# Patient Record
Sex: Male | Born: 1940 | Race: White | Hispanic: No | Marital: Married | Smoking: Former smoker
Health system: Southern US, Community
[De-identification: ages and names within clinical notes are randomized; demographics above are authoritative.]

## PROBLEM LIST (undated history)

## (undated) DIAGNOSIS — R0600 Dyspnea, unspecified: Secondary | ICD-10-CM

## (undated) DIAGNOSIS — M1990 Unspecified osteoarthritis, unspecified site: Secondary | ICD-10-CM

## (undated) DIAGNOSIS — R238 Other skin changes: Secondary | ICD-10-CM

## (undated) DIAGNOSIS — C801 Malignant (primary) neoplasm, unspecified: Secondary | ICD-10-CM

## (undated) DIAGNOSIS — E785 Hyperlipidemia, unspecified: Secondary | ICD-10-CM

## (undated) DIAGNOSIS — Z8619 Personal history of other infectious and parasitic diseases: Secondary | ICD-10-CM

## (undated) DIAGNOSIS — J449 Chronic obstructive pulmonary disease, unspecified: Secondary | ICD-10-CM

## (undated) DIAGNOSIS — K5792 Diverticulitis of intestine, part unspecified, without perforation or abscess without bleeding: Secondary | ICD-10-CM

## (undated) DIAGNOSIS — R233 Spontaneous ecchymoses: Secondary | ICD-10-CM

## (undated) HISTORY — PX: TONSILLECTOMY: SUR1361

## (undated) HISTORY — DX: Diverticulitis of intestine, part unspecified, without perforation or abscess without bleeding: K57.92

## (undated) HISTORY — DX: Hyperlipidemia, unspecified: E78.5

## (undated) HISTORY — DX: Chronic obstructive pulmonary disease, unspecified: J44.9

## (undated) HISTORY — DX: Personal history of other infectious and parasitic diseases: Z86.19

## (undated) HISTORY — PX: WISDOM TOOTH EXTRACTION: SHX21

---

## 2005-04-06 ENCOUNTER — Ambulatory Visit: Payer: Self-pay

## 2009-12-04 ENCOUNTER — Inpatient Hospital Stay (HOSPITAL_COMMUNITY): Admission: EM | Admit: 2009-12-04 | Discharge: 2009-12-06 | Payer: Self-pay | Admitting: Emergency Medicine

## 2009-12-06 ENCOUNTER — Encounter (INDEPENDENT_AMBULATORY_CARE_PROVIDER_SITE_OTHER): Payer: Self-pay | Admitting: Internal Medicine

## 2009-12-06 ENCOUNTER — Ambulatory Visit: Payer: Self-pay | Admitting: Vascular Surgery

## 2010-11-23 LAB — BLOOD GAS, ARTERIAL
Acid-Base Excess: 2.9 mmol/L — ABNORMAL HIGH (ref 0.0–2.0)
O2 Saturation: 93.6 %
Patient temperature: 98.6
TCO2: 28.4 mmol/L (ref 0–100)
pH, Arterial: 7.419 (ref 7.350–7.450)
pO2, Arterial: 61.4 mmHg — ABNORMAL LOW (ref 80.0–100.0)

## 2010-11-23 LAB — DIFFERENTIAL
Basophils Absolute: 0 10*3/uL (ref 0.0–0.1)
Basophils Relative: 1 % (ref 0–1)
Eosinophils Absolute: 0.2 10*3/uL (ref 0.0–0.7)
Eosinophils Relative: 4 % (ref 0–5)
Lymphocytes Relative: 28 % (ref 12–46)
Lymphocytes Relative: 31 % (ref 12–46)
Lymphs Abs: 1.7 10*3/uL (ref 0.7–4.0)
Lymphs Abs: 1.8 10*3/uL (ref 0.7–4.0)
Monocytes Absolute: 0.7 10*3/uL (ref 0.1–1.0)
Monocytes Absolute: 0.8 10*3/uL (ref 0.1–1.0)
Monocytes Relative: 14 % — ABNORMAL HIGH (ref 3–12)
Neutro Abs: 3.6 10*3/uL (ref 1.7–7.7)
Neutrophils Relative %: 51 % (ref 43–77)
Neutrophils Relative %: 58 % (ref 43–77)

## 2010-11-23 LAB — COMPREHENSIVE METABOLIC PANEL
Alkaline Phosphatase: 97 U/L (ref 39–117)
CO2: 26 mEq/L (ref 19–32)
Total Bilirubin: 1 mg/dL (ref 0.3–1.2)
Total Protein: 7.9 g/dL (ref 6.0–8.3)

## 2010-11-23 LAB — CBC
HCT: 54 % — ABNORMAL HIGH (ref 39.0–52.0)
Hemoglobin: 16.5 g/dL (ref 13.0–17.0)
MCHC: 34.3 g/dL (ref 30.0–36.0)
MCV: 91.4 fL (ref 78.0–100.0)
Platelets: 249 10*3/uL (ref 150–400)
RBC: 5.93 MIL/uL — ABNORMAL HIGH (ref 4.22–5.81)
RDW: 13.6 % (ref 11.5–15.5)

## 2010-11-23 LAB — CK TOTAL AND CKMB (NOT AT ARMC): CK, MB: 2.7 ng/mL (ref 0.3–4.0)

## 2010-11-23 LAB — LEUKOCYTE ALKALINE PHOSPHATASE: Leukocyte Alkaline  Phos Stain: 128 — ABNORMAL HIGH (ref 22–124)

## 2010-11-23 LAB — APTT: aPTT: 34 seconds (ref 24–37)

## 2010-11-23 LAB — RAPID URINE DRUG SCREEN, HOSP PERFORMED
Opiates: NOT DETECTED
Tetrahydrocannabinol: NOT DETECTED

## 2011-06-28 LAB — PULMONARY FUNCTION TEST

## 2011-12-07 ENCOUNTER — Encounter: Payer: Self-pay | Admitting: Internal Medicine

## 2011-12-08 ENCOUNTER — Encounter: Payer: Self-pay | Admitting: Internal Medicine

## 2011-12-08 ENCOUNTER — Ambulatory Visit (INDEPENDENT_AMBULATORY_CARE_PROVIDER_SITE_OTHER)
Admission: RE | Admit: 2011-12-08 | Discharge: 2011-12-08 | Disposition: A | Discharge status: Home / Self Care | Payer: Medicare Other | Source: Ambulatory Visit | Attending: Internal Medicine | Admitting: Internal Medicine

## 2011-12-08 ENCOUNTER — Ambulatory Visit (INDEPENDENT_AMBULATORY_CARE_PROVIDER_SITE_OTHER): Payer: Medicare Other | Admitting: Internal Medicine

## 2011-12-08 VITALS — BP 142/70 | HR 62 | Temp 97.7°F | Ht 66.0 in | Wt 179.0 lb

## 2011-12-08 DIAGNOSIS — J449 Chronic obstructive pulmonary disease, unspecified: Secondary | ICD-10-CM | POA: Insufficient documentation

## 2011-12-08 DIAGNOSIS — R0609 Other forms of dyspnea: Secondary | ICD-10-CM

## 2011-12-08 DIAGNOSIS — R0989 Other specified symptoms and signs involving the circulatory and respiratory systems: Secondary | ICD-10-CM

## 2011-12-08 DIAGNOSIS — R0600 Dyspnea, unspecified: Secondary | ICD-10-CM

## 2011-12-08 NOTE — Assessment & Plan Note (Addendum)
-   Spirometry 06/18/11 FEV1 1.27 (52%) ratio 40   So he is a very symptomatic GOLD II who has already failed all the usual meds for copd  DDX of  difficult airways managment all start with A and  include Adherence, Ace Inhibitors, Acid Reflux, Active Sinus Disease, Alpha 1 Antitripsin deficiency, Anxiety masquerading as Airways dz,  ABPA,  allergy(esp in young), Aspiration (esp in elderly), Adverse effects of DPI,  Active smokers, plus two Bs  = Bronchiectasis and Beta blocker use..and one C= CHF  Adherence is always the initial "prime suspect" and is a multilayered concern that requires a "trust but verify" approach in every patient - starting with knowing how to use medications, especially inhalers, correctly, keeping up with refills and understanding the fundamental difference between maintenance and prns vs those medications only taken for a very short course and then stopped and not refilled.   ? Acid reflux > rx empirically for now then regroup and consider rehab vs cpst first

## 2011-12-08 NOTE — Progress Notes (Signed)
  Subjective:    Patient ID: Marc Flores, male    DOB: 26-Mar-1941  MRN: [id number]  HPI  69 yowm quit smoking 1992 on arrival to [place] at wt 150-160  from [place] with no trouble at all with respiratory problems and new onset sob 2006 referred by Dr Cyndia Bent for eval of sob 12/08/2011 to pulmonary clinic.   12/08/2011 1st pulmonary ov gradual worsening doe indolent onset progressively worsex 6 years to point where may be a couple hundred feet or up the driveway to the house and has to stop at top to rest,  and no better with dulera and spiriva or proare but assoc with audible wheezing, no overt hb.  Sleeping ok without nocturnal  or early am exacerbation  of respiratory  c/o's or need for noct saba. Also denies any obvious fluctuation of symptoms with weather or environmental changes or other aggravating or alleviating factors except as outlined above   Previous w/u by Wardell Heath for cough, denied sob per notes, with FEV1 1.27 on 06/28/11 with ratio 40  Review of Systems  Constitutional: Negative for fever and unexpected weight change.  HENT: Positive for sore throat and rhinorrhea. Negative for ear pain, nosebleeds, congestion, sneezing, trouble swallowing, dental problem, postnasal drip and sinus pressure.   Eyes: Negative for redness and itching.  Respiratory: Positive for shortness of breath and wheezing. Negative for cough and chest tightness.   Cardiovascular: Negative for palpitations and leg swelling.  Gastrointestinal: Negative for nausea and vomiting.  Genitourinary: Negative for dysuria.  Musculoskeletal: Negative for joint swelling.  Skin: Negative for rash.  Neurological: Negative for headaches.  Hematological: Does not bruise/bleed easily.  Psychiatric/Behavioral: Negative for dysphoric mood. The patient is not nervous/anxious.        Objective:   Physical Exam  Obese pleasant amb wm nad Wt 179 12/08/2011 HEENT mild turbinate edema.  Oropharynx no thrush or excess pnd or  cobblestoning.  No JVD or cervical adenopathy. Mild accessory muscle hypertrophy. Trachea midline, nl thryroid. Chest was hyperinflated by percussion with diminished breath sounds and mild increased exp time without wheeze. Hoover sign positive at end inspiration. Regular rate and rhythm without murmur gallop or rub or increase P2 or edema.  Abd: mod obeseno hsm, nl excursion. Ext warm without cyanosis or clubbing.    CXR  12/08/2011 :      Assessment & Plan:

## 2011-12-08 NOTE — Patient Instructions (Addendum)
Please remember to go to the lab and x-ray department downstairs for your tests - we will call you with the results when they are available.    We will request lung function tests from Dr Geoffry Paradise office  If not revealing will consider going ahead with a cpst - we will call to schedule  Try prilosec 20mg   Take 30-60 min before first meal of the day and Pepcid 20 mg one bedtime until  We complete your work up  GERD (REFLUX)  is an extremely common cause of respiratory symptoms, many times with no significant heartburn at all.    It can be treated with medication, but also with lifestyle changes including avoidance of late meals, excessive alcohol, smoking cessation, and avoid fatty foods, chocolate, peppermint, colas, red wine, and acidic juices such as orange juice.  NO MINT OR MENTHOL PRODUCTS SO NO COUGH DROPS  USE SUGARLESS CANDY INSTEAD (jolley ranchers or Stover's)  NO OIL BASED VITAMINS - use powdered substitutes.

## 2011-12-08 NOTE — Assessment & Plan Note (Addendum)
Followed in Pulmonary clinic/ [facility]/ Annel Zunker     - 12/08/2011  Walked RA x 3 laps @ 185 ft each stopped due to  End of study, though sats dropped to 89%

## 2011-12-12 ENCOUNTER — Telehealth: Payer: Self-pay | Admitting: Internal Medicine

## 2011-12-12 NOTE — Telephone Encounter (Signed)
Notes Recorded by Nyoka Cowden, MD on 12/08/2011 at 4:39 PM Call pt: Reviewed cxr and no acute change so no change in recommendations made at ov   Pt aware of results and no questions or concerns at this time.

## 2011-12-12 NOTE — Progress Notes (Signed)
Quick Note:  Pt aware of results and no questions or concerns at this time. ______ 

## 2015-12-07 DIAGNOSIS — Z131 Encounter for screening for diabetes mellitus: Secondary | ICD-10-CM | POA: Diagnosis not present

## 2015-12-07 DIAGNOSIS — Z125 Encounter for screening for malignant neoplasm of prostate: Secondary | ICD-10-CM | POA: Diagnosis not present

## 2015-12-07 DIAGNOSIS — Z0000 Encounter for general adult medical examination without abnormal findings: Secondary | ICD-10-CM | POA: Diagnosis not present

## 2015-12-07 DIAGNOSIS — R300 Dysuria: Secondary | ICD-10-CM | POA: Diagnosis not present

## 2015-12-07 DIAGNOSIS — Z23 Encounter for immunization: Secondary | ICD-10-CM | POA: Diagnosis not present

## 2015-12-07 DIAGNOSIS — Z136 Encounter for screening for cardiovascular disorders: Secondary | ICD-10-CM | POA: Diagnosis not present

## 2015-12-07 DIAGNOSIS — M109 Gout, unspecified: Secondary | ICD-10-CM | POA: Diagnosis not present

## 2016-02-11 DIAGNOSIS — E7800 Pure hypercholesterolemia, unspecified: Secondary | ICD-10-CM | POA: Diagnosis not present

## 2016-02-17 DIAGNOSIS — R309 Painful micturition, unspecified: Secondary | ICD-10-CM | POA: Diagnosis not present

## 2016-02-17 DIAGNOSIS — J069 Acute upper respiratory infection, unspecified: Secondary | ICD-10-CM | POA: Diagnosis not present

## 2016-02-17 DIAGNOSIS — R3912 Poor urinary stream: Secondary | ICD-10-CM | POA: Diagnosis not present

## 2016-02-23 DIAGNOSIS — N410 Acute prostatitis: Secondary | ICD-10-CM | POA: Diagnosis not present

## 2016-03-10 DIAGNOSIS — E871 Hypo-osmolality and hyponatremia: Secondary | ICD-10-CM | POA: Diagnosis not present

## 2016-11-16 DIAGNOSIS — H2513 Age-related nuclear cataract, bilateral: Secondary | ICD-10-CM | POA: Diagnosis not present

## 2016-11-16 DIAGNOSIS — H5203 Hypermetropia, bilateral: Secondary | ICD-10-CM | POA: Diagnosis not present

## 2016-12-15 DIAGNOSIS — R300 Dysuria: Secondary | ICD-10-CM | POA: Diagnosis not present

## 2016-12-15 DIAGNOSIS — R0602 Shortness of breath: Secondary | ICD-10-CM | POA: Diagnosis not present

## 2016-12-15 DIAGNOSIS — Z0000 Encounter for general adult medical examination without abnormal findings: Secondary | ICD-10-CM | POA: Diagnosis not present

## 2016-12-15 DIAGNOSIS — R3912 Poor urinary stream: Secondary | ICD-10-CM | POA: Diagnosis not present

## 2016-12-15 DIAGNOSIS — E785 Hyperlipidemia, unspecified: Secondary | ICD-10-CM | POA: Diagnosis not present

## 2016-12-15 DIAGNOSIS — R062 Wheezing: Secondary | ICD-10-CM | POA: Diagnosis not present

## 2016-12-18 ENCOUNTER — Other Ambulatory Visit: Payer: Self-pay | Admitting: Family Medicine

## 2016-12-18 ENCOUNTER — Telehealth: Payer: Self-pay

## 2016-12-18 ENCOUNTER — Ambulatory Visit
Admission: RE | Admit: 2016-12-18 | Discharge: 2016-12-18 | Disposition: A | Discharge status: Home / Self Care | Payer: PPO | Source: Ambulatory Visit | Attending: Family Medicine | Admitting: Family Medicine

## 2016-12-18 DIAGNOSIS — R0602 Shortness of breath: Secondary | ICD-10-CM | POA: Diagnosis not present

## 2016-12-18 DIAGNOSIS — R918 Other nonspecific abnormal finding of lung field: Secondary | ICD-10-CM | POA: Diagnosis not present

## 2016-12-18 DIAGNOSIS — K625 Hemorrhage of anus and rectum: Secondary | ICD-10-CM | POA: Diagnosis not present

## 2016-12-18 DIAGNOSIS — Z86010 Personal history of colonic polyps: Secondary | ICD-10-CM | POA: Diagnosis not present

## 2016-12-18 NOTE — Telephone Encounter (Signed)
SENT NOTES TO SCHEDULING 

## 2016-12-19 ENCOUNTER — Encounter: Payer: Self-pay | Admitting: Cardiovascular Disease

## 2016-12-19 NOTE — Progress Notes (Signed)
Cardiology Office Note   Date:  12/20/2016   ID:  Marc Flores, DOB Nov 06, 1940, MRN [id number]  PCP:  Chesley Noon, MD  Cardiologist:   Jenkins Rouge, MD   Chief Complaint  Patient presents with  . Shortness of Breath  . Establish Care      History of Present Illness: Marc Flores is a 76 y.o. male who presents for evaluation/consultation of exertional dyspnea.  Referred by Dr London Pepper. Reviewed office note from 12/15/16  Notes "heavy breathing" Previous smoker. Has occasional wheezing and congestion Can't work more than 2 minutes without getting winded. No chest pain. No edema Smoked a ppd from age 64 to Erma checked and albuterol given  NAD on CXR personally reviewed images   Labs reviewed LDL 101 CR 1.34  Hct 50.9   He has had marked change in his ambient dyspnea last 6 months. Cough and congestion. Walked in office and sats stayed above 90%.  No chest pain No palpitations although resting HR is tachycardic. No LE edema Clotting issues or history of PE   Past Medical History:  Diagnosis Date  . COPD (chronic obstructive pulmonary disease) ([place])     Past Surgical History:  Procedure Laterality Date  . TONSILLECTOMY    . WISDOM TOOTH EXTRACTION       Current Outpatient Prescriptions  Medication Sig Dispense Refill  . atorvastatin (LIPITOR) 10 MG tablet Take 10 mg by mouth daily.  6  . Mometasone Furo-Formoterol Fum (DULERA) 200-5 MCG/ACT AERO Inhale 2 puffs into the lungs 2 (two) times daily.    Marland Kitchen PROAIR HFA 108 (90 Base) MCG/ACT inhaler Inhale 2 puffs into the lungs as directed.  1  . tiotropium (SPIRIVA) 18 MCG inhalation capsule Place 18 mcg into inhaler and inhale daily.     No current facility-administered medications for this visit.     Allergies:   Patient has no known allergies.    Social History:  The patient  reports that he quit smoking about 25 years ago. His smoking use included Cigarettes. He has a 30.00 pack-year smoking  history. He has never used smokeless tobacco. He reports that he drinks alcohol. He reports that he does not use drugs.   Family History:  The patient's family history includes Atrial fibrillation in his father; Diabetes in his mother; Glaucoma in his mother; Heart disease in his father.    ROS:  Please see the history of present illness.   Otherwise, review of systems are positive for none.   All other systems are reviewed and negative.    PHYSICAL EXAM: VS:  BP (!) 150/64   Pulse (!) 107   Ht 5\' 7"  (1.702 m)   Wt 176 lb 6.4 oz (80 kg)   BMI 27.63 kg/m  , BMI Body mass index is 27.63 kg/m. Affect appropriate Overweight tachypnic male  HEENT: normal Neck supple with no adenopathy JVP normal no bruits no thyromegaly Lungs basilar velcro like crackles  no wheezing and good diaphragmatic motion Heart:  S1/S2 no murmur, no rub, gallop or click PMI normal Abdomen: benighn, BS positve, no tenderness, no AAA no bruit.  No HSM or HJR Distal pulses intact with no bruits No edema Neuro non-focal Skin warm and dry No muscular weakness    EKG:  ST rate 104 LAD ? Old IPMI    Recent Labs: No results found for requested labs within last 8760 hours.    Lipid Panel No results found for:  CHOL, TRIG, HDL, CHOLHDL, VLDL, LDLCALC, LDLDIRECT    Wt Readings from Last 3 Encounters:  12/20/16 176 lb 6.4 oz (80 kg)  12/08/11 179 lb (81.2 kg)      Other studies Reviewed: Additional studies/ records that were reviewed today include: Notes Dr Melvyn Novas PFTls 2015  Office notes ECG primary .    ASSESSMENT AND PLAN:  1. Dyspnea: Cardiac exam is normal. ECG abnormal with resting tachycardia. Will order echo to assess RV/LV function and assess PA pressures r/o pulmonary hypertension. He has known COPD. Concern on exam is for ILD or UIP. Velcro like crackles at base. Will start with hi resolution CT scan, refer back to Dr Melvyn Novas pulmonary and echo. If he has normal systolic function will need  PFTls, ABG and CTA or V/Q to r/o PE.  Relatively high Hct also speaks toward acute on chronic lung disease and not cardiac etiology  2. Elevated cholesterol  On statin labs with primary    Current medicines are reviewed at length with the patient today.  The patient does not have concerns regarding medicines.  The following changes have been made:  no change  Labs/ tests ordered today include: Echo  Hi Resolution CT Scan refer to pulmonary   Orders Placed This Encounter  Procedures  . CT CHEST HIGH RESOLUTION  . Ambulatory referral to Pulmonology  . EKG 12-Lead  . ECHOCARDIOGRAM COMPLETE     Disposition:   FU with me next available      Signed, Jenkins Rouge, MD  12/20/2016 4:33 PM    [facility] [address], [place], [place]  [address] Phone: [phone]; Fax: [phone]

## 2016-12-20 ENCOUNTER — Encounter: Payer: Self-pay | Admitting: Cardiovascular Disease

## 2016-12-20 ENCOUNTER — Ambulatory Visit (INDEPENDENT_AMBULATORY_CARE_PROVIDER_SITE_OTHER): Payer: PPO | Admitting: Cardiovascular Disease

## 2016-12-20 VITALS — BP 150/64 | HR 107 | Ht 67.0 in | Wt 176.4 lb

## 2016-12-20 DIAGNOSIS — R0602 Shortness of breath: Secondary | ICD-10-CM | POA: Diagnosis not present

## 2016-12-20 DIAGNOSIS — J849 Interstitial pulmonary disease, unspecified: Secondary | ICD-10-CM | POA: Diagnosis not present

## 2016-12-20 DIAGNOSIS — Z7689 Persons encountering health services in other specified circumstances: Secondary | ICD-10-CM | POA: Diagnosis not present

## 2016-12-20 NOTE — Patient Instructions (Addendum)
Medication Instructions:  Your physician recommends that you continue on your current medications as directed. Please refer to the Current Medication list given to you today.  Labwork: NONE  Testing/Procedures: Your physician has requested that you have an echocardiogram as soon as possible with in the next week. Echocardiography is a painless test that uses sound waves to create images of your heart. It provides your doctor with information about the size and shape of your heart and how well your heart's chambers and valves are working. This procedure takes approximately one hour. There are no restrictions for this procedure.  Chest CT scanning as soon as possible with in the next week, (CAT scanning), is a noninvasive, special x-ray that produces cross-sectional images of the body using x-rays and a computer. CT scans help physicians diagnose and treat medical conditions. For some CT exams, a contrast material is used to enhance visibility in the area of the body being studied. CT scans provide greater clarity and reveal more details than regular x-ray exams.  Follow-Up: You have been referred to Pulmonology with Dr. Melvyn Novas in the next two weeks.   Your physician wants you to follow-up in: 6 months with Dr. Johnsie Cancel. You will receive a reminder letter in the mail two months in advance. If you don't receive a letter, please call our office to schedule the follow-up appointment.   If you need a refill on your cardiac medications before your next appointment, please call your pharmacy.

## 2016-12-21 ENCOUNTER — Ambulatory Visit (HOSPITAL_COMMUNITY): Payer: PPO | Attending: Cardiology

## 2016-12-21 ENCOUNTER — Other Ambulatory Visit: Payer: Self-pay

## 2016-12-21 DIAGNOSIS — J449 Chronic obstructive pulmonary disease, unspecified: Secondary | ICD-10-CM | POA: Diagnosis not present

## 2016-12-21 DIAGNOSIS — Z87891 Personal history of nicotine dependence: Secondary | ICD-10-CM | POA: Diagnosis not present

## 2016-12-21 DIAGNOSIS — R0602 Shortness of breath: Secondary | ICD-10-CM | POA: Diagnosis not present

## 2016-12-21 LAB — ECHOCARDIOGRAM COMPLETE
AOASC: 31 cm
CHL CUP DOP CALC LVOT VTI: 16.4 cm
CHL CUP MV DEC (S): 349
EERAT: 4.89
EWDT: 349 ms
FS: 35 % (ref 28–44)
IV/PV OW: 1.11
LA diam end sys: 32 mm
LA vol: 36.7 mL
LADIAMINDEX: 1.67 cm/m2
LASIZE: 32 mm
LAVOLA4C: 31.6 mL
LAVOLIN: 19.1 mL/m2
LV E/e' medial: 4.89
LV E/e'average: 4.89
LV PW d: 9 mm — AB (ref 0.6–1.1)
LVELAT: 10.3 cm/s
LVOT SV: 51 mL
LVOT area: 3.14 cm2
LVOT diameter: 20 mm
LVOT peak vel: 85 cm/s
MV pk E vel: 50.4 m/s
MVAP: 2.16 cm2
MVPKAVEL: 76.2 m/s
MVSPHT: 102 ms
RV LATERAL S' VELOCITY: 8.6 cm/s
TAPSE: 19.5 mm
TDI e' lateral: 10.3
TDI e' medial: 8.19

## 2016-12-28 ENCOUNTER — Ambulatory Visit (INDEPENDENT_AMBULATORY_CARE_PROVIDER_SITE_OTHER)
Admission: RE | Admit: 2016-12-28 | Discharge: 2016-12-28 | Disposition: A | Discharge status: Home / Self Care | Payer: PPO | Source: Ambulatory Visit | Attending: Cardiovascular Disease | Admitting: Cardiovascular Disease

## 2016-12-28 ENCOUNTER — Telehealth: Payer: Self-pay

## 2016-12-28 DIAGNOSIS — J849 Interstitial pulmonary disease, unspecified: Secondary | ICD-10-CM | POA: Diagnosis not present

## 2016-12-28 DIAGNOSIS — R0602 Shortness of breath: Secondary | ICD-10-CM | POA: Diagnosis not present

## 2016-12-28 DIAGNOSIS — R9389 Abnormal findings on diagnostic imaging of other specified body structures: Secondary | ICD-10-CM

## 2016-12-28 NOTE — Telephone Encounter (Signed)
-----   Message from Josue Hector, MD sent at 12/28/2016 11:38 AM EDT ----- No ILD but exam abnormal and significant COPD on CT should have f/u with pulmonary has seen Wert before had him get lexiscan myovue and d dimer as his acute symptoms still not explained

## 2016-12-28 NOTE — Telephone Encounter (Signed)
Patient aware of results. Dr. Johnsie Cancel spoke with patient on the phone. Will order test. Patient will come in tomorrow for lab work. [facility] will call patient to schedule Myoview.

## 2016-12-29 ENCOUNTER — Encounter: Payer: Self-pay | Admitting: Internal Medicine

## 2016-12-29 ENCOUNTER — Ambulatory Visit (INDEPENDENT_AMBULATORY_CARE_PROVIDER_SITE_OTHER): Payer: PPO | Admitting: Internal Medicine

## 2016-12-29 ENCOUNTER — Other Ambulatory Visit: Payer: PPO

## 2016-12-29 ENCOUNTER — Other Ambulatory Visit (INDEPENDENT_AMBULATORY_CARE_PROVIDER_SITE_OTHER): Payer: PPO

## 2016-12-29 VITALS — BP 138/82 | HR 93 | Ht 63.5 in | Wt 177.0 lb

## 2016-12-29 DIAGNOSIS — R0609 Other forms of dyspnea: Secondary | ICD-10-CM

## 2016-12-29 DIAGNOSIS — J449 Chronic obstructive pulmonary disease, unspecified: Secondary | ICD-10-CM

## 2016-12-29 DIAGNOSIS — R0600 Dyspnea, unspecified: Secondary | ICD-10-CM

## 2016-12-29 DIAGNOSIS — J479 Bronchiectasis, uncomplicated: Secondary | ICD-10-CM | POA: Insufficient documentation

## 2016-12-29 LAB — BRAIN NATRIURETIC PEPTIDE: Pro B Natriuretic peptide (BNP): 20 pg/mL (ref 0.0–100.0)

## 2016-12-29 MED ORDER — GLYCOPYRROLATE-FORMOTEROL 9-4.8 MCG/ACT IN AERO: 2.0000 | INHALATION_SPRAY | Freq: Two times a day (BID) | RESPIRATORY_TRACT | 0 refills | Status: DC

## 2016-12-29 MED ORDER — AMOXICILLIN-POT CLAVULANATE 875-125 MG PO TABS: 1.0000 | ORAL_TABLET | Freq: Two times a day (BID) | ORAL | 0 refills | Status: AC

## 2016-12-29 NOTE — Assessment & Plan Note (Addendum)
-   Spirometry 06/18/11 FEV1 1.27 (52%) ratio 40  - Spirometry 12/29/2016  FEV1 0.78 (34%)  Ratio 37   - 12/29/2016  After extensive coaching HFA effectiveness =    50% from baseline of 25% rec trial of bevesopi 2bid   Despite smoking cessation and absence of wt gain he has had significant decline with symptoms now difficult to control.  DDX of  difficult airways management almost all start with A and  include Adherence, Ace Inhibitors, Acid Reflux, Active Sinus Disease, Alpha 1 Antitripsin deficiency, Anxiety masquerading as Airways dz,  ABPA,  Allergy(esp in young), Aspiration (esp in elderly), Adverse effects of meds,  Active smokers, A bunch of PE's (a small clot burden can't cause this syndrome unless there is already severe underlying pulm or vascular dz with poor reserve) plus two Bs  = Bronchiectasis and Beta blocker use..and one C= CHF    Adherence is always the initial "prime suspect" and is a multilayered concern that requires a "trust but verify" approach in every patient - starting with knowing how to use medications, especially inhalers, correctly, keeping up with refills and understanding the fundamental difference between maintenance and prns vs those medications only taken for a very short course and then stopped and not refilled.  - see hfa teaching - return with all meds in hand using a trust but verify approach to confirm accurate Medication  Reconciliation The principal here is that until we are certain that the  patients are doing what we've asked, it makes no sense to ask them to do more.    ? Acid (or non-acid) GERD > always difficult to exclude as up to 75% of pts in some series report no assoc GI/ Heartburn symptoms> rec max (24h)  acid suppression and diet restrictions/ reviewed and instructions given in writing.  ? Active sinus dz > augmentin then sinus ct to follow if mucus still green    ? Allergy/ asthma > doubt so ok to rx with laba/lama trial for now and if flares on  not improving change to lama/laba/ics next ov and in meantime send profile    ? Alpha one AT def > send phenotype/ levels  ? A bunch of PE's > D dimer nl - while  A nl valute  may miss small peripheral pe, the clot burden with sob is moderately high and the d dimer has a very high neg pred value in this setting    ? Bronchiectasis confirmed on CT > rx augmentin/ check alpha one status ? Add flutter next     ? chf > excluded with bnp so low   Total time devoted to counseling  > 50 % of re-establish 60 min office visit:  review case with pt/ discussion of options/alternatives/ personally creating written customized instructions  in presence of pt  then going over those specific  Instructions directly with the pt including how to use all of the meds but in particular covering each new medication in detail and the difference between the maintenance= "automatic" meds and the prns using an action plan format for the latter (If this problem/symptom => do that organization reading Left to right).  Please see AVS from this visit for a full list of these instructions which I personally wrote for this pt and  are unique to this visit.

## 2016-12-29 NOTE — Patient Instructions (Addendum)
Plan A = Automatic = Bevespi Take 2 puffs first thing in am and then another 2 puffs about 12 hours later.  Also start taking  prilosec otc 20mg   Take 30-60 min before first meal of the day and Pepcid ac (famotidine) 20 mg one @  bedtime until  Return        Work on inhaler technique:  relax and gently blow all the way out then take a nice smooth deep breath back in, triggering the inhaler at same time you start breathing in.  Hold for up to 5 seconds if you can. Blow out thru nose. Rinse and gargle with water when done      Plan B = Backup Only use your albuterol as a rescue medication to be used if you can't catch your breath by resting or doing a relaxed purse lip breathing pattern.  - The less you use it, the better it will work when you need it. - Ok to use the inhaler up to 2 puffs  every 4 hours if you must but call for appointment if use goes up over your usual need - Don't leave home without it !!  (think of it like the spare tire for your car)    Augmentin 875 mg take one pill twice daily  X 10 days - take at breakfast and supper with large glass of water.  It would help reduce the usual side effects (diarrhea and yeast infections) if you ate cultured yogurt at lunch.   Please remember to go to the lab  department downstairs in the basement  for your tests - we will call you with the results when they are available.    Please schedule a follow up office visit in 4 weeks, sooner if needed  with all medications /inhalers/ solutions in hand so we can verify exactly what you are taking. This includes all medications from all doctors and over the counters

## 2016-12-29 NOTE — Progress Notes (Signed)
Subjective:    Patient ID: Marc Flores, male    DOB: [birth year]  MRN: [id number]    Brief patient profile:  76yowm quit smoking 1992 on arrival to [place] at wt 150-160  from [place] with no trouble at all with respiratory problems and new onset sob 2006 referred by Dr Melford Aase for eval of sob 12/08/2011 to pulmonary clinic with confirmed GOLD II copd by spirometry on 06/2011 pre rx by  [facility] chest     History of Present Illness  12/08/2011 1st pulmonary ov gradual worsening doe indolent onset progressively worsex 6 years to point where may be a couple hundred feet or up the driveway to the house and has to stop at top to rest,  and no better with dulera and spiriva or proare but assoc with audible wheezing, no overt hb. rec If not revealing will consider going ahead with a cpst - we will call to schedule Try prilosec 20mg   Take 30-60 min before first meal of the day and Pepcid 20 mg one bedtime until  We complete your work up GERD diet     12/29/2016  f/u ov/Pariss Hommes re:  Girtha Rm II copd/ worse sob with new dx of bronchiectasis  Chief Complaint  Patient presents with  . Pulmonary Consult    Referred by Dr. Johnsie Cancel. Pt c/o DOE with exertion such as walking up hills and carrying out the garbage. He states he has had SOB for years, but this has been worse for 6 months. He also c/o prod cough with light green sputum.     all better / able to get up driveway fine p last pulmonary eval and eventually stopped the gerd rx got worse under [facility] f/u > pulmonary eval [facility] dx COPD rec inhalers > better transiently but worse x 6 m to point where can't get up same driveway without stopping half way - saba still helps some  Mucus slt green x 6 m x 10 min each am ssoc with some pnds  Watery nasal discharge x years        No obvious day to day or daytime variability or assoc  mucus plugs or hemoptysis or cp or chest tightness, subjective wheeze or overt sinus or hb symptoms. No unusual exp hx or h/o childhood pna/ asthma  or knowledge of premature birth.  Sleeping ok without nocturnal  or early am exacerbation  of respiratory  c/o's or need for noct saba. Also denies any obvious fluctuation of symptoms with weather or environmental changes or other aggravating or alleviating factors except as outlined above   Current Medications, Allergies, Complete Past Medical History, Past Surgical History, Family History, and Social History were reviewed in [employer] record.  ROS  The following are not active complaints unless bolded sore throat, dysphagia, dental problems, itching, sneezing,  nasal congestion or excess/ purulent secretions, ear ache,   fever, chills, sweats, unintended wt loss, classically pleuritic or exertional cp,  orthopnea pnd or leg swelling, presyncope, palpitations, abdominal pain, anorexia, nausea, vomiting, diarrhea  or change in bowel or bladder habits, change in stools or urine, dysuria,hematuria,  rash, arthralgias, visual complaints, headache, numbness, weakness or ataxia or problems with walking or coordination,  change in mood/affect or memory.                  Objective:   Physical Exam  Obese pleasant amb wm nad  Wt 179 12/08/2011 Wt Readings from Last 3 Encounters:  12/29/16 177 lb (80.3 kg)  12/20/16 176 lb 6.4 oz (80 kg)  12/08/11 179 lb (81.2 kg)    Vital signs reviewed - Note on arrival 02 sats  93% on RA    HEENT: nl dentition, and oropharynx. Nl external ear canals without cough reflex - moderate bilateral L > Rnon-specific turbinate edema     NECK :  without JVD/Nodes/TM/ nl carotid upstrokes bilaterally   LUNGS: no acc muscle use,  Nl contour chest with minimal insp and exp rhonchi bilaterally  CV:  RRR  no s3 or murmur or increase in P2, and no edema   ABD:  soft and nontender with pos hoovers' mid insp  in the supine position. No bruits or organomegaly appreciated, bowel sounds nl  MS:  Nl gait/ ext warm without deformities, calf  tenderness, cyanosis or clubbing No obvious joint restrictions   SKIN: warm and dry without lesions    NEURO:  alert, approp, nl sensorium with  no motor or cerebellar deficits apparent.      I personally reviewed images and agree with radiology impression as follows:  CT chest 12/28/16 1. No evidence of interstitial lung disease. 2. Diffuse bronchial wall thickening with moderate centrilobular and mild paraseptal emphysema; imaging findings compatible with the reported clinical history of COPD. 3. 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   Labs ordered/ reviewed:      Chemistry      Component Value Date/Time   NA 139 12/04/2009 1144   K 4.1 12/04/2009 1144   CL 103 12/04/2009 1144   CO2 26 12/04/2009 1144   BUN 11 12/04/2009 1144   CREATININE 1.10 12/04/2009 1144      Component Value Date/Time   CALCIUM 9.4 12/04/2009 1144   ALKPHOS 97 12/04/2009 1144   AST 27 12/04/2009 1144   ALT 32 12/04/2009 1144   BILITOT 1.0 12/04/2009 1144        Lab Results  Component Value Date   WBC 5.9 12/05/2009   HGB 16.5 12/05/2009   HCT 48.8 12/05/2009   MCV 91.4 12/05/2009   PLT 249 12/05/2009     Lab Results  Component Value Date   DDIMER 0.42 12/29/2016          Lab Results  Component Value Date   PROBNP 20.0 12/29/2016               Assessment & Plan:

## 2016-12-30 LAB — D-DIMER, QUANTITATIVE (NOT AT ARMC): D DIMER QUANT: 0.42 ug{FEU}/mL (ref <0.50)

## 2016-12-30 NOTE — Assessment & Plan Note (Signed)
No evidence anemia/ chf > see copd

## 2017-01-01 LAB — ALPHA-1-ANTITRYPSIN: A-1 Antitrypsin, Ser: 149 mg/dL (ref 83–199)

## 2017-01-01 LAB — RESPIRATORY ALLERGY PROFILE REGION II ~~LOC~~
Allergen, A. alternata, m6: <0.1 kU/L
Allergen, C. Herbarum, M2: <0.1 kU/L
Allergen, D pternoyssinus,d7: <0.1 kU/L
Allergen, Mulberry, t76: <0.1 kU/L
Aspergillus fumigatus, m3: <0.1 kU/L
Cockroach: <0.1 kU/L
Common Ragweed: <0.1 kU/L
D. farinae: <0.1 kU/L
IGE (IMMUNOGLOBULIN E), SERUM: 16 kU/L (ref <115)
Johnson Grass: <0.1 kU/L
Rough Pigweed  IgE: <0.1 kU/L
Sheep Sorrel IgE: <0.1 kU/L

## 2017-01-04 LAB — ALPHA-1 ANTITRYPSIN PHENOTYPE: A-1 Antitrypsin: 152 mg/dL (ref 83–199)

## 2017-01-05 ENCOUNTER — Telehealth: Payer: Self-pay | Admitting: Internal Medicine

## 2017-01-05 NOTE — Telephone Encounter (Signed)
Call patient : Studies are unremarkable, no change in recs  Called and spoke with pts wife and she is aware of recs. Nothing further is needed.

## 2017-01-05 NOTE — Progress Notes (Signed)
LMTCB

## 2017-01-09 ENCOUNTER — Telehealth (HOSPITAL_COMMUNITY): Payer: Self-pay | Admitting: *Deleted

## 2017-01-09 NOTE — Telephone Encounter (Signed)
Attempted to call patient to review instructions for upcoming appointment- patient stated that he will call back tomorrow to review instructions.  Kirstie Peri

## 2017-01-09 NOTE — Progress Notes (Signed)
See 01/05/17 PN

## 2017-01-12 ENCOUNTER — Ambulatory Visit (HOSPITAL_COMMUNITY): Payer: PPO | Attending: Cardiology

## 2017-01-12 DIAGNOSIS — R9389 Abnormal findings on diagnostic imaging of other specified body structures: Secondary | ICD-10-CM

## 2017-01-12 DIAGNOSIS — R938 Abnormal findings on diagnostic imaging of other specified body structures: Secondary | ICD-10-CM

## 2017-01-12 DIAGNOSIS — R0602 Shortness of breath: Secondary | ICD-10-CM

## 2017-01-12 DIAGNOSIS — J449 Chronic obstructive pulmonary disease, unspecified: Secondary | ICD-10-CM | POA: Insufficient documentation

## 2017-01-12 MED ORDER — TECHNETIUM TC 99M TETROFOSMIN IV KIT
10.2000 | PACK | Freq: Once | INTRAVENOUS | Status: AC | PRN
  Administered 2017-01-12: 10.2 via INTRAVENOUS
  Filled 2017-01-12: qty 11

## 2017-01-12 MED ORDER — REGADENOSON 0.4 MG/5ML IV SOLN
0.4000 mg | Freq: Once | INTRAVENOUS | Status: AC
  Administered 2017-01-12: 0.4 mg via INTRAVENOUS

## 2017-01-12 MED ORDER — TECHNETIUM TC 99M TETROFOSMIN IV KIT
31.0000 | PACK | Freq: Once | INTRAVENOUS | Status: AC | PRN
  Administered 2017-01-12: 31 via INTRAVENOUS
  Filled 2017-01-12: qty 31

## 2017-01-15 LAB — MYOCARDIAL PERFUSION IMAGING
CHL CUP NUCLEAR SDS: 0
CHL CUP NUCLEAR SRS: 1
CHL CUP RESTING HR STRESS: 66 {beats}/min
CSEPPHR: 101 {beats}/min
LV dias vol: 69 mL (ref 62–150)
LVSYSVOL: 25 mL
RATE: 0.22
SSS: 1
TID: 0.93

## 2017-01-16 DIAGNOSIS — N289 Disorder of kidney and ureter, unspecified: Secondary | ICD-10-CM | POA: Diagnosis not present

## 2017-01-26 ENCOUNTER — Ambulatory Visit (INDEPENDENT_AMBULATORY_CARE_PROVIDER_SITE_OTHER): Payer: PPO | Admitting: Internal Medicine

## 2017-01-26 ENCOUNTER — Encounter: Payer: Self-pay | Admitting: Internal Medicine

## 2017-01-26 VITALS — BP 122/70 | HR 75 | Ht 63.5 in | Wt 176.0 lb

## 2017-01-26 DIAGNOSIS — J479 Bronchiectasis, uncomplicated: Secondary | ICD-10-CM

## 2017-01-26 DIAGNOSIS — J449 Chronic obstructive pulmonary disease, unspecified: Secondary | ICD-10-CM | POA: Diagnosis not present

## 2017-01-26 MED ORDER — GLYCOPYRROLATE-FORMOTEROL 9-4.8 MCG/ACT IN AERO
2.0000 | INHALATION_SPRAY | Freq: Two times a day (BID) | RESPIRATORY_TRACT | 0 refills | Status: DC
Start: 2017-01-26 — End: 2017-01-26

## 2017-01-26 MED ORDER — GLYCOPYRROLATE-FORMOTEROL 9-4.8 MCG/ACT IN AERO
2.0000 | INHALATION_SPRAY | Freq: Two times a day (BID) | RESPIRATORY_TRACT | 11 refills | Status: DC
Start: 2017-01-26 — End: 2017-01-26

## 2017-01-26 MED ORDER — GLYCOPYRROLATE-FORMOTEROL 9-4.8 MCG/ACT IN AERO: 2.0000 | INHALATION_SPRAY | Freq: Two times a day (BID) | RESPIRATORY_TRACT | 11 refills | Status: DC

## 2017-01-26 NOTE — Progress Notes (Signed)
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 rec If not revealing will consider going ahead with a cpst - we will call to schedule Try prilosec 20mg   Take 30-60 min before first meal of the day and Pepcid 20 mg one bedtime until  We complete your work up GERD diet     12/29/2016  f/u ov/Gracielynn Birkel re:  Girtha Rm II copd/ worse sob with new dx of bronchiectasis  Chief Complaint  Patient presents with  . Pulmonary Consult    Referred by Dr. Johnsie Cancel. Pt c/o DOE with exertion such as walking up hills and carrying out the garbage. He states he has had SOB for years, but this has been worse for 6 months. He also c/o prod cough with light green sputum.    all better / able to get up driveway fine p last pulmonary eval and eventually stopped the gerd rx got worse under [facility] f/u > pulmonary eval [facility] dx COPD rec inhalers > better transiently but worse x 6 m to point where can't get up same driveway without stopping half way - saba still helps some Mucus slt green x 6 m x 10 min each am ssoc with some pnds  Watery nasal discharge x years        01/26/2017  f/u ov/Devota Viruet re:  GOLD II copd/ bronchiectasis on bevespi 2bid/ rare saba  On otc gerd rx  Chief Complaint  Patient presents with  . Follow-up    Breathing has improved some but not back to  baseline. His cough had resolved and then returned after finished abx- prod with beige sputum.   mailbox to house s stopping now due to sob  No am exacs and sleeps fine  No obvious day to day or daytime variability or assoc excess/ purulent sputum or mucus plugs or hemoptysis or cp or chest tightness, subjective wheeze or overt sinus or hb symptoms. No unusual exp hx or h/o childhood pna/ asthma or knowledge of premature birth.  Sleeping ok without nocturnal  or early am exacerbation  of respiratory  c/o's or need for noct saba. Also denies any obvious fluctuation of symptoms with weather or environmental changes or other aggravating or alleviating factors except as outlined above   Current Medications, Allergies, Complete Past Medical History, Past Surgical History, Family History, and Social History were reviewed in [employer] record.  ROS  The following are not active complaints unless bolded sore throat, dysphagia, dental problems, itching, sneezing,  nasal congestion or excess/ purulent secretions, ear ache,   fever, chills, sweats, unintended wt loss, classically pleuritic or exertional cp,  orthopnea pnd or leg swelling, presyncope, palpitations, abdominal pain, anorexia, nausea, vomiting, diarrhea  or  change in bowel or bladder habits, change in stools or urine, dysuria,hematuria,  rash, arthralgias, visual complaints, headache, numbness, weakness or ataxia or problems with walking or coordination,  change in mood/affect or memory.        .            Objective:   Physical Exam  Obese pleasant amb wm nad    01/26/2017       176  12/29/16 177 lb (80.3 kg)  12/20/16 176 lb 6.4 oz (80 kg)  12/08/11 179 lb (81.2 kg)   Wt 179 12/08/2011    Vital signs reviewed - Note on arrival 02 sats  93% on RA    HEENT: nl dentition, and oropharynx. Nl external ear canals without cough reflex - moderate bilateral L > Rnon-specific turbinate edema     NECK :   without JVD/Nodes/TM/ nl carotid upstrokes bilaterally   LUNGS: no acc muscle use,  Nl contour chest with  Distant bs bilaterally  but no significant wheezes or rhonchi  CV:  RRR  no s3 or murmur or increase in P2, and no edema   ABD:  soft and nontender with pos hoovers' mid insp  in the supine position. No bruits or organomegaly appreciated, bowel sounds nl  MS:  Nl gait/ ext warm without deformities, calf tenderness, cyanosis or clubbing No obvious joint restrictions   SKIN: warm and dry without lesions    NEURO:  alert, approp, nl sensorium with  no motor or cerebellar deficits apparent.      I personally reviewed images and agree with radiology impression as follows:  CT chest 12/28/16 1. No evidence of interstitial lung disease. 2. Diffuse bronchial wall thickening with moderate centrilobular and mild paraseptal emphysema; imaging findings compatible with the reported clinical history of COPD. 3. 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            Assessment & Plan:

## 2017-01-26 NOTE — Assessment & Plan Note (Signed)
-   Spirometry 06/18/11 FEV1 1.27 (52%) ratio 40  - Spirometry 12/29/2016  FEV1 0.78 (34%)  Ratio 37  No saba first  - 12/29/2016  After extensive coaching HFA effectiveness =    50% from baseline of 50% rec trial of bevespi 2bid  - alpha one AT screen 12/29/16 >  MM - Allergy profile  12/29/16  >    IgE  16  RAST neg  - 01/26/2017  After extensive coaching HFA effectiveness =  50% from a baseline 25%    Much better symptom control despite suboptimal hfa but pt happy with present rx so no need to change it  I had an extended final summary discussion with the patient reviewing all relevant studies completed to date and  lasting 15 to 20 minutes of a 25 minute visit on the following issues:    1) pathophysiology of bronchiectasis reviewed (see separate a/p)   2) Formulary restrictions will be an ongoing challenge for the forseable future and I would be happy to pick an alternative if the pt will first  provide me a list of them but pt  will need to return here for training for any new device that is required eg dpi vs hfa vs respimat.    In meantime we can always provide samples so the patient never runs out of any needed respiratory medications.    3) Each maintenance medication was reviewed in detail including most importantly the difference between maintenance and as needed and under what circumstances the prns are to be used.  Please see AVS for specific  Instructions which are unique to this visit and I personally typed out  which were reviewed in detail in writing with the patient and a copy provided.   4) needs to keep up with vaccinations per dr Orland Mustard   pulmonary f/u can be prn

## 2017-01-26 NOTE — Assessment & Plan Note (Signed)
CT chest 12/28/16 1. No evidence of interstitial lung disease. 2. Diffuse bronchial wall thickening with moderate centrilobular and mild paraseptal emphysema; imaging findings compatible with the reported clinical history of COPD. 3. 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  rx mucinex or mucinex dm prn flare > see avs for instructions unique to this ov

## 2017-01-26 NOTE — Patient Instructions (Addendum)
Bronchiectasis =   you have scarring of your bronchial tubes which means that they don't function perfectly normally and mucus tends to pool in certain areas of your lung which can cause pneumonia and further scarring of your lung and bronchial tubes  Whenever you develop cough congestion take mucinex or mucinex dm  Up to 1200 mg every 12 hours as needed > these will help keep the mucus loose and flowing but if your condition worsens you need to seek help immediately preferably here or somewhere inside the [facility] system to compare xrays ( worse = darker or bloody mucus or pain on breathing in)     Plan A = Automatic =  Bevespi Take 2 puffs first thing in am and then another 2 puffs about 12 hours later.   Work on inhaler technique:  relax and gently blow all the way out then take a nice smooth deep breath back in, triggering the inhaler at same time you start breathing in.  Hold for up to 5 seconds if you can. Blow out thru nose. Rinse and gargle with water when done    Plan B = Backup Only use your albuterol as a rescue medication to be used if you can't catch your breath by resting or doing a relaxed purse lip breathing pattern.  - The less you use it, the better it will work when you need it. - Ok to use the inhaler up to 2 puffs  every 4 hours if you must but call for appointment if use goes up over your usual need - Don't leave home without it !!  (think of it like the spare tire for your car)   Ok to taper the acid suppression and just take the pepcid at bedtime as needed but at the first sign of a flare of respiratory symptoms remember Try prilosec otc 20mg   Take 30-60 min before first meal of the day and Pepcid ac (famotidine) 20 mg one @  bedtime until cough is completely gone for at least a week without the need for cough suppression    If not happy for any reason you need to return here with the insurance formulary list of alternatives for respiratory      If you are satisfied with  your treatment plan,  let your doctor know and he/she can either refill your medications or you can return here when your prescription runs out.     If in any way you are not 100% satisfied,  please tell us.  If 100% better, tell your friends!  Pulmonary follow up is as needed

## 2017-01-31 DIAGNOSIS — R0602 Shortness of breath: Secondary | ICD-10-CM | POA: Diagnosis not present

## 2017-02-05 ENCOUNTER — Telehealth: Payer: Self-pay | Admitting: Internal Medicine

## 2017-02-05 MED ORDER — BUDESONIDE-FORMOTEROL FUMARATE 160-4.5 MCG/ACT IN AERO
2.0000 | INHALATION_SPRAY | Freq: Two times a day (BID) | RESPIRATORY_TRACT | 3 refills | Status: DC
Start: 2017-02-05 — End: 2017-08-21

## 2017-02-05 NOTE — Telephone Encounter (Signed)
Spoke with pt, who states bevespi is not a covered medication. Covered alternatives are symbicort and advair.   MW please advise. Thanks.   12/29/16  Plan A = Automatic = Bevespi Take 2 puffs first thing in am and then another 2 puffs about 12 hours later.  Also start taking  prilosec otc 20mg   Take 30-60 min before first meal of the day and Pepcid ac (famotidine) 20 mg one @  bedtime until  Return        Work on inhaler technique:  relax and gently blow all the way out then take a nice smooth deep breath back in, triggering the inhaler at same time you start breathing in.  Hold for up to 5 seconds if you can. Blow out thru nose. Rinse and gargle with water when done      Plan B = Backup Only use your albuterol as a rescue medication to be used if you can't catch your breath by resting or doing a relaxed purse lip breathing pattern.  - The less you use it, the better it will work when you need it. - Ok to use the inhaler up to 2 puffs  every 4 hours if you must but call for appointment if use goes up over your usual need - Don't leave home without it !!  (think of it like the spare tire for your car)    Augmentin 875 mg take one pill twice daily  X 10 days - take at breakfast and supper with large glass of water.  It would help reduce the usual side effects (diarrhea and yeast infections) if you ate cultured yogurt at lunch.   Please remember to go to the lab  department downstairs in the basement  for your tests - we will call you with the results when they are available.    Please schedule a follow up office visit in 4 weeks, sooner if needed  with all medications /inhalers/ solutions in hand so we can verify exactly what you are taking. This includes all medications from all doctors and over the counters

## 2017-02-05 NOTE — Telephone Encounter (Signed)
Rx has been sent to preferred pharmacy. Pt is aware and voiced his understanding. Nothing further needed.  

## 2017-02-05 NOTE — Telephone Encounter (Signed)
symbicort 160 2bid but when returns needs to bring his forumulary if not entirely  happy with symbicort

## 2017-02-06 DIAGNOSIS — Z86010 Personal history of colonic polyps: Secondary | ICD-10-CM | POA: Diagnosis not present

## 2017-02-06 DIAGNOSIS — D123 Benign neoplasm of transverse colon: Secondary | ICD-10-CM | POA: Diagnosis not present

## 2017-02-06 DIAGNOSIS — D128 Benign neoplasm of rectum: Secondary | ICD-10-CM | POA: Diagnosis not present

## 2017-02-06 DIAGNOSIS — K5900 Constipation, unspecified: Secondary | ICD-10-CM | POA: Diagnosis not present

## 2017-02-06 DIAGNOSIS — K635 Polyp of colon: Secondary | ICD-10-CM | POA: Diagnosis not present

## 2017-02-06 DIAGNOSIS — D122 Benign neoplasm of ascending colon: Secondary | ICD-10-CM | POA: Diagnosis not present

## 2017-02-06 DIAGNOSIS — D124 Benign neoplasm of descending colon: Secondary | ICD-10-CM | POA: Diagnosis not present

## 2017-08-21 ENCOUNTER — Other Ambulatory Visit: Payer: Self-pay | Admitting: Internal Medicine

## 2017-11-05 ENCOUNTER — Other Ambulatory Visit: Payer: Self-pay | Admitting: Internal Medicine

## 2017-11-09 ENCOUNTER — Telehealth: Payer: Self-pay | Admitting: Internal Medicine

## 2017-11-09 MED ORDER — BUDESONIDE-FORMOTEROL FUMARATE 160-4.5 MCG/ACT IN AERO: 2.0000 | INHALATION_SPRAY | Freq: Two times a day (BID) | RESPIRATORY_TRACT | 1 refills | Status: DC

## 2017-11-09 NOTE — Telephone Encounter (Signed)
Pt requesting Symbicort RX.  This has been sent to preferred pharmacy.  Nothing further needed.

## 2017-11-12 ENCOUNTER — Encounter: Payer: Self-pay | Admitting: Internal Medicine

## 2017-11-12 ENCOUNTER — Ambulatory Visit: Payer: PPO | Admitting: Internal Medicine

## 2017-11-12 VITALS — BP 122/76 | HR 60 | Ht 66.0 in | Wt 178.6 lb

## 2017-11-12 DIAGNOSIS — J479 Bronchiectasis, uncomplicated: Secondary | ICD-10-CM

## 2017-11-12 DIAGNOSIS — J449 Chronic obstructive pulmonary disease, unspecified: Secondary | ICD-10-CM

## 2017-11-12 MED ORDER — FLUTICASONE-UMECLIDIN-VILANT 100-62.5-25 MCG/INH IN AEPB: 1.0000 | INHALATION_SPRAY | Freq: Every day | RESPIRATORY_TRACT | 11 refills | Status: DC

## 2017-11-12 MED ORDER — FLUTICASONE-UMECLIDIN-VILANT 100-62.5-25 MCG/INH IN AEPB: 1.0000 | INHALATION_SPRAY | Freq: Every day | RESPIRATORY_TRACT | 0 refills | Status: DC

## 2017-11-12 NOTE — Patient Instructions (Signed)
Change symbicort on a trial basis to trelegy one click each am     Please schedule a follow up visit in 12 months but call sooner if needed  -  If you don't like the trelegy, resume the symbicort

## 2017-11-12 NOTE — Progress Notes (Signed)
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 rec If not revealing will consider going ahead with a cpst - we will call to schedule Try prilosec 20mg   Take 30-60 min before first meal of the day and Pepcid 20 mg one bedtime until  We complete your work up GERD diet     12/29/2016  f/u ov/Witt Plitt re:  Girtha Rm II copd/ worse sob with new dx of bronchiectasis  Chief Complaint  Patient presents with  . Pulmonary Consult    Referred by Dr. Johnsie Cancel. Pt c/o DOE with exertion such as walking up hills and carrying out the garbage. He states he has had SOB for years, but this has been worse for 6 months. He also c/o prod cough with light green sputum.    all better / able to get up driveway fine p last pulmonary eval and eventually stopped the gerd rx got worse under [facility] f/u > pulmonary eval WS dx COPD rec inhalers > better transiently but worse x 6 m to point where can't get up same driveway without stopping half way - saba still helps some Mucus slt green x 6 m x 10 min each am ssoc with some pnds  Watery nasal discharge x years        01/26/2017  f/u ov/Soul Deveney re:  GOLD II copd/ bronchiectasis on bevespi 2bid/ rare saba  On otc gerd rx  Chief Complaint  Patient presents with  . Follow-up    Breathing has improved some but not back to  baseline. His cough had resolved and then returned after finished abx- prod with beige sputum.   mailbox to house s stopping now due to sob  No am exacs and sleeps fine rec Bronchiectasis =   you have scarring of your bronchial tubes which means that they don't function perfectly normally and mucus tends to pool in certain areas of your lung which can cause pneumonia and further scarring of your lung and bronchial tubes Whenever you develop cough congestion take mucinex or mucinex dm  Up to 1200 mg every 12 hours as needed > these will help keep the mucus loose and flowing but if your condition worsens you need to seek help immediately preferably here or somewhere inside the [facility] system to compare xrays ( worse = darker or bloody mucus or pain on breathing in)   Plan A = Automatic =  Bevespi Take 2 puffs first thing in am and then another 2 puffs about 12 hours later.  Work on inhaler technique:   Plan B = Backup Only use your albuterol as a rescue medication       11/12/2017  f/u ov/Evona Westra re: copd gold II/ bronchiectasis back on  symbicort due to formulary restrictions  Chief Complaint  Patient presents with  . Follow-up    Breathing is unchanged. He is using his proair 2 x per wk on average.   Dyspnea:  House to mb a struggle x 100 ft steep incline  Cough: no problem Sleep: flat ok  SABA use:  Rare   No obvious day to day or daytime variability or assoc excess/ purulent sputum or mucus plugs or hemoptysis or cp or chest tightness, subjective wheeze or overt sinus or hb symptoms. No unusual exposure hx or h/o childhood pna/ asthma or knowledge of premature birth.  Sleeping ok flat without nocturnal  or early am exacerbation  of respiratory  c/o's or need for noct saba. Also denies any obvious fluctuation of symptoms with weather or environmental changes or other aggravating or alleviating factors except as outlined above   Current Allergies, Complete Past Medical History, Past Surgical  History, Family History, and Social History were reviewed in [employer] record.  ROS  The following are not active complaints unless bolded Hoarseness, sore throat, dysphagia, dental problems, itching, sneezing,  nasal congestion or discharge of excess mucus or purulent secretions, ear ache,   fever, chills, sweats, unintended wt loss or wt gain, classically pleuritic or exertional cp,  orthopnea pnd or leg swelling, presyncope, palpitations, abdominal pain, anorexia, nausea, vomiting, diarrhea  or change in bowel habits or change in bladder habits, change in stools or change in urine, dysuria, hematuria,  rash, arthralgias, visual complaints, headache, numbness, weakness or ataxia or problems with walking or coordination,  change in mood/affect or memory.        Current Meds  Medication Sig  . atorvastatin (LIPITOR) 10 MG tablet Take 10 mg by mouth daily.  . colchicine 0.6 MG tablet Take 1 tablet by mouth daily as needed.  Marland Kitchen PROAIR HFA 108 (90 Base) MCG/ACT inhaler Inhale 2 puffs into the lungs as directed.  .   budesonide-formoterol (SYMBICORT) 160-4.5 MCG/ACT inhaler Inhale 2 puffs into the lungs 2 (two) times daily.       .            Objective:   Physical Exam    .11/12/2017       178   01/26/2017       176  12/29/16 177 lb (80.3 kg)  12/20/16 176 lb 6.4 oz (80 kg)  12/08/11 179 lb (81.2 kg)   Wt 179 12/08/2011  Amb pleasant wm nad    Vital signs reviewed - Note on arrival 02 sats  96 % on RA       HEENT: nl dentition, turbinates bilaterally, and oropharynx. Nl external ear canals without cough reflex   NECK :  without JVD/Nodes/TM/ nl carotid upstrokes bilaterally   LUNGS: no acc muscle use,  Nl contour chest with distant bs bilaterally without cough on insp or exp maneuvers   CV:  RRR  no s3 or murmur or increase in P2, and no edema   ABD:  soft and nontender with Pos Hoover's mid insp  in the supine position. No bruits or organomegaly  appreciated, bowel sounds nl  MS:  Nl gait/ ext warm without deformities, calf tenderness, cyanosis or clubbing No obvious joint restrictions   SKIN: warm and dry without lesions    NEURO:  alert, approp, nl sensorium with  no motor or cerebellar deficits apparent.        I personally reviewed images and agree with radiology impression as follows:  CT chest 12/28/16 1. No evidence of interstitial lung disease. 2. Diffuse bronchial wall thickening with moderate centrilobular and mild paraseptal emphysema; imaging findings compatible with the reported clinical history of COPD. 3. 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            Assessment & Plan:

## 2017-11-13 ENCOUNTER — Telehealth: Payer: Self-pay | Admitting: Internal Medicine

## 2017-11-13 NOTE — Telephone Encounter (Signed)
Form has been signed by MW and faxed to [facility]. Will await their decision.

## 2017-11-13 NOTE — Telephone Encounter (Signed)
Received form. Will go ahead and fill out form and fax back to [facility].

## 2017-11-14 ENCOUNTER — Encounter: Payer: Self-pay | Admitting: Internal Medicine

## 2017-11-14 NOTE — Assessment & Plan Note (Signed)
CT chest 12/28/16 1. No evidence of interstitial lung disease. 2. Diffuse bronchial wall thickening with moderate centrilobular and mild paraseptal emphysema; imaging findings compatible with the reported clinical history of COPD. 3. 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  Adequate control on present rx, no recent flares >> reviewed in detail with pt > no change in rx needed

## 2017-11-14 NOTE — Telephone Encounter (Signed)
Received letter of approval from 11/13/17- 09/03/18  Faxed to pharm [facility]) to make them aware

## 2017-11-14 NOTE — Assessment & Plan Note (Signed)
-   Spirometry 06/18/11 FEV1 1.27 (52%) ratio 40  - Spirometry 12/29/2016  FEV1 0.78 (34%)  Ratio 37  No saba first  - 12/29/2016  After extensive coaching HFA effectiveness =    50% from baseline of 50% rec trial of bevespi 2bid  - alpha one AT screen 12/29/16 >  MM - Allergy profile  12/29/16  >    IgE  16  RAST neg  - 01/26/2017  After extensive coaching HFA effectiveness =  50% from a baseline 25%     - 11/12/2017  After extensive coaching inhaler device  effectiveness =    90% with dpi > try trelegy if insurance covers    Group D in terms of symptom/risk and laba/lama/ICS  therefore appropriate rx at this point > try trelegy  Discussed with pt Formulary restrictions will be an ongoing challenge for the forseable future and I would be happy to pick an alternative if the pt will first  provide me a list of them but pt  will need to return here for training for any new device that is required eg dpi vs hfa vs respimat.    In meantime we can always provide samples so the patient never runs out of any needed respiratory medications.   Each maintenance medication was reviewed in detail including most importantly the difference between maintenance and as needed and under what circumstances the prns are to be used.  Please see AVS for specific  Instructions which are unique to this visit and I personally typed out  which were reviewed in detail in writing with the patient and a copy provided.    > 50% of ov spent in counseling

## 2017-12-27 DIAGNOSIS — N401 Enlarged prostate with lower urinary tract symptoms: Secondary | ICD-10-CM | POA: Diagnosis not present

## 2017-12-27 DIAGNOSIS — R102 Pelvic and perineal pain: Secondary | ICD-10-CM | POA: Diagnosis not present

## 2017-12-27 DIAGNOSIS — R3912 Poor urinary stream: Secondary | ICD-10-CM | POA: Diagnosis not present

## 2017-12-27 DIAGNOSIS — R3121 Asymptomatic microscopic hematuria: Secondary | ICD-10-CM | POA: Diagnosis not present

## 2018-01-07 DIAGNOSIS — E785 Hyperlipidemia, unspecified: Secondary | ICD-10-CM | POA: Diagnosis not present

## 2018-01-07 DIAGNOSIS — Z0000 Encounter for general adult medical examination without abnormal findings: Secondary | ICD-10-CM | POA: Diagnosis not present

## 2018-01-07 DIAGNOSIS — Z125 Encounter for screening for malignant neoplasm of prostate: Secondary | ICD-10-CM | POA: Diagnosis not present

## 2018-01-07 DIAGNOSIS — J449 Chronic obstructive pulmonary disease, unspecified: Secondary | ICD-10-CM | POA: Diagnosis not present

## 2018-01-07 DIAGNOSIS — Z1211 Encounter for screening for malignant neoplasm of colon: Secondary | ICD-10-CM | POA: Diagnosis not present

## 2018-01-17 DIAGNOSIS — R3121 Asymptomatic microscopic hematuria: Secondary | ICD-10-CM | POA: Diagnosis not present

## 2018-01-17 DIAGNOSIS — N410 Acute prostatitis: Secondary | ICD-10-CM | POA: Diagnosis not present

## 2018-01-17 DIAGNOSIS — R102 Pelvic and perineal pain: Secondary | ICD-10-CM | POA: Diagnosis not present

## 2018-02-24 DIAGNOSIS — S90862A Insect bite (nonvenomous), left foot, initial encounter: Secondary | ICD-10-CM | POA: Diagnosis not present

## 2018-02-25 DIAGNOSIS — R102 Pelvic and perineal pain: Secondary | ICD-10-CM | POA: Diagnosis not present

## 2018-04-19 DIAGNOSIS — R3121 Asymptomatic microscopic hematuria: Secondary | ICD-10-CM | POA: Diagnosis not present

## 2018-04-25 DIAGNOSIS — R3121 Asymptomatic microscopic hematuria: Secondary | ICD-10-CM | POA: Diagnosis not present

## 2018-04-25 DIAGNOSIS — N200 Calculus of kidney: Secondary | ICD-10-CM | POA: Diagnosis not present

## 2018-07-02 DIAGNOSIS — H2513 Age-related nuclear cataract, bilateral: Secondary | ICD-10-CM | POA: Diagnosis not present

## 2018-07-10 DIAGNOSIS — H2512 Age-related nuclear cataract, left eye: Secondary | ICD-10-CM | POA: Diagnosis not present

## 2018-07-10 DIAGNOSIS — H25812 Combined forms of age-related cataract, left eye: Secondary | ICD-10-CM | POA: Diagnosis not present

## 2018-08-19 DIAGNOSIS — J449 Chronic obstructive pulmonary disease, unspecified: Secondary | ICD-10-CM | POA: Diagnosis not present

## 2018-08-19 DIAGNOSIS — R05 Cough: Secondary | ICD-10-CM | POA: Diagnosis not present

## 2018-08-19 DIAGNOSIS — J988 Other specified respiratory disorders: Secondary | ICD-10-CM | POA: Diagnosis not present

## 2018-10-09 DIAGNOSIS — H2511 Age-related nuclear cataract, right eye: Secondary | ICD-10-CM | POA: Diagnosis not present

## 2018-10-09 DIAGNOSIS — H25811 Combined forms of age-related cataract, right eye: Secondary | ICD-10-CM | POA: Diagnosis not present

## 2018-10-09 DIAGNOSIS — H25041 Posterior subcapsular polar age-related cataract, right eye: Secondary | ICD-10-CM | POA: Diagnosis not present

## 2018-11-13 ENCOUNTER — Encounter: Payer: Self-pay | Admitting: Internal Medicine

## 2018-11-13 ENCOUNTER — Other Ambulatory Visit: Payer: Self-pay

## 2018-11-13 ENCOUNTER — Ambulatory Visit: Payer: PPO | Admitting: Internal Medicine

## 2018-11-13 VITALS — BP 140/70 | HR 63 | Ht 66.0 in | Wt 186.0 lb

## 2018-11-13 DIAGNOSIS — J449 Chronic obstructive pulmonary disease, unspecified: Secondary | ICD-10-CM

## 2018-11-13 NOTE — Patient Instructions (Signed)
Stop trelegy about a week before it runs out  Only use your albuterol as a rescue medication to be used if you can't catch your breath by resting or doing a relaxed purse lip breathing pattern.  - The less you use it, the better it will work when you need it. - Ok to use up to 2 puffs  every 4 hours if you must but call for immediate appointment if use goes up over your usual need - Don't leave home without it !!  (think of it like the spare tire for your car)    Please schedule a follow up visit in 12  months but call sooner if needed

## 2018-11-13 NOTE — Progress Notes (Signed)
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 rec If not revealing will consider going ahead with a cpst - we will call to schedule Try prilosec 20mg   Take 30-60 min before first meal of the day and Pepcid 20 mg one bedtime until  We complete your work up GERD diet     12/29/2016  f/u ov/Aesha Agrawal re:  Girtha Rm II copd/ worse sob with new dx of bronchiectasis  Chief Complaint  Patient presents with  . Pulmonary Consult    Referred by Dr. Johnsie Cancel. Pt c/o DOE with exertion such as walking up hills and carrying out the garbage. He states he has had SOB for years, but this has been worse for 6 months. He also c/o prod cough with light green sputum.    all better / able to get up driveway fine p last pulmonary eval and eventually stopped the gerd rx got worse under [facility] f/u > pulmonary eval [facility] dx COPD rec inhalers > better transiently but worse x 6 m to point where can't get up same driveway without stopping half way - saba still helps some Mucus slt green x 6 m x 10 min each am ssoc with some pnds  Watery nasal discharge x years        01/26/2017  f/u ov/Zaryia Markel re:  GOLD II copd/ bronchiectasis on bevespi 2bid/ rare saba  On otc gerd rx  Chief Complaint  Patient presents with  . Follow-up    Breathing has improved some but not back to  baseline. His cough had resolved and then returned after finished abx- prod with beige sputum.   mailbox to house s stopping now due to sob  No am exacs and sleeps fine rec Bronchiectasis =   you have scarring of your bronchial tubes which means that they don't function perfectly normally and mucus tends to pool in certain areas of your lung which can cause pneumonia and further scarring of your lung and bronchial tubes Whenever you develop cough congestion take mucinex or mucinex dm  Up to 1200 mg every 12 hours as needed > these will help keep the mucus loose and flowing but if your condition worsens you need to seek help immediately preferably here or somewhere inside the [facility] system to compare xrays ( worse = darker or bloody mucus or pain on breathing in)   Plan A = Automatic =  Bevespi Take 2 puffs first thing in am and then another 2 puffs about 12 hours later.  Work on inhaler technique:   Plan B = Backup Only use your albuterol as a rescue medication       11/12/2017  f/u ov/Kendalynn Wideman re: copd gold II/ bronchiectasis back on  symbicort due to formulary restrictions  Chief Complaint  Patient presents with  . Follow-up    Breathing is unchanged. He is using his proair 2 x per wk on average.   Dyspnea:  House to mb a struggle x 100 ft steep incline  Cough: no problem Sleep: flat ok  SABA use:  Rare rec Change symbicort on a trial basis to trelegy one click each am       03/29/3663  f/u ov/Valta Dillon re:  GOLD III / bronchiectasis maint rx = trelegy  Chief Complaint  Patient presents with  . Follow-up    Breathing is unchanged. He is using his albuterol inhaler 3 x per wk on average.   Dyspnea:  MMRC1 = can walk nl pace, flat grade, can't hurry or go uphills or steps s sob  / carry drill box or chain saw now / not convinced meds are helping vs baseline  Cough: none Sleeping: flat / 2 pillows  SABA use: proair 2-3  02: no    No obvious day to day or daytime variability or assoc excess/  purulent sputum or mucus plugs or hemoptysis or cp or chest tightness, subjective wheeze or overt sinus or hb symptoms.   Sleeping  without nocturnal  or early am exacerbation  of respiratory  c/o's or need for noct saba. Also denies any obvious fluctuation of symptoms with weather or environmental changes or other aggravating or alleviating factors except as outlined above   No unusual exposure hx or h/o childhood pna/ asthma or knowledge of premature birth.  Current Allergies, Complete Past Medical History, Past Surgical History, Family History, and Social History were reviewed in [employer] record.  ROS  The following are not active complaints unless bolded Hoarseness, sore throat, dysphagia, dental problems, itching, sneezing,  nasal congestion or discharge of excess mucus or purulent secretions, ear ache,   fever, chills, sweats, unintended wt loss or wt gain, classically pleuritic or exertional cp,  orthopnea pnd or arm/hand swelling  or leg swelling, presyncope, palpitations, abdominal pain, anorexia, nausea, vomiting, diarrhea  or change in bowel habits or change in bladder habits, change in stools or change in urine, dysuria, hematuria,  rash, arthralgias, visual complaints, headache, numbness, weakness or ataxia or problems with walking or coordination,  change in mood or  memory.        Current Meds  Medication Sig  . atorvastatin (LIPITOR) 10 MG tablet Take 10 mg by mouth daily.  . colchicine 0.6 MG tablet Take 1 tablet by mouth daily as needed.  . Fluticasone-Umeclidin-Vilant (TRELEGY ELLIPTA) 100-62.5-25 MCG/INH AEPB Inhale 1 puff into the lungs daily.  Marland Kitchen PROAIR HFA 108 (90 Base) MCG/ACT inhaler Inhale 2 puffs into the lungs as directed.  . tamsulosin (FLOMAX) 0.4 MG CAPS capsule Take 1 capsule by mouth daily.                              Objective:   Physical Exam   11/13/2018        186  .11/12/2017       178   01/26/2017       176   12/29/16 177 lb (80.3 kg)  12/20/16 176 lb 6.4 oz (80 kg)  12/08/11 179 lb (81.2 kg)   Wt 179 12/08/2011  amb mod obese wm nad     Vital signs reviewed - Note on arrival 02 sats  94% on RA  HEENT: nl dentition / oropharynx. Nl external ear canals without cough reflex -  Mild bilateral non-specific turbinate edema     NECK :  without JVD/Nodes/TM/ nl carotid upstrokes bilaterally   LUNGS: no acc muscle use,  Mild barrel  contour chest wall with bilateral  Distant bs s audible wheeze and  without cough on insp or exp maneuver and mild  Hyperresonant  to  percussion bilaterally     CV:  RRR  no s3 or murmur or increase in P2, and no edema   ABD:  soft and nontender with pos  late  insp Hoover's  in the supine position. No bruits or organomegaly appreciated, bowel sounds nl  MS:   Nl gait/  ext warm without deformities, calf tenderness, cyanosis or clubbing No obvious joint restrictions   SKIN: warm and dry without lesions    NEURO:  alert, approp, nl sensorium with  no motor or cerebellar deficits apparent.           Assessment & Plan:

## 2018-11-15 ENCOUNTER — Encounter: Payer: Self-pay | Admitting: Internal Medicine

## 2018-11-15 DIAGNOSIS — H59031 Cystoid macular edema following cataract surgery, right eye: Secondary | ICD-10-CM | POA: Diagnosis not present

## 2018-11-15 NOTE — Assessment & Plan Note (Addendum)
Quit smoking  - Spirometry 06/18/11 FEV1 1.27 (52%) ratio 40  - Spirometry 12/29/2016  FEV1 0.78 (34%)  Ratio 37  No saba first  - 12/29/2016  After extensive coaching HFA effectiveness =    50% from baseline of 50% rec trial of bevespi 2bid  - alpha one AT screen 12/29/16 >  MM - Allergy profile  12/29/16  >    IgE  16  RAST neg  - 01/26/2017  After extensive coaching HFA effectiveness =  50% from a baseline 25%    - 11/12/2017  After extensive coaching inhaler device  effectiveness =    90% with dpi > try trelegy if insurance covers - 11/13/2018 d/c trelegy trial basis as not sure it's helping    I think the trelegy is helping more than he does but trouble affording and fine to try off for a week as long as has immediate access to restart using  A reverse therapeutic trial concept/ using saba prn to treat acute symptoms and not how much more often he needs saba p stops maint rx as a guide to restart (no guideline for this like there is for asthma so it's a judgement call)     I had an extended discussion with the patient reviewing all relevant studies completed to date and  lasting 10 minutes of a 15 minute visit    Each maintenance medication was reviewed in detail including most importantly the difference between maintenance and prns and under what circumstances the prns are to be triggered using an action plan format that is not reflected in the computer generated alphabetically organized AVS.     Please see AVS for specific instructions unique to this visit that I personally wrote and verbalized to the the pt in detail and then reviewed with pt  by my nurse highlighting any  changes in therapy recommended at today's visit to their plan of care.

## 2018-11-24 ENCOUNTER — Other Ambulatory Visit: Payer: Self-pay | Admitting: Internal Medicine

## 2018-11-27 ENCOUNTER — Telehealth: Payer: Self-pay | Admitting: Internal Medicine

## 2018-11-27 MED ORDER — FLUTICASONE-UMECLIDIN-VILANT 100-62.5-25 MCG/INH IN AEPB: 1.0000 | INHALATION_SPRAY | Freq: Every day | RESPIRATORY_TRACT | 11 refills | Status: DC

## 2018-11-27 NOTE — Telephone Encounter (Signed)
Spoke with pt. He is needing a refill on Trelegy. Rx has been sent in. Nothing further was needed.

## 2019-01-24 DIAGNOSIS — E785 Hyperlipidemia, unspecified: Secondary | ICD-10-CM | POA: Diagnosis not present

## 2019-01-24 DIAGNOSIS — Z0000 Encounter for general adult medical examination without abnormal findings: Secondary | ICD-10-CM | POA: Diagnosis not present

## 2019-01-24 DIAGNOSIS — Z125 Encounter for screening for malignant neoplasm of prostate: Secondary | ICD-10-CM | POA: Diagnosis not present

## 2019-01-28 DIAGNOSIS — E785 Hyperlipidemia, unspecified: Secondary | ICD-10-CM | POA: Diagnosis not present

## 2019-01-28 DIAGNOSIS — J449 Chronic obstructive pulmonary disease, unspecified: Secondary | ICD-10-CM | POA: Diagnosis not present

## 2019-01-28 DIAGNOSIS — M109 Gout, unspecified: Secondary | ICD-10-CM | POA: Diagnosis not present

## 2019-01-28 DIAGNOSIS — Z0000 Encounter for general adult medical examination without abnormal findings: Secondary | ICD-10-CM | POA: Diagnosis not present

## 2019-04-23 DIAGNOSIS — R102 Pelvic and perineal pain: Secondary | ICD-10-CM | POA: Diagnosis not present

## 2019-06-17 DIAGNOSIS — H04123 Dry eye syndrome of bilateral lacrimal glands: Secondary | ICD-10-CM | POA: Diagnosis not present

## 2019-06-17 DIAGNOSIS — Z961 Presence of intraocular lens: Secondary | ICD-10-CM | POA: Diagnosis not present

## 2019-11-17 ENCOUNTER — Ambulatory Visit: Payer: PPO | Admitting: Internal Medicine

## 2019-11-17 ENCOUNTER — Encounter: Payer: Self-pay | Admitting: Internal Medicine

## 2019-11-17 ENCOUNTER — Other Ambulatory Visit: Payer: Self-pay

## 2019-11-17 DIAGNOSIS — J449 Chronic obstructive pulmonary disease, unspecified: Secondary | ICD-10-CM | POA: Diagnosis not present

## 2019-11-17 MED ORDER — PROAIR HFA 108 (90 BASE) MCG/ACT IN AERS: 2.0000 | INHALATION_SPRAY | RESPIRATORY_TRACT | 1 refills | Status: DC

## 2019-11-17 MED ORDER — BREZTRI AEROSPHERE 160-9-4.8 MCG/ACT IN AERO: 2.0000 | INHALATION_SPRAY | Freq: Two times a day (BID) | RESPIRATORY_TRACT | 0 refills | Status: DC

## 2019-11-17 MED ORDER — BREZTRI AEROSPHERE 160-9-4.8 MCG/ACT IN AERO: 2.0000 | INHALATION_SPRAY | Freq: Two times a day (BID) | RESPIRATORY_TRACT | 11 refills | Status: DC

## 2019-11-17 NOTE — Assessment & Plan Note (Signed)
Quit smoking  - Spirometry 06/18/11 FEV1 1.27 (52%) ratio 40  - Spirometry 12/29/2016  FEV1 0.78 (34%)  Ratio 37  No saba first  - 12/29/2016  After extensive coaching HFA effectiveness =    50% from baseline of 50% rec trial of bevespi 2bid  - alpha one AT screen 12/29/16 >  MM - Allergy profile  12/29/16  >    IgE  16  RAST neg  - 01/26/2017  After extensive coaching HFA effectiveness =  50% from a baseline 25%    - 11/12/2017  After extensive coaching inhaler device  effectiveness =    90% with dpi > try trelegy if insurance covers - 11/13/2018 d/c trelegy trial basis as not sure it's helping > worse sob so restarted - 11/17/2019  After extensive coaching inhaler device,  effectiveness =    75% try breztri 2bid    Group D in terms of symptom/risk and laba/lama/ICS  therefore appropriate rx at this point >>>  Overusing hfa saba on dpi trelegy so reasonable to try hfa breztri   Advised:  I spent extra time with pt today reviewing appropriate use of albuterol for prn use on exertion with the following points: 1) saba is for relief of sob that does not improve by walking a slower pace or resting but rather if the pt does not improve after trying this first. 2) If the pt is convinced, as many are, that saba helps recover from activity faster then it's easy to tell if this is the case by re-challenging : ie stop, take the inhaler, then p 5 minutes try the exact same activity (intensity of workload) that just caused the symptoms and see if they are substantially diminished or not after saba 3) if there is an activity that reproducibly causes the symptoms, try the saba 15 min before the activity on alternate days   If in fact the saba really does help, then fine to continue to use it prn but advised may need to look closer at the maintenance regimen being used to achieve better control of airways disease with exertion.    Also: Advised:  formulary restrictions will be an ongoing challenge for the  forseable future and I would be happy to pick an alternative if the pt will first  provide me a list of them -  pt  will need to return here for training for any new device that is required eg dpi vs hfa vs respimat.    In the meantime we can always provide samples so that the patient never runs out of any needed respiratory medications.     F/u q 12 m if doing well, o/w return to regroup          Each maintenance medication was reviewed in detail including emphasizing most importantly the difference between maintenance and prns and under what circumstances the prns are to be triggered using an action plan format where appropriate.  Total time for H and P, chart review, counseling, teaching device and generating customized AVS unique to this office visit / charting = 30 min

## 2019-11-17 NOTE — Patient Instructions (Addendum)
Plan A = Automatic = Always=   Stop Trelegy and start Breztri  Take 2 puffs first thing in am and then another 2 puffs about 12 hours later.   Work on inhaler technique:  relax and gently blow all the way out then take a nice smooth deep breath back in, triggering the inhaler at same time you start breathing in.  Hold for up to 5 seconds if you can. Blow out thru nose. Rinse and gargle with water when done      Plan B = Backup (to supplement plan A, not to replace it) Only use your albuterol (Proair)  inhaler as a rescue medication to be used if you can't catch your breath by resting or doing a relaxed purse lip breathing pattern.  - The less you use it, the better it will work when you need it. - Ok to use the inhaler up to 2 puffs  every 4 hours if you must but call for appointment if use goes up over your usual need - Don't leave home without it !!  (think of it like the spare tire for your car)  Please schedule a follow up visit in 12 months but call sooner if needed

## 2019-11-17 NOTE — Progress Notes (Signed)
Subjective:    Patient ID: Marc Flores, male    DOB: 11-Jun-1941  MRN: [id number]    Brief patient profile:  31  yowm quit smoking 1992 on arrival to [place] at wt 150-160  from [place] with no trouble at all with respiratory problems and new onset sob 2006 referred by Dr Melford Aase for eval of sob 12/08/2011 to pulmonary clinic with confirmed GOLD Flores copd by spirometry on 06/2011 pre rx by  [facility]     History of Present Illness  12/08/2011 1st pulmonary ov gradual worsening doe indolent onset progressively worsex 6 years to point where may be a couple hundred feet or up the driveway to the house and has to stop at top to rest,  and no better with dulera and spiriva or proare but assoc with audible wheezing, no overt hb. rec If not revealing will consider going ahead with a cpst - we will call to schedule Try prilosec 20mg   Take 30-60 min before first meal of the day and Pepcid 20 mg one bedtime until  We complete your work up GERD diet     12/29/2016  f/u ov/Marc Flores re:  Marc Flores copd/ worse sob with new dx of bronchiectasis  Chief Complaint  Patient presents with  . Pulmonary Consult    Referred by Dr. Johnsie Cancel. Pt c/o DOE with exertion such as walking up hills and carrying out the garbage. He states he has had SOB for years, but this has been worse for 6 months. He also c/o prod cough with light green sputum.    all better / able to get up driveway fine p last pulmonary eval and eventually stopped the gerd rx got worse under [facility] f/u > pulmonary eval [facility] dx COPD rec inhalers > better transiently but worse x 6 m to point where can't get up same driveway without stopping half way - saba still helps some Mucus slt green x 6 m x 10 min each am ssoc with some pnds  Watery nasal discharge x years        01/26/2017  f/u ov/Marc Flores re:  GOLD Flores copd/ bronchiectasis on bevespi 2bid/ rare saba  On otc gerd rx  Chief Complaint  Patient presents with  . Follow-up    Breathing has improved some but not back to  baseline. His cough had resolved and then returned after finished abx- prod with beige sputum.   mailbox to house s stopping now due to sob  No am exacs and sleeps fine rec Bronchiectasis =   you have scarring of your bronchial tubes which means that they don't function perfectly normally and mucus tends to pool in certain areas of your lung which can cause pneumonia and further scarring of your lung and bronchial tubes Whenever you develop cough congestion take mucinex or mucinex dm  Up to 1200 mg every 12 hours as needed > these will help keep the mucus loose and flowing but if your condition worsens you need to seek help immediately preferably here or somewhere inside the [facility] system to compare xrays ( worse = darker or bloody mucus or pain on breathing in)   Plan A = Automatic =  Bevespi Take 2 puffs first thing in am and then another 2 puffs about 12 hours later.  Work on inhaler technique:   Plan B = Backup Only use your albuterol as a rescue medication       11/12/2017  f/u ov/Marc Flores re: copd gold Flores/ bronchiectasis back on  symbicort due to formulary restrictions  Chief Complaint  Patient presents with  . Follow-up    Breathing is unchanged. He is using his proair 2 x per wk on average.   Dyspnea:  House to mb a struggle x 100 ft steep incline  Cough: no problem Sleep: flat ok  SABA use:  Rare rec Change symbicort on a trial basis to trelegy one click each am       [id number]  f/u ov/Marc Flores re:  GOLD III / bronchiectasis maint rx = trelegy  Chief Complaint  Patient presents with  . Follow-up    Breathing is unchanged. He is using his albuterol inhaler 3 x per wk on average.   Dyspnea:  MMRC1 = can walk nl pace, flat grade, can't hurry or go uphills or steps s sob  / carry drill box or chain saw now / not convinced meds are helping vs baseline  Cough: none Sleeping: flat / 2 pillows  SABA use: proair 2-3 x per week 02: no  rec Stop trelegy about a week before it runs out Only  use your albuterol as a rescue medication to be used if you can't catch your breath    11/17/2019  f/u ov/Marc Flores re:  GOLD III/ bronchiectasis  maint rx trelegy Chief Complaint  Patient presents with  . Follow-up    Breathing has been worse for the past year- relates to when he wears a face covering. He is using his proair 4 x daily on average.    Dyspnea:  Mailbox and back about the same,  Cough: none  Sleeping: flat/ L side down/ 2 pillows  SABA use: as above, mostly with exertion  And way too much   02: none    No obvious day to day or daytime variability or assoc excess/ purulent sputum or mucus plugs or hemoptysis or cp or chest tightness, subjective wheeze or overt sinus or hb symptoms.   Sleeping  without nocturnal  or early am exacerbation  of respiratory  c/o's or need for noct saba. Also denies any obvious fluctuation of symptoms with weather or environmental changes or other aggravating or alleviating factors except as outlined above   No unusual exposure hx or h/o childhood pna/ asthma or knowledge of premature birth.  Current Allergies, Complete Past Medical History, Past Surgical History, Family History, and Social History were reviewed in [employer] record.  ROS  The following are not active complaints unless bolded Hoarseness, sore throat, dysphagia, dental problems, itching, sneezing,  nasal congestion or discharge of excess mucus or purulent secretions, ear ache,   fever, chills, sweats, unintended wt loss or wt gain, classically pleuritic or exertional cp,  orthopnea pnd or arm/hand swelling  or leg swelling, presyncope, palpitations, abdominal pain, anorexia, nausea, vomiting, diarrhea  or change in bowel habits or change in bladder habits, change in stools or change in urine, dysuria, hematuria,  rash, arthralgias, visual complaints, headache, numbness, weakness or ataxia or problems with walking or coordination,  change in mood or  memory.         Current Meds  Medication Sig  . atorvastatin (LIPITOR) 10 MG tablet Take 10 mg by mouth daily.  . colchicine 0.6 MG tablet Take 1 tablet by mouth daily as needed.  . Fluticasone-Umeclidin-Vilant (TRELEGY ELLIPTA) 100-62.5-25 MCG/INH AEPB Inhale 1 puff into the lungs daily.  Marland Kitchen PROAIR HFA 108 (90 Base) MCG/ACT inhaler Inhale 2 puffs into the lungs as directed.  . tamsulosin (FLOMAX) 0.4 MG  CAPS capsule Take 1 capsule by mouth daily.                    Objective:   Physical Exam    11/17/2019        183 11/13/2018        186  .11/12/2017       178   01/26/2017       176  12/29/16 177 lb (80.3 kg)  12/20/16 176 lb 6.4 oz (80 kg)  12/08/11 179 lb (81.2 kg)    Vital signs reviewed  11/17/2019  - Note at rest 02 sats  95% on RA    HEENT : pt wearing mask not removed for exam due to covid -19 concerns.    NECK :  without JVD/Nodes/TM/ nl carotid upstrokes bilaterally   LUNGS: no acc muscle use,  Mod barrel  contour chest wall with bilateral  Distant bs s audible wheeze and  without cough on insp or exp maneuvers and mod  Hyperresonant  to  percussion bilaterally     CV:  RRR  no s3 or murmur or increase in P2, and no edema   ABD:  soft and nontender with pos mid insp Hoover's  in the supine position. No bruits or organomegaly appreciated, bowel sounds nl  MS:     ext warm without deformities, calf tenderness, cyanosis or clubbing No obvious joint restrictions   SKIN: warm and dry without lesions    NEURO:  alert, approp, nl sensorium with  no motor or cerebellar deficits apparent.                Assessment & Plan:

## 2019-11-18 ENCOUNTER — Telehealth: Payer: Self-pay | Admitting: Internal Medicine

## 2019-11-18 NOTE — Telephone Encounter (Signed)
Spoke with pt, states that Judithann Sauger is not covered by [employer].   Initiated PA via [email or web address] Key: [id number] PA has been sent to plan, and a determination is expected within 2 days.   Will hold in triage for follow-up.

## 2019-11-21 DIAGNOSIS — R3121 Asymptomatic microscopic hematuria: Secondary | ICD-10-CM | POA: Diagnosis not present

## 2019-11-21 DIAGNOSIS — R311 Benign essential microscopic hematuria: Secondary | ICD-10-CM | POA: Diagnosis not present

## 2019-11-21 DIAGNOSIS — R102 Pelvic and perineal pain: Secondary | ICD-10-CM | POA: Diagnosis not present

## 2019-11-25 NOTE — Telephone Encounter (Signed)
Checked CMM and Marc Flores has been approved from 11/18/19 - 09/03/20, PA#: [id number].   Called and spoke to pt. Pt states he has already checked with the pharmacy and it will be $90 per month and he is unable to afford this. Pt is requesting to stay on Trelegy costing him $45 per month. Pt states he called his insurance about other covered alternatives but would like to stay on a triple therapy inhaler.   Will forward to Dr. Melvyn Novas to advise if ok, or try something different.

## 2019-11-25 NOTE — Telephone Encounter (Signed)
Spoke with pt. He is aware of MW's response. Nothing further was needed. 

## 2019-11-25 NOTE — Telephone Encounter (Signed)
Fine to change to  trelegy since should be  familiar/ comfortable with the device

## 2019-11-26 DIAGNOSIS — R3121 Asymptomatic microscopic hematuria: Secondary | ICD-10-CM | POA: Diagnosis not present

## 2019-11-26 DIAGNOSIS — C670 Malignant neoplasm of trigone of bladder: Secondary | ICD-10-CM | POA: Diagnosis not present

## 2019-11-26 DIAGNOSIS — R102 Pelvic and perineal pain: Secondary | ICD-10-CM | POA: Diagnosis not present

## 2019-11-27 HISTORY — PX: CYSTOSCOPY: SUR368

## 2019-12-01 ENCOUNTER — Other Ambulatory Visit: Payer: Self-pay | Admitting: Urology

## 2019-12-02 MED ORDER — GEMCITABINE CHEMO FOR BLADDER INSTILLATION 2000 MG: 2000.0000 mg | Freq: Once | INTRAVENOUS | Status: AC

## 2019-12-04 ENCOUNTER — Other Ambulatory Visit: Payer: Self-pay | Admitting: Urology

## 2019-12-11 ENCOUNTER — Encounter (HOSPITAL_BASED_OUTPATIENT_CLINIC_OR_DEPARTMENT_OTHER): Payer: Self-pay | Admitting: Urology

## 2019-12-11 ENCOUNTER — Other Ambulatory Visit: Payer: Self-pay

## 2019-12-11 NOTE — Progress Notes (Addendum)
Spoke with Janett Billow zanetto pa ok to proceed  Spoke w/ via phone for pre-op interview---PATIENT Lab needs dos---- NONE              COVID test ------12-15-2019 1110 Arrive at -------1145 AM 12-18-2019  NO FOOD AFTER MIDNIGHT, CLEAR LIQUIDS UNTIL 745 AM THEN NPO Medications to take morning of surgery -----trelegy, proair inhaler prn and bring inhaler, atorvastatin Diabetic medication -----n/a Patient Special Instructions -----none Pre-Op special Istructions -----none Patient verbalized understanding of instructions that were given at this phone interview. Patient denies shortness of breath, chest pain, fever, cough a this phone interview.  Anesthesia : COPD GOLD III chart to jessica zanetto pa for review  PCP:dr aaron morrow [facility] Cardiologist :saw dr Johnsie Cancel 12-20-2016 for sob, referred back to dr wert 12-20-2016 epic Pulmonary: lov dr wert 11-17-2019 epic Chest x-ray 12-18-2016 epic : chest ct 12-28-2016 epic EKG :none Echo none: Cardiac Cath : none Sleep Study/ CPAP :n/a Fasting Blood Sugar :      / Checks Blood Sugar -- times a day:  n/a Blood Thinner/ Instructions /Last Dose:n/a ASA / Instructions/ Last Dose : n/a  Patient denies shortness of breath, chest pain, fever, and cough at this phone interview.

## 2019-12-15 ENCOUNTER — Other Ambulatory Visit (HOSPITAL_COMMUNITY)
Admission: RE | Admit: 2019-12-15 | Discharge: 2019-12-15 | Disposition: A | Discharge status: Home / Self Care | Payer: PPO | Source: Ambulatory Visit | Attending: Urology | Admitting: Urology

## 2019-12-15 DIAGNOSIS — Z20822 Contact with and (suspected) exposure to covid-19: Secondary | ICD-10-CM | POA: Diagnosis not present

## 2019-12-15 DIAGNOSIS — Z01812 Encounter for preprocedural laboratory examination: Secondary | ICD-10-CM | POA: Insufficient documentation

## 2019-12-15 LAB — SARS CORONAVIRUS 2 (TAT 6-24 HRS): SARS Coronavirus 2: NEGATIVE

## 2019-12-17 DIAGNOSIS — R102 Pelvic and perineal pain: Secondary | ICD-10-CM | POA: Diagnosis not present

## 2019-12-18 ENCOUNTER — Ambulatory Visit (HOSPITAL_BASED_OUTPATIENT_CLINIC_OR_DEPARTMENT_OTHER)
Admission: RE | Admit: 2019-12-18 | Discharge: 2019-12-18 | Disposition: A | Discharge status: Home / Self Care | Payer: PPO | Attending: Urology | Admitting: Urology

## 2019-12-18 ENCOUNTER — Ambulatory Visit (HOSPITAL_BASED_OUTPATIENT_CLINIC_OR_DEPARTMENT_OTHER): Payer: PPO | Admitting: Physician Assistant

## 2019-12-18 ENCOUNTER — Encounter (HOSPITAL_BASED_OUTPATIENT_CLINIC_OR_DEPARTMENT_OTHER)
Admission: RE | Disposition: A | Discharge status: Home / Self Care | Payer: Self-pay | Source: Home / Self Care | Attending: Urology

## 2019-12-18 ENCOUNTER — Encounter (HOSPITAL_BASED_OUTPATIENT_CLINIC_OR_DEPARTMENT_OTHER): Payer: Self-pay | Admitting: Urology

## 2019-12-18 DIAGNOSIS — D414 Neoplasm of uncertain behavior of bladder: Secondary | ICD-10-CM | POA: Diagnosis not present

## 2019-12-18 DIAGNOSIS — J449 Chronic obstructive pulmonary disease, unspecified: Secondary | ICD-10-CM | POA: Insufficient documentation

## 2019-12-18 DIAGNOSIS — N4889 Other specified disorders of penis: Secondary | ICD-10-CM | POA: Diagnosis not present

## 2019-12-18 DIAGNOSIS — D303 Benign neoplasm of bladder: Secondary | ICD-10-CM | POA: Diagnosis not present

## 2019-12-18 DIAGNOSIS — E785 Hyperlipidemia, unspecified: Secondary | ICD-10-CM | POA: Diagnosis not present

## 2019-12-18 DIAGNOSIS — Z833 Family history of diabetes mellitus: Secondary | ICD-10-CM | POA: Insufficient documentation

## 2019-12-18 DIAGNOSIS — Z87891 Personal history of nicotine dependence: Secondary | ICD-10-CM | POA: Insufficient documentation

## 2019-12-18 DIAGNOSIS — N2889 Other specified disorders of kidney and ureter: Secondary | ICD-10-CM | POA: Diagnosis not present

## 2019-12-18 DIAGNOSIS — Z83511 Family history of glaucoma: Secondary | ICD-10-CM | POA: Diagnosis not present

## 2019-12-18 DIAGNOSIS — R3129 Other microscopic hematuria: Secondary | ICD-10-CM | POA: Diagnosis present

## 2019-12-18 DIAGNOSIS — N1330 Unspecified hydronephrosis: Secondary | ICD-10-CM | POA: Insufficient documentation

## 2019-12-18 DIAGNOSIS — D494 Neoplasm of unspecified behavior of bladder: Secondary | ICD-10-CM | POA: Diagnosis not present

## 2019-12-18 DIAGNOSIS — Z8249 Family history of ischemic heart disease and other diseases of the circulatory system: Secondary | ICD-10-CM | POA: Diagnosis not present

## 2019-12-18 DIAGNOSIS — M1990 Unspecified osteoarthritis, unspecified site: Secondary | ICD-10-CM | POA: Diagnosis not present

## 2019-12-18 DIAGNOSIS — R3121 Asymptomatic microscopic hematuria: Secondary | ICD-10-CM | POA: Diagnosis not present

## 2019-12-18 DIAGNOSIS — N400 Enlarged prostate without lower urinary tract symptoms: Secondary | ICD-10-CM | POA: Diagnosis not present

## 2019-12-18 DIAGNOSIS — Z8551 Personal history of malignant neoplasm of bladder: Secondary | ICD-10-CM | POA: Insufficient documentation

## 2019-12-18 DIAGNOSIS — N3081 Other cystitis with hematuria: Secondary | ICD-10-CM | POA: Insufficient documentation

## 2019-12-18 HISTORY — DX: Dyspnea, unspecified: R06.00

## 2019-12-18 HISTORY — DX: Malignant (primary) neoplasm, unspecified: C80.1

## 2019-12-18 HISTORY — DX: Other skin changes: R23.8

## 2019-12-18 HISTORY — PX: CYSTOSCOPY W/ RETROGRADES: SHX1426

## 2019-12-18 HISTORY — PX: TRANSURETHRAL RESECTION OF BLADDER TUMOR WITH MITOMYCIN-C: SHX6459

## 2019-12-18 HISTORY — DX: Spontaneous ecchymoses: R23.3

## 2019-12-18 HISTORY — DX: Unspecified osteoarthritis, unspecified site: M19.90

## 2019-12-18 SURGERY — TRANSURETHRAL RESECTION OF BLADDER TUMOR WITH MITOMYCIN-C
Anesthesia: General

## 2019-12-18 MED ORDER — LIDOCAINE 2% (20 MG/ML) 5 ML SYRINGE
INTRAMUSCULAR | Status: DC | PRN
  Administered 2019-12-18: 40 mg via INTRAVENOUS

## 2019-12-18 MED ORDER — CEFAZOLIN SODIUM-DEXTROSE 2-4 GM/100ML-% IV SOLN
2.0000 g | INTRAVENOUS | Status: AC
  Administered 2019-12-18: 14:00:00 2 g via INTRAVENOUS
  Filled 2019-12-18: qty 100

## 2019-12-18 MED ORDER — ONDANSETRON HCL 4 MG/2ML IJ SOLN
INTRAMUSCULAR | Status: AC
  Filled 2019-12-18: qty 2

## 2019-12-18 MED ORDER — OXYCODONE HCL 5 MG PO TABS
ORAL_TABLET | ORAL | Status: AC
  Filled 2019-12-18: qty 1

## 2019-12-18 MED ORDER — OXYBUTYNIN CHLORIDE 5 MG PO TABS: 5.0000 mg | ORAL_TABLET | Freq: Three times a day (TID) | ORAL | 1 refills | Status: DC | PRN

## 2019-12-18 MED ORDER — FENTANYL CITRATE (PF) 100 MCG/2ML IJ SOLN
INTRAMUSCULAR | Status: AC
  Filled 2019-12-18: qty 2

## 2019-12-18 MED ORDER — CEFAZOLIN SODIUM-DEXTROSE 2-4 GM/100ML-% IV SOLN
INTRAVENOUS | Status: AC
  Filled 2019-12-18: qty 100

## 2019-12-18 MED ORDER — CEPHALEXIN 500 MG PO CAPS: 500.0000 mg | ORAL_CAPSULE | Freq: Two times a day (BID) | ORAL | 0 refills | Status: AC

## 2019-12-18 MED ORDER — ONDANSETRON HCL 4 MG/2ML IJ SOLN
4.0000 mg | Freq: Once | INTRAMUSCULAR | Status: AC | PRN
  Administered 2019-12-18: 4 mg via INTRAVENOUS
  Filled 2019-12-18: qty 2

## 2019-12-18 MED ORDER — ACETAMINOPHEN 500 MG PO TABS
ORAL_TABLET | ORAL | Status: AC
  Filled 2019-12-18: qty 2

## 2019-12-18 MED ORDER — PHENYLEPHRINE 40 MCG/ML (10ML) SYRINGE FOR IV PUSH (FOR BLOOD PRESSURE SUPPORT)
PREFILLED_SYRINGE | INTRAVENOUS | Status: DC | PRN
  Administered 2019-12-18 (×2): 80 ug via INTRAVENOUS

## 2019-12-18 MED ORDER — DEXAMETHASONE SODIUM PHOSPHATE 10 MG/ML IJ SOLN
INTRAMUSCULAR | Status: AC
  Filled 2019-12-18: qty 1

## 2019-12-18 MED ORDER — DEXAMETHASONE SODIUM PHOSPHATE 10 MG/ML IJ SOLN
INTRAMUSCULAR | Status: DC | PRN
  Administered 2019-12-18: 5 mg via INTRAVENOUS

## 2019-12-18 MED ORDER — ACETAMINOPHEN 500 MG PO TABS
1000.0000 mg | ORAL_TABLET | Freq: Once | ORAL | Status: AC
  Administered 2019-12-18: 1000 mg via ORAL
  Filled 2019-12-18: qty 2

## 2019-12-18 MED ORDER — PHENYLEPHRINE 40 MCG/ML (10ML) SYRINGE FOR IV PUSH (FOR BLOOD PRESSURE SUPPORT)
PREFILLED_SYRINGE | INTRAVENOUS | Status: AC
  Filled 2019-12-18: qty 10

## 2019-12-18 MED ORDER — OXYCODONE HCL 5 MG PO TABS
5.0000 mg | ORAL_TABLET | Freq: Once | ORAL | Status: AC
  Administered 2019-12-18: 5 mg via ORAL
  Filled 2019-12-18: qty 1

## 2019-12-18 MED ORDER — FENTANYL CITRATE (PF) 100 MCG/2ML IJ SOLN
25.0000 ug | INTRAMUSCULAR | Status: DC | PRN
  Administered 2019-12-18 (×2): 25 ug via INTRAVENOUS
  Filled 2019-12-18: qty 1

## 2019-12-18 MED ORDER — PROPOFOL 10 MG/ML IV BOLUS
INTRAVENOUS | Status: DC | PRN
  Administered 2019-12-18: 50 mg via INTRAVENOUS
  Administered 2019-12-18: 150 mg via INTRAVENOUS

## 2019-12-18 MED ORDER — LACTATED RINGERS IV SOLN
INTRAVENOUS | Status: DC
  Filled 2019-12-18: qty 1000

## 2019-12-18 MED ORDER — LIDOCAINE 2% (20 MG/ML) 5 ML SYRINGE
INTRAMUSCULAR | Status: AC
  Filled 2019-12-18: qty 5

## 2019-12-18 MED ORDER — FENTANYL CITRATE (PF) 100 MCG/2ML IJ SOLN
INTRAMUSCULAR | Status: DC | PRN
  Administered 2019-12-18 (×2): 25 ug via INTRAVENOUS
  Administered 2019-12-18: 75 ug via INTRAVENOUS
  Administered 2019-12-18: 25 ug via INTRAVENOUS

## 2019-12-18 MED ORDER — IOHEXOL 300 MG/ML  SOLN
INTRAMUSCULAR | Status: DC | PRN
  Administered 2019-12-18: 22 mL via URETHRAL

## 2019-12-18 MED ORDER — OXYCODONE HCL 5 MG PO TABS: 5.0000 mg | ORAL_TABLET | ORAL | 0 refills | Status: DC | PRN

## 2019-12-18 MED ORDER — SODIUM CHLORIDE 0.9 % IR SOLN
Status: DC | PRN
  Administered 2019-12-18 (×2): 3000 mL

## 2019-12-18 MED ORDER — GEMCITABINE CHEMO FOR BLADDER INSTILLATION 2000 MG
2000.0000 mg | Freq: Once | INTRAVENOUS | Status: AC
  Administered 2019-12-18: 2000 mg via INTRAVESICAL
  Filled 2019-12-18: qty 2000

## 2019-12-18 SURGICAL SUPPLY — 21 items
BAG DRAIN URO-CYSTO SKYTR STRL (DRAIN) ×3 IMPLANT
CATH FOLEY 2WAY SLVR  5CC 20FR (CATHETERS) ×3
CATH FOLEY 2WAY SLVR 5CC 20FR (CATHETERS) IMPLANT
CATH INTERMIT  6FR 70CM (CATHETERS) ×3 IMPLANT
CLOTH BEACON ORANGE TIMEOUT ST (SAFETY) ×3 IMPLANT
GLOVE BIO SURGEON STRL SZ8 (GLOVE) ×3 IMPLANT
GOWN STRL REUS W/ TWL XL LVL3 (GOWN DISPOSABLE) ×2 IMPLANT
GOWN STRL REUS W/TWL XL LVL3 (GOWN DISPOSABLE) ×6 IMPLANT
GUIDEWIRE STR DUAL SENSOR (WIRE) ×1 IMPLANT
IV NS IRRIG 3000ML ARTHROMATIC (IV SOLUTION) ×6 IMPLANT
KIT TURNOVER CYSTO (KITS) ×3 IMPLANT
LOOP CUT BIPOLAR 24F LRG (ELECTROSURGICAL) ×3 IMPLANT
MANIFOLD NEPTUNE II (INSTRUMENTS) ×3 IMPLANT
NS IRRIG 500ML POUR BTL (IV SOLUTION) ×3 IMPLANT
PACK CYSTO (CUSTOM PROCEDURE TRAY) ×3 IMPLANT
PLUG CATH AND CAP STER (CATHETERS) ×1 IMPLANT
STENT URET 6FRX24 CONTOUR (STENTS) ×1 IMPLANT
SYR TOOMEY IRRIG 70ML (MISCELLANEOUS) ×3
SYRINGE TOOMEY IRRIG 70ML (MISCELLANEOUS) ×2 IMPLANT
TUBE CONNECTING 12X1/4 (SUCTIONS) ×3 IMPLANT
TUBING UROLOGY SET (TUBING) ×1 IMPLANT

## 2019-12-18 NOTE — Discharge Instructions (Signed)
1. You may see some blood in the urine and may have some burning with urination for 48-72 hours. You also may notice that you have to urinate more frequently or urgently after your procedure which is normal.  2. You should call should you develop an inability urinate, fever > 101, persistent nausea and vomiting that prevents you from eating or drinking to stay hydrated.  3. If you have a stent, you will likely urinate more frequently and urgently until the stent is removed and you may experience some discomfort/pain in the lower abdomen and flank especially when urinating. You may take pain medication prescribed to you if needed for pain. You may also intermittently have blood in the urine until the stent is removed. 4. If you have a catheter, you will be taught how to take care of the catheter by the nursing staff prior to discharge from the hospital.  You may periodically feel a strong urge to void with the catheter in place.  This is a bladder spasm and most often can occur when having a bowel movement or moving around. It is typically self-limited and usually will stop after a few minutes.  You may use some Vaseline or Neosporin around the tip of the catheter to reduce friction at the tip of the penis. You may also see some blood in the urine.  A very small amount of blood can make the urine look quite red.  As long as the catheter is draining well, there usually is not a problem.  However, if the catheter is not draining well and is bloody, you should call the office [phone]) to notify us.  It is okay to remove the catheter as instructed by the nurse if you are doing okay from the urinary standpoint on Friday morning.  Post Anesthesia Home Care Instructions  Activity: Get plenty of rest for the remainder of the day. A responsible adult should stay with you for 24 hours following the procedure.  For the next 24 hours, DO NOT: -Drive a car -Paediatric nurse -Drink alcoholic beverages -Take any  medication unless instructed by your physician -Make any legal decisions or sign important papers.  Meals: Start with liquid foods such as gelatin or soup. Progress to regular foods as tolerated. Avoid greasy, spicy, heavy foods. If nausea and/or vomiting occur, drink only clear liquids until the nausea and/or vomiting subsides. Call your physician if vomiting continues.  Special Instructions/Symptoms: Your throat may feel dry or sore from the anesthesia or the breathing tube placed in your throat during surgery. If this causes discomfort, gargle with warm salt water. The discomfort should disappear within 24 hours.  If you had a scopolamine patch placed behind your ear for the management of post- operative nausea and/or vomiting:  1. The medication in the patch is effective for 72 hours, after which it should be removed.  Wrap patch in a tissue and discard in the trash. Wash hands thoroughly with soap and water. 2. You may remove the patch earlier than 72 hours if you experience unpleasant side effects which may include dry mouth, dizziness or visual disturbances. 3. Avoid touching the patch. Wash your hands with soap and water after contact with the patch.

## 2019-12-18 NOTE — Anesthesia Procedure Notes (Signed)
Procedure Name: LMA Insertion Date/Time: 12/18/2019 2:06 PM Performed by: Suan Halter, CRNA Pre-anesthesia Checklist: Patient identified, Emergency Drugs available, Suction available and Patient being monitored Patient Re-evaluated:Patient Re-evaluated prior to induction Oxygen Delivery Method: Circle system utilized Preoxygenation: Pre-oxygenation with 100% oxygen Induction Type: IV induction Ventilation: Mask ventilation without difficulty LMA: LMA inserted LMA Size: 4.0 Number of attempts: 1 Airway Equipment and Method: Bite block Placement Confirmation: positive ETCO2 Tube secured with: Tape Dental Injury: Teeth and Oropharynx as per pre-operative assessment

## 2019-12-18 NOTE — Transfer of Care (Signed)
Immediate Anesthesia Transfer of Care Note  Patient: Marc Flores  Procedure(s) Performed: TRANSURETHRAL RESECTION OF BLADDER TUMOR, RIGHT URETEROSCOPY AND RIGHT URETERAL STENT PLACEMENT (N/A ) CYSTOSCOPY WITH RETROGRADE PYELOGRAM (Bilateral )  Patient Location: PACU  Anesthesia Type:General  Level of Consciousness: awake, alert , oriented and patient cooperative  Airway & Oxygen Therapy: Patient Spontanous Breathing and Patient connected to nasal cannula oxygen  Post-op Assessment: Report given to RN and Post -op Vital signs reviewed and stable  Post vital signs: Reviewed and stable  Last Vitals:  Vitals Value Taken Time  BP    Temp    Pulse 84 12/18/19 1451  Resp 11 12/18/19 1451  SpO2 99 % 12/18/19 1451  Vitals shown include unvalidated device data.  Last Pain:  Vitals:   12/18/19 1211  TempSrc: Oral  PainSc: 4          Complications: No apparent anesthesia complications

## 2019-12-18 NOTE — H&P (Signed)
H&P  Chief Complaint: Bladder cancer  History of Present Illness: 79 year old male initially seen for penile/pelvic pain.  He additionally had microscopic hematuria.  Evaluation revealed upper tracts on CT scan, cystoscopy revealed 10 to 12 mm papillary lesion in the right trigonal region.  No other bladder abnormalities were seen.  He presents at this time for cystoscopy as well as transurethral resection of bladder tumor/postoperative instillation of gemcitabine.  I discussed the procedure with the patient as well as risks and complications.  He is more concerned about his persistent penile and pelvic pain.  Because of this, I have also added bilateral retrograde pyelograms to the study  Past Medical History:  Diagnosis Date  . Arthritis   . Bruises easily   . Cancer (HCC)    BLADDER  . COPD (chronic obstructive pulmonary disease) ([place])   . Dyspnea    ON EXERTION    Past Surgical History:  Procedure Laterality Date  . CYSTOSCOPY  11/27/2019   IN [facility]  . TONSILLECTOMY  AS CHILD  . WISDOM TOOTH EXTRACTION  AS CHILD    Home Medications:  2  Allergies: No Known Allergies  Family History  Problem Relation Age of Onset  . Glaucoma Mother   . Diabetes Mother   . Heart disease Father   . Atrial fibrillation Father     Social History:  reports that he quit smoking about 28 years ago. His smoking use included cigarettes. He has a 30.00 pack-year smoking history. He has never used smokeless tobacco. He reports previous alcohol use. He reports that he does not use drugs.  ROS: A complete review of systems was performed.  All systems are negative except for pertinent findings as noted.  Physical Exam:  Vital signs in last 24 hours: BP (!) 159/79   Pulse 82   Temp 97.6 F (36.4 C) (Oral)   Resp (!) 24   Ht 5\' 6"  (1.676 m)   Wt 79.7 kg   SpO2 98%   BMI 28.37 kg/m  Constitutional:  Alert and oriented, No acute distress Cardiovascular: Regular rate  Respiratory:  Normal respiratory effort GI: Abdomen is soft, nontender, nondistended, no abdominal masses. No CVAT.  Genitourinary: Normal male phallus, testes are descended bilaterally and non-tender and without masses, scrotum is normal in appearance without lesions or masses, perineum is normal on inspection. Lymphatic: No lymphadenopathy Neurologic: Grossly intact, no focal deficits Psychiatric: Normal mood and affect  Laboratory Data:  No results for input(s): WBC, HGB, HCT, PLT in the last 72 hours.  No results for input(s): NA, K, CL, GLUCOSE, BUN, CALCIUM, CREATININE in the last 72 hours.  Invalid input(s): CO3   No results found for this or any previous visit (from the past 24 hour(s)). Recent Results (from the past 240 hour(s))  SARS CORONAVIRUS 2 (TAT 6-24 HRS) Nasopharyngeal Nasopharyngeal Swab     Status: None   Collection Time: 12/15/19 11:20 AM   Specimen: Nasopharyngeal Swab  Result Value Ref Range Status   SARS Coronavirus 2 NEGATIVE NEGATIVE Final    Comment: (NOTE) SARS-CoV-2 target nucleic acids are NOT DETECTED. The SARS-CoV-2 RNA is generally detectable in upper and lower respiratory specimens during the acute phase of infection. Negative results do not preclude SARS-CoV-2 infection, do not rule out co-infections with other pathogens, and should not be used as the sole basis for treatment or other patient management decisions. Negative results must be combined with clinical observations, patient history, and epidemiological information. The expected result is Negative. Fact  Sheet for Patients: SugarRoll.be Fact Sheet for Healthcare Providers: https://www.woods-mathews.com/ This test is not yet approved or cleared by the [place] FDA and  has been authorized for detection and/or diagnosis of SARS-CoV-2 by FDA under an Emergency Use Authorization (EUA). This EUA will remain  in effect (meaning this test can be used) for the  duration of the COVID-19 declaration under Section 56 4(b)(1) of the Act, 21 U.S.C. section 360bbb-3(b)(1), unless the authorization is terminated or revoked sooner. Performed at [facility], [place] [address] [place], [place] [address]     Renal Function: No results for input(s): CREATININE in the last 168 hours. CrCl cannot be calculated (Patient's most recent lab result is older than the maximum 21 days allowed.).  Radiologic Imaging: No results found.  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

## 2019-12-18 NOTE — Hospital Course (Signed)
H&P  Chief Complaint: Bladder cancer  History of Present Illness: 79 year old male initially seen for penile/pelvic pain.  He additionally had microscopic hematuria.  Evaluation revealed upper tracts on CT scan, cystoscopy revealed 10 to 12 mm papillary lesion in the right trigonal region.  No other bladder abnormalities were seen.  He presents at this time for cystoscopy as well as transurethral resection of bladder tumor/postoperative instillation of gemcitabine.  I discussed the procedure with the patient as well as risks and complications.  He is more concerned about his persistent penile and pelvic pain.  Because of this, I have also added bilateral retrograde pyelograms to the study  Past Medical History:  Diagnosis Date   Arthritis    Bruises easily    Cancer ([place])    BLADDER   COPD (chronic obstructive pulmonary disease) (HCC)    Dyspnea    ON EXERTION    Past Surgical History:  Procedure Laterality Date   CYSTOSCOPY  11/27/2019   IN [facility]   TONSILLECTOMY  AS CHILD   WISDOM TOOTH EXTRACTION  AS CHILD    Home Medications:  2  Allergies: No Known Allergies  Family History  Problem Relation Age of Onset   Glaucoma Mother    Diabetes Mother    Heart disease Father    Atrial fibrillation Father     Social History:  reports that he quit smoking about 28 years ago. His smoking use included cigarettes. He has a 30.00 pack-year smoking history. He has never used smokeless tobacco. He reports previous alcohol use. He reports that he does not use drugs.  ROS: A complete review of systems was performed.  All systems are negative except for pertinent findings as noted.  Physical Exam:  Vital signs in last 24 hours: BP (!) 159/79   Pulse 82   Temp 97.6 F (36.4 C) (Oral)   Resp (!) 24   Ht 5\' 6"  (1.676 m)   Wt 79.7 kg   SpO2 98%   BMI 28.37 kg/m  Constitutional:  Alert and oriented, No acute distress Cardiovascular: Regular rate  Respiratory: Normal  respiratory effort GI: Abdomen is soft, nontender, nondistended, no abdominal masses. No CVAT.  Genitourinary: Normal male phallus, testes are descended bilaterally and non-tender and without masses, scrotum is normal in appearance without lesions or masses, perineum is normal on inspection. Lymphatic: No lymphadenopathy Neurologic: Grossly intact, no focal deficits Psychiatric: Normal mood and affect  Laboratory Data:  No results for input(s): WBC, HGB, HCT, PLT in the last 72 hours.  No results for input(s): NA, K, CL, GLUCOSE, BUN, CALCIUM, CREATININE in the last 72 hours.  Invalid input(s): CO3   No results found for this or any previous visit (from the past 24 hour(s)). Recent Results (from the past 240 hour(s))  SARS CORONAVIRUS 2 (TAT 6-24 HRS) Nasopharyngeal Nasopharyngeal Swab     Status: None   Collection Time: 12/15/19 11:20 AM   Specimen: Nasopharyngeal Swab  Result Value Ref Range Status   SARS Coronavirus 2 NEGATIVE NEGATIVE Final    Comment: (NOTE) SARS-CoV-2 target nucleic acids are NOT DETECTED. The SARS-CoV-2 RNA is generally detectable in upper and lower respiratory specimens during the acute phase of infection. Negative results do not preclude SARS-CoV-2 infection, do not rule out co-infections with other pathogens, and should not be used as the sole basis for treatment or other patient management decisions. Negative results must be combined with clinical observations, patient history, and epidemiological information. The expected result is Negative. Fact  Sheet for Patients: SugarRoll.be Fact Sheet for Healthcare Providers: https://www.woods-mathews.com/ This test is not yet approved or cleared by the [place] FDA and  has been authorized for detection and/or diagnosis of SARS-CoV-2 by FDA under an Emergency Use Authorization (EUA). This EUA will remain  in effect (meaning this test can be used) for the duration  of the COVID-19 declaration under Section 56 4(b)(1) of the Act, 21 U.S.C. section 360bbb-3(b)(1), unless the authorization is terminated or revoked sooner. Performed at [facility], [place] [address] [place], [place] [address]     Renal Function: No results for input(s): CREATININE in the last 168 hours. CrCl cannot be calculated (Patient's most recent lab result is older than the maximum 21 days allowed.).  Radiologic Imaging: No results found.  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

## 2019-12-18 NOTE — Op Note (Signed)
Preoperative diagnosis: Bladder tumor, microscopic hematuria  Postoperative diagnosis: Inflammatory lesion around right ureteral orifice, right hydroureteronephrosis  Principal procedure: Cystoscopy, bilateral retrograde ureteropyelogram's, fluoroscopic interpretation, right ureteroscopy, placement of 6 French by 24 cm contour double-J stent without tether, transurethral resection of bladder lesion, 20 mm in size, placement of intravesical gemcitabine  Surgeon: Corrin Sieling  Anesthesia: General with LMA  Complications: None  Specimen: Bladder tumor, to pathology  Drains: 18 French Foley catheter, 24 cm x 6 French contour double-J stent without tether  Estimated blood loss: Less than 25 mL  Indications: 79 year old male with persistent penile pain.  He had CT scan about 2 years ago revealing normal upper tracts and bladder.  Recent cystoscopy revealed a lesion around his right ureteral orifice approximately 10 to 12 mm in size.  He has not had recent imaging.  He presents at this time for cystoscopy, transurethral resection of bladder tumor, bilateral retrograde pyelograms and placement of intravesical gemcitabine postoperatively.  I discussed the procedure with the patient, risks and complications were also covered.  He understands these and desires to proceed.  Findings: Urethra was normal.  Prostate moderately obstructive with bilobar hypertrophy.  Left ureteral orifice was normal in configuration location.  There was an inflammatory process around the right ureteral orifice which was somewhat disguised within this inflammatory process.  This appeared more inflammatory in nature than neoplastic.  The remaining urothelium within the bladder was normal.  Bilateral retrograde ureteral pyelograms were performed.  The left ureter and pyelocalyceal system were totally normal.  On the right, there was generalized hydroureteronephrosis with pyelocaliectasis.  No filling defects were seen along the course  of the ureter, but the obstruction was seemingly right at the ureterovesical junction.  Ureteroscopic exam of the right ureter was performed up to the UPJ.  This revealed no intraluminal abnormalities.  There was some inflammation in the last 3 to 4 mm of the ureteral orifice but no discrete papillary lesions were seen.  Description of procedure: The patient was properly identified in the holding area.  He received preoperative IV antibiotics.  Was taken to the operating room where general anesthetic was administered with the LMA.  He was placed in the dorsolithotomy position.  Genitalia and perineum were prepped and draped.  Proper timeout was performed.  21 French panendoscope advanced into the bladder using the Foroblique lens.  The above-mentioned findings were noted.  Bilateral retrograde ureteral pyelograms were performed, again with the findings seen to  Stop.  There was significant right hydroureteronephrosis.  I passed a guidewire through the open-ended catheter into the upper pole calyceal system on the right.  The open-ended catheter and cystoscope were then removed.  I passed a 4-1/2 [place] single-lumen ureteroscope up the urethra, into the bladder and into the right ureter where inspection was performed all the way up to the right ureteropelvic junction.  I saw no specific lesions.  There was inflammatory tissue with constriction at the ureterovesical junction.  This was not biopsied as it was adjacent to the bladder that I was going to be resecting.  Knowing that I would be resecting around the ureteral orifice, after the ureteroscope was removed, I passed, over top of the guidewire using cystoscope, a 24 cm x 6 French contour double-J stent without tether.  Adequate positioning was seen using fluoroscopy and cystoscopy.  At this point the cystoscope was removed.  I then passed the 26 French resectoscope sheath using the visual obturator.  The resectoscope and cutting loop were placed.  I  resected the inflammatory tissue starting lateral and superior to the right ureteral orifice, circumferentially around the orifice and then more medially and inferiorly into the trigone.  Approximately 20 mm of resection was performed, well into the muscular layer.  There was slight damage to the double-J stent, but this was still intact and I left it in place.  Small resected fragments were collected and sent for pathology.  The resected bed was carefully controlled using electrocautery such that no bleeding was seen following this.  There were no other lesions in the bladder at this point.  The scope was removed.  The bladder was drained with a 20 French Foley catheter.  The catheter was plugged, the patient was awakened and taken to the PACU in stable condition.  He tolerated the procedure well.  In the PACU, the catheter was again drained, the plug removed, and approximately 50 mL of bili went with 2 g of gemcitabine were placed.  It was left indwelling for an hour and then drained.  The patient will be called with his pathology results.  Follow-up as scheduled for May 14.

## 2019-12-18 NOTE — Anesthesia Preprocedure Evaluation (Addendum)
Anesthesia Evaluation  Patient identified by MRN, date of birth, ID band Patient awake    Reviewed: Allergy & Precautions, NPO status , Patient's Chart, lab work & pertinent test results  Airway Mallampati: II  TM Distance: >3 FB Neck ROM: Full    Dental no notable dental hx.    Pulmonary COPD,  COPD inhaler, former smoker,    Pulmonary exam normal breath sounds clear to auscultation       Cardiovascular negative cardio ROS Normal cardiovascular exam Rhythm:Regular Rate:Normal     Neuro/Psych negative neurological ROS  negative psych ROS   GI/Hepatic negative GI ROS, Neg liver ROS,   Endo/Other  negative endocrine ROS  Renal/GU negative Renal ROS     Musculoskeletal  (+) Arthritis ,   Abdominal   Peds  Hematology HLD   Anesthesia Other Findings BLADDER CANCER  Reproductive/Obstetrics                            Anesthesia Physical Anesthesia Plan  ASA: III  Anesthesia Plan: General   Post-op Pain Management:    Induction: Intravenous  PONV Risk Score and Plan: 3 and Ondansetron, Dexamethasone and Treatment may vary due to age or medical condition  Airway Management Planned: LMA  Additional Equipment:   Intra-op Plan:   Post-operative Plan: Extubation in OR  Informed Consent: I have reviewed the patients History and Physical, chart, labs and discussed the procedure including the risks, benefits and alternatives for the proposed anesthesia with the patient or authorized representative who has indicated his/her understanding and acceptance.     Dental advisory given  Plan Discussed with: CRNA  Anesthesia Plan Comments:        Anesthesia Quick Evaluation

## 2019-12-19 LAB — SURGICAL PATHOLOGY

## 2019-12-19 NOTE — Anesthesia Postprocedure Evaluation (Signed)
Anesthesia Post Note  Patient: Marc Flores  Procedure(s) Performed: TRANSURETHRAL RESECTION OF BLADDER TUMOR, RIGHT URETEROSCOPY AND RIGHT URETERAL STENT PLACEMENT (N/A ) CYSTOSCOPY WITH RETROGRADE PYELOGRAM (Bilateral )     Patient location during evaluation: PACU Anesthesia Type: General Level of consciousness: awake and alert Pain management: pain level controlled Vital Signs Assessment: post-procedure vital signs reviewed and stable Respiratory status: spontaneous breathing, nonlabored ventilation, respiratory function stable and patient connected to nasal cannula oxygen Cardiovascular status: blood pressure returned to baseline and stable Postop Assessment: no apparent nausea or vomiting Anesthetic complications: no    Last Vitals:  Vitals:   12/18/19 1700 12/18/19 1735  BP:  (!) 142/74  Pulse: 81 82  Resp: 12 16  Temp:  36.7 C  SpO2: 93% 94%    Last Pain:  Vitals:   12/18/19 1735  TempSrc: Oral  PainSc: 3                  Sandhya Denherder P Ferrin Liebig

## 2019-12-30 ENCOUNTER — Other Ambulatory Visit: Payer: Self-pay | Admitting: Internal Medicine

## 2019-12-30 MED ORDER — TRELEGY ELLIPTA 100-62.5-25 MCG/INH IN AEPB: 1.0000 | INHALATION_SPRAY | Freq: Every day | RESPIRATORY_TRACT | 11 refills | Status: DC

## 2020-01-01 DIAGNOSIS — D414 Neoplasm of uncertain behavior of bladder: Secondary | ICD-10-CM | POA: Diagnosis not present

## 2020-01-01 DIAGNOSIS — N130 Hydronephrosis with ureteropelvic junction obstruction: Secondary | ICD-10-CM | POA: Diagnosis not present

## 2020-01-16 DIAGNOSIS — D414 Neoplasm of uncertain behavior of bladder: Secondary | ICD-10-CM | POA: Diagnosis not present

## 2020-01-16 DIAGNOSIS — N130 Hydronephrosis with ureteropelvic junction obstruction: Secondary | ICD-10-CM | POA: Diagnosis not present

## 2020-01-30 DIAGNOSIS — Z1211 Encounter for screening for malignant neoplasm of colon: Secondary | ICD-10-CM | POA: Diagnosis not present

## 2020-01-30 DIAGNOSIS — Z8551 Personal history of malignant neoplasm of bladder: Secondary | ICD-10-CM | POA: Diagnosis not present

## 2020-01-30 DIAGNOSIS — E785 Hyperlipidemia, unspecified: Secondary | ICD-10-CM | POA: Diagnosis not present

## 2020-01-30 DIAGNOSIS — J449 Chronic obstructive pulmonary disease, unspecified: Secondary | ICD-10-CM | POA: Diagnosis not present

## 2020-01-30 DIAGNOSIS — I700 Atherosclerosis of aorta: Secondary | ICD-10-CM | POA: Diagnosis not present

## 2020-01-30 DIAGNOSIS — M109 Gout, unspecified: Secondary | ICD-10-CM | POA: Diagnosis not present

## 2020-01-30 DIAGNOSIS — Z0000 Encounter for general adult medical examination without abnormal findings: Secondary | ICD-10-CM | POA: Diagnosis not present

## 2020-03-17 DIAGNOSIS — D414 Neoplasm of uncertain behavior of bladder: Secondary | ICD-10-CM | POA: Diagnosis not present

## 2020-03-17 DIAGNOSIS — N130 Hydronephrosis with ureteropelvic junction obstruction: Secondary | ICD-10-CM | POA: Diagnosis not present

## 2020-03-24 DIAGNOSIS — R0602 Shortness of breath: Secondary | ICD-10-CM | POA: Diagnosis not present

## 2020-03-24 DIAGNOSIS — Z86010 Personal history of colonic polyps: Secondary | ICD-10-CM | POA: Diagnosis not present

## 2020-03-24 DIAGNOSIS — J449 Chronic obstructive pulmonary disease, unspecified: Secondary | ICD-10-CM | POA: Diagnosis not present

## 2020-04-13 DIAGNOSIS — Z86010 Personal history of colonic polyps: Secondary | ICD-10-CM | POA: Diagnosis not present

## 2020-04-13 DIAGNOSIS — K5730 Diverticulosis of large intestine without perforation or abscess without bleeding: Secondary | ICD-10-CM | POA: Diagnosis not present

## 2020-04-13 DIAGNOSIS — D122 Benign neoplasm of ascending colon: Secondary | ICD-10-CM | POA: Diagnosis not present

## 2020-04-13 DIAGNOSIS — K635 Polyp of colon: Secondary | ICD-10-CM | POA: Diagnosis not present

## 2020-04-13 HISTORY — PX: COLONOSCOPY: SHX174

## 2020-04-14 ENCOUNTER — Telehealth: Payer: Self-pay | Admitting: Family Medicine

## 2020-04-14 NOTE — Telephone Encounter (Signed)
Yes I can see him thanks

## 2020-04-14 NOTE — Telephone Encounter (Signed)
This is one of your pts Marc Flores) father and they came by and stated that you would accept him as your pt.  Will you accept him as your pt?

## 2020-04-15 NOTE — Telephone Encounter (Signed)
Pt was called and scheduled !

## 2020-04-15 NOTE — Telephone Encounter (Signed)
Noted. Nothing further needed. 

## 2020-04-16 ENCOUNTER — Encounter: Payer: Self-pay | Admitting: Family Medicine

## 2020-04-16 ENCOUNTER — Ambulatory Visit (INDEPENDENT_AMBULATORY_CARE_PROVIDER_SITE_OTHER): Payer: PPO | Admitting: Family Medicine

## 2020-04-16 ENCOUNTER — Other Ambulatory Visit: Payer: Self-pay

## 2020-04-16 VITALS — BP 140/60 | HR 118 | Temp 98.0°F | Wt 170.8 lb

## 2020-04-16 DIAGNOSIS — J209 Acute bronchitis, unspecified: Secondary | ICD-10-CM

## 2020-04-16 DIAGNOSIS — J440 Chronic obstructive pulmonary disease with acute lower respiratory infection: Secondary | ICD-10-CM

## 2020-04-16 MED ORDER — AZITHROMYCIN 250 MG PO TABS: ORAL_TABLET | ORAL | 0 refills | Status: DC

## 2020-04-16 MED ORDER — METHYLPREDNISOLONE 4 MG PO TBPK: ORAL_TABLET | ORAL | 0 refills | Status: DC

## 2020-04-16 NOTE — Progress Notes (Signed)
   Subjective:    Patient ID: Marc Flores, male    DOB: August 02, 1941, 79 y.o.   MRN: [id number]  HPI Here for 3 days of PND, ST, and coughing up yellow sputum. No fever or chest pain . He has mild SOB due to a hx of COPD. He uses Ellipta daily and rescue albuterol prn. No loss of taste or smell. No NVD. He is fully vaccinated against the Covid virus.    Review of Systems  Constitutional: Negative.   Respiratory: Positive for cough, shortness of breath and wheezing.   Cardiovascular: Negative.   Gastrointestinal: Negative.        Objective:   Physical Exam Constitutional:      Appearance: Normal appearance. He is well-developed. He is not ill-appearing.  HENT:     Right Ear: Tympanic membrane, ear canal and external ear normal.     Left Ear: Tympanic membrane, ear canal and external ear normal.     Nose: Nose normal.     Mouth/Throat:     Pharynx: Oropharynx is clear.  Eyes:     Conjunctiva/sclera: Conjunctivae normal.  Cardiovascular:     Rate and Rhythm: Normal rate and regular rhythm.     Pulses: Normal pulses.     Heart sounds: Normal heart sounds.  Pulmonary:     Effort: Pulmonary effort is normal. No respiratory distress.     Breath sounds: Rhonchi present. No wheezing or rales.  Lymphadenopathy:     Cervical: No cervical adenopathy.  Neurological:     Mental Status: He is alert.           Assessment & Plan:  Bronchitis, treat with a Zpack and a Medrol dose pack.  Alysia Penna, MD

## 2020-05-11 ENCOUNTER — Encounter: Payer: Self-pay | Admitting: Family Medicine

## 2020-05-11 ENCOUNTER — Other Ambulatory Visit: Payer: Self-pay

## 2020-05-11 ENCOUNTER — Ambulatory Visit (INDEPENDENT_AMBULATORY_CARE_PROVIDER_SITE_OTHER): Payer: PPO | Admitting: Family Medicine

## 2020-05-11 VITALS — BP 120/78 | HR 62 | Temp 97.6°F | Ht 66.0 in | Wt 173.2 lb

## 2020-05-11 DIAGNOSIS — E785 Hyperlipidemia, unspecified: Secondary | ICD-10-CM | POA: Insufficient documentation

## 2020-05-11 DIAGNOSIS — J449 Chronic obstructive pulmonary disease, unspecified: Secondary | ICD-10-CM

## 2020-05-11 DIAGNOSIS — M1A9XX0 Chronic gout, unspecified, without tophus (tophi): Secondary | ICD-10-CM

## 2020-05-11 DIAGNOSIS — M109 Gout, unspecified: Secondary | ICD-10-CM | POA: Insufficient documentation

## 2020-05-11 NOTE — Progress Notes (Signed)
   Subjective:    Patient ID: Marc Flores, male    DOB: 11/11/1940, 79 y.o.   MRN: [id number]  HPI Here to establish with [place]. We saw him on 04-16-20 for a bronchitis, and this responded well to a Zpack. He has been seeing Dr. London Pepper for primary care before coming to [place]. His last physical was in April, and he reports that all his lab results were acceptable, including lipids. He had a colonoscopy on 04-13-20. He sees Dr. Melvyn Novas regularly for COPD. He has had both Covid vaccines and 2 pneumonia vaccines, but he declines any flu shots. He is retired from a career working in [employer], operational.    Review of Systems  Constitutional: Negative.   Respiratory: Negative.   Cardiovascular: Negative.   Gastrointestinal: Negative.   Genitourinary: Negative.   Neurological: Negative.        Objective:   Physical Exam Constitutional:      Appearance: Normal appearance.  Cardiovascular:     Rate and Rhythm: Normal rate and regular rhythm.     Pulses: Normal pulses.     Heart sounds: Normal heart sounds.  Pulmonary:     Effort: Pulmonary effort is normal.     Breath sounds: Normal breath sounds.  Lymphadenopathy:     Cervical: No cervical adenopathy.  Neurological:     General: No focal deficit present.     Mental Status: He is alert and oriented to person, place, and time.           Assessment & Plan:  Intro visit for this patient who seems to be doing well with active problems including dyslipidemia, gout, and COPD.  Alysia Penna, MD

## 2020-09-13 DIAGNOSIS — H5201 Hypermetropia, right eye: Secondary | ICD-10-CM | POA: Diagnosis not present

## 2020-09-13 DIAGNOSIS — H04123 Dry eye syndrome of bilateral lacrimal glands: Secondary | ICD-10-CM | POA: Diagnosis not present

## 2020-09-13 DIAGNOSIS — Z961 Presence of intraocular lens: Secondary | ICD-10-CM | POA: Diagnosis not present

## 2020-09-27 ENCOUNTER — Telehealth: Payer: Self-pay | Admitting: Family Medicine

## 2020-09-27 NOTE — Telephone Encounter (Signed)
Pt scheduled for [facility] Video visit on 09/28/2020

## 2020-09-27 NOTE — Telephone Encounter (Signed)
Patient is requesting a refill on his Colchicine 0.6 mg.  Pharmacy: [facility]  Please advise.

## 2020-09-28 ENCOUNTER — Telehealth (INDEPENDENT_AMBULATORY_CARE_PROVIDER_SITE_OTHER): Payer: PPO | Admitting: Family Medicine

## 2020-09-28 ENCOUNTER — Encounter: Payer: Self-pay | Admitting: Family Medicine

## 2020-09-28 VITALS — Ht 67.0 in | Wt 175.0 lb

## 2020-09-28 DIAGNOSIS — M1A9XX0 Chronic gout, unspecified, without tophus (tophi): Secondary | ICD-10-CM

## 2020-09-28 MED ORDER — COLCHICINE 0.6 MG PO TABS: ORAL_TABLET | ORAL | 5 refills | Status: DC

## 2020-09-28 NOTE — Progress Notes (Signed)
Subjective:    Patient ID: Marc Flores, male    DOB: 10-Sep-1940, 80 y.o.   MRN: [id number]  HPI Virtual Visit via Video Note  I connected with the patient on 09/28/20 at  1:15 PM EST by a video enabled telemedicine application and verified that I am speaking with the correct person using two identifiers.  Location patient: home Location provider:work or home office Persons participating in the virtual visit: patient, provider  I discussed the limitations of evaluation and management by telemedicine and the availability of in person appointments. The patient expressed understanding and agreed to proceed.   HPI: Here for 3 days of swelling and pain in the left foot which is typical of his gout attacks. He asks for a refill on Colchicine, which generally works well for him.    ROS: See pertinent positives and negatives per HPI.  Past Medical History:  Diagnosis Date  . Arthritis   . Bruises easily   . 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 Diona Fanti  . COPD (chronic obstructive pulmonary disease) ([place])    sees Dr. Melvyn Novas    . Diverticulitis   . Dyspnea    ON EXERTION  . History of chickenpox   . Hyperlipidemia     Past Surgical History:  Procedure Laterality Date  . COLONOSCOPY  04/13/2020   per Dr. Carol Ada, adenomaotus polyps, repeat in 7 yrs   . CYSTOSCOPY  11/27/2019   IN [facility]  . CYSTOSCOPY W/ RETROGRADES Bilateral 12/18/2019   Procedure: CYSTOSCOPY WITH RETROGRADE PYELOGRAM;  Surgeon: Franchot Gallo, MD;  Location: [facility];  Service: Urology;  Laterality: Bilateral;  . TONSILLECTOMY  AS CHILD  . TRANSURETHRAL RESECTION OF BLADDER TUMOR WITH MITOMYCIN-C N/A 12/18/2019   Procedure: TRANSURETHRAL RESECTION OF BLADDER TUMOR, RIGHT URETEROSCOPY AND RIGHT URETERAL STENT PLACEMENT;  Surgeon: Franchot Gallo, MD;  Location: [facility];  Service: Urology;  Laterality: N/A;  . WISDOM TOOTH EXTRACTION  AS CHILD     Family History  Problem Relation Age of Onset  . Glaucoma Mother   . Diabetes Mother   . Hearing loss Mother   . Heart disease Mother   . Heart disease Father   . Atrial fibrillation Father   . Arthritis Father   . Cancer Father   . Hearing loss Father   . Hypertension Father      Current Outpatient Medications:  .  atorvastatin (LIPITOR) 10 MG tablet, Take 10 mg by mouth daily., Disp: , Rfl:  .  colchicine 0.6 MG tablet, Take 2 tablets twice a day as needed for gout attacks, Disp: 60 tablet, Rfl: 5 .  Fluticasone-Umeclidin-Vilant (TRELEGY ELLIPTA) 100-62.5-25 MCG/INH AEPB, Inhale 1 puff into the lungs daily. 1 PUFF IN AM, Disp: 60 each, Rfl: 11 .  PROAIR HFA 108 (90 Base) MCG/ACT inhaler, Inhale 2 puffs into the lungs as directed., Disp: 8.5 g, Rfl: 1 .  tamsulosin (FLOMAX) 0.4 MG CAPS capsule, Take 1 capsule by mouth daily after supper. , Disp: , Rfl:  No current facility-administered medications for this visit.  Facility-Administered Medications Ordered in Other Visits:  .  gemcitabine (GEMZAR) chemo syringe for bladder instillation 2,000 mg, 2,000 mg, Bladder Instillation, Once, Franchot Gallo, MD  EXAMTonette Bihari per patient if applicable:  GENERAL: alert, oriented, appears well and in no acute distress  HEENT: atraumatic, conjunttiva clear, no obvious abnormalities on inspection of external nose and ears  NECK: normal movements of the head and neck  LUNGS: on inspection no signs of respiratory distress, breathing rate appears normal, no obvious gross SOB, gasping or wheezing  CV: no obvious cyanosis  MS: moves all visible extremities without noticeable abnormality  PSYCH/NEURO: pleasant and cooperative, no obvious depression or anxiety, speech and thought processing grossly intact  ASSESSMENT AND PLAN: Gout, refilled the Colchicine to take 2 tabs BID as needed.  Alysia Penna, MD   Discussed the following assessment and plan:  No diagnosis found.      I discussed the assessment and treatment plan with the patient. The patient was provided an opportunity to ask questions and all were answered. The patient agreed with the plan and demonstrated an understanding of the instructions.   The patient was advised to call back or seek an in-person evaluation if the symptoms worsen or if the condition fails to improve as anticipated.     Review of Systems     Objective:   Physical Exam        Assessment & Plan:

## 2020-09-30 DIAGNOSIS — H43811 Vitreous degeneration, right eye: Secondary | ICD-10-CM | POA: Diagnosis not present

## 2020-11-02 ENCOUNTER — Telehealth: Payer: Self-pay | Admitting: Family Medicine

## 2020-11-02 DIAGNOSIS — H43811 Vitreous degeneration, right eye: Secondary | ICD-10-CM | POA: Diagnosis not present

## 2020-11-02 NOTE — Telephone Encounter (Signed)
Left message for patient to call back and schedule Medicare Annual Wellness Visit (AWV) either virtually or in office.   Last AWV  No information  please schedule at anytime with [facility] Nurse Health Advisor 1 or 2   This should be a 45 minute visit. 

## 2020-11-05 NOTE — Telephone Encounter (Signed)
Pt called the office back and has been scheduled

## 2020-11-16 ENCOUNTER — Ambulatory Visit: Payer: PPO | Admitting: Internal Medicine

## 2020-11-26 ENCOUNTER — Ambulatory Visit: Payer: PPO

## 2020-12-03 ENCOUNTER — Encounter: Payer: Self-pay | Admitting: Internal Medicine

## 2020-12-03 ENCOUNTER — Ambulatory Visit: Payer: PPO | Admitting: Internal Medicine

## 2020-12-03 ENCOUNTER — Other Ambulatory Visit: Payer: Self-pay

## 2020-12-03 DIAGNOSIS — J449 Chronic obstructive pulmonary disease, unspecified: Secondary | ICD-10-CM | POA: Diagnosis not present

## 2020-12-03 MED ORDER — BREZTRI AEROSPHERE 160-9-4.8 MCG/ACT IN AERO: INHALATION_SPRAY | RESPIRATORY_TRACT | 11 refills | Status: DC

## 2020-12-03 MED ORDER — AZITHROMYCIN 250 MG PO TABS: ORAL_TABLET | ORAL | 0 refills | Status: DC

## 2020-12-03 MED ORDER — BREZTRI AEROSPHERE 160-9-4.8 MCG/ACT IN AERO: 2.0000 | INHALATION_SPRAY | Freq: Two times a day (BID) | RESPIRATORY_TRACT | 0 refills | Status: DC

## 2020-12-03 MED ORDER — PREDNISONE 10 MG PO TABS: ORAL_TABLET | ORAL | 0 refills | Status: DC

## 2020-12-03 NOTE — Progress Notes (Signed)
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 rec If not revealing will consider going ahead with a cpst - we will call to schedule Try prilosec 20mg   Take 30-60 min before first meal of the day and Pepcid 20 mg one bedtime until  We complete your work up GERD diet     12/29/2016  f/u ov/Marc Flores re:  Girtha Rm II copd/ worse sob with new dx of bronchiectasis  Chief Complaint  Patient presents with  . Pulmonary Consult    Referred by Dr. Johnsie Flores. Pt c/o DOE with exertion such as walking up hills and carrying out the garbage. He states he has had SOB for years, but this has been worse for 6 months. He also c/o prod cough with light green sputum.    all better / able to get up driveway fine p last pulmonary eval and eventually stopped the gerd rx got worse under [facility] f/u > pulmonary eval [facility] dx COPD rec inhalers > better transiently but worse x 6 m to point where can't get up same driveway without stopping half way - saba still helps some Mucus slt green x 6 m x 10 min each am ssoc with some pnds  Watery nasal discharge x years        01/26/2017  f/u ov/Marc Flores re:  GOLD II copd/ bronchiectasis on bevespi 2bid/ rare saba  On otc gerd rx  Chief Complaint  Patient presents with  . Follow-up    Breathing has improved some but not back  to baseline. His cough had resolved and then returned after finished abx- prod with beige sputum.   mailbox to house s stopping now due to sob  No am exacs and sleeps fine rec Bronchiectasis =   you have scarring of your bronchial tubes which means that they don't function perfectly normally and mucus tends to pool in certain areas of your lung which can cause pneumonia and further scarring of your lung and bronchial tubes Whenever you develop cough congestion take mucinex or mucinex dm  Up to 1200 mg every 12 hours as needed > these will help keep the mucus loose and flowing but if your condition worsens you need to seek help immediately preferably here or somewhere inside the [facility] system to compare xrays ( worse = darker or bloody mucus or pain on breathing in)   Plan A = Automatic =  Bevespi Take 2 puffs first thing in am and then another 2 puffs about 12 hours later.  Work on inhaler technique:   Plan B = Backup Only use your albuterol as a rescue medication       11/12/2017  f/u ov/Marc Flores re: copd gold II/ bronchiectasis back on  symbicort due to formulary restrictions  Chief Complaint  Patient presents with  . Follow-up    Breathing is unchanged. He is using his proair 2 x per wk on average.   Dyspnea:  House to mb a struggle x 100 ft steep incline  Cough: no problem Sleep: flat ok  SABA use:  Rare rec Change symbicort on a trial basis to trelegy one click each am       1/91/4782  f/u ov/Marc Flores re:  GOLD III / bronchiectasis maint rx = trelegy  Chief Complaint  Patient presents with  . Follow-up    Breathing is unchanged. He is using his albuterol inhaler 3 x per wk on average.   Dyspnea:  MMRC1 = can walk nl pace, flat grade, can't hurry or go uphills or steps s sob  / carry drill box or chain saw now / not convinced meds are helping vs baseline  Cough: none Sleeping: flat / 2 pillows  SABA use: proair 2-3 x per week 02: no  rec Stop trelegy about a week before it runs  out Only use your albuterol as a rescue medication to be used if you can't catch your breath    11/17/2019  f/u ov/Marc Flores re:  GOLD III/ bronchiectasis  maint rx trelegy Chief Complaint  Patient presents with  . Follow-up    Breathing has been worse for the past year- relates to when he wears a face covering. He is using his proair 4 x daily on average.    Dyspnea:  Mailbox and back about the same,  Cough: none  Sleeping: flat/ L side down/ 2 pillows  SABA use: as above, mostly with exertion  And way too much   02: none  rec Plan A = Automatic = Always=   Stop Trelegy and start Breztri  Take 2 puffs first thing in am and then another 2 puffs about 12 hours later.  Work on inhaler technique:   Plan B = Backup (to supplement plan A, not to replace it) Only use your albuterol (Proair)  inhaler as a rescue medication     12/03/2020  f/u ov/Marc Flores re:  GOLD III / trelegy/ mild flare copd  Chief Complaint  Patient presents with  . Follow-up    Congestion x 3 days. Productive cough with light green mucus x 3 days. Sob with exertion and wheezing have been worse during this time. Using albuterol 4-5 times/ day. Using the trelegy and that is working well.   Dyspnea:  mb and back has to stop half way to mb which is slt incline but not using saba then  Cough: turned green x 3 days Sleeping: flat / on side / 2 pillows  SABA use: up 4 x daily  02: none  Covid status:   vax x 3    No obvious day to day or daytime variability or assoc   mucus plugs or hemoptysis or cp or chest tightness, subjective wheeze or overt sinus or hb symptoms.   Sleeping  without nocturnal  or early am exacerbation  of respiratory  c/o's or need for noct saba. Also denies any obvious fluctuation of symptoms with weather or environmental changes or other aggravating or alleviating factors except as outlined above   No unusual exposure hx or h/o childhood pna/ asthma or knowledge of premature birth.  Current Allergies,  Complete Past Medical History, Past Surgical History, Family History, and Social History were reviewed in [employer] record.  ROS  The following are not active complaints unless bolded Hoarseness, sore throat, dysphagia, dental problems, itching, sneezing,  nasal congestion or discharge of excess mucus or purulent secretions, ear ache,   fever, chills, sweats, unintended wt loss or wt gain, classically pleuritic or exertional cp,  orthopnea pnd or arm/hand swelling  or leg swelling, presyncope, palpitations, abdominal pain, anorexia, nausea, vomiting, diarrhea  or change in bowel habits or change in bladder habits, change in stools or change in urine, dysuria, hematuria,  rash, arthralgias, visual complaints, headache, numbness, weakness or ataxia or problems with walking or coordination,  change in mood or  memory.        Current Meds  Medication Sig  . atorvastatin (LIPITOR) 10 MG tablet Take 10 mg by mouth daily.  . colchicine 0.6 MG tablet Take 2 tablets twice a day as needed for gout attacks  . Fluticasone-Umeclidin-Vilant (TRELEGY ELLIPTA) 100-62.5-25 MCG/INH AEPB Inhale 1 puff into the lungs daily. 1 PUFF IN AM  . PROAIR HFA 108 (90 Base) MCG/ACT inhaler Inhale 2 puffs into the lungs as directed.  . tamsulosin (FLOMAX) 0.4 MG CAPS capsule Take 1 capsule by mouth daily after supper.                              Objective:   Physical Exam   12/03/2020          178  11/17/2019        183 11/13/2018        186  .11/12/2017       178   01/26/2017       176  12/29/16 177 lb (80.3 kg)  12/20/16 176 lb 6.4 oz (80 kg)  12/08/11 179 lb (81.2 kg)    Vital signs reviewed  12/03/2020  - Note at rest 02 sats  96% on RA   General appearance:    amb wm nad     HEENT : pt wearing mask not removed for exam due to covid -19 concerns.    NECK :  without JVD/Nodes/TM/ nl carotid upstrokes bilaterally   LUNGS: no acc muscle use,  Mod barrel  contour chest wall with  bilateral  Distant bs s audible wheeze and  without cough on insp or exp maneuvers and mod  Hyperresonant  to  percussion bilaterally     CV:  RRR  no s3 or murmur or increase in P2, and no edema   ABD:  soft and nontender with pos mid insp Hoover's  in the supine position. No bruits or organomegaly appreciated, bowel sounds nl  MS:     ext warm without deformities, calf tenderness, cyanosis or clubbing No obvious joint restrictions   SKIN: warm and dry without lesions    NEURO:  alert, approp, nl sensorium with  no motor or cerebellar deficits apparent.               Assessment & Plan:

## 2020-12-03 NOTE — Assessment & Plan Note (Addendum)
Quit smoking 1992 - Spirometry 06/18/11 FEV1 1.27 (52%) ratio 40  - Spirometry 12/29/2016  FEV1 0.78 (34%)  Ratio 37  No saba first  - 12/29/2016  After extensive coaching HFA effectiveness =    50% from baseline of 50% rec trial of bevespi 2bid  - alpha one AT screen 12/29/16 >  MM - Allergy profile  12/29/16  >    IgE  16  RAST neg  - 01/26/2017  After extensive coaching HFA effectiveness =  50% from a baseline 25%    - 11/12/2017  After extensive coaching inhaler device  effectiveness =    90% with dpi > try trelegy if insurance covers - 11/13/2018 d/c trelegy trial basis as not sure it's helping > worse sob so restarted - 11/17/2019    try breztri 2bid > does remember  - 12/03/2020  After extensive coaching inhaler device,  effectiveness =    90% > rechallenge with Breztri   Group D in terms of symptom/risk and laba/lama/ICS  therefore appropriate rx at this point >>>  breztri plus approp saba plus zpak/ pred now for acute flare  Re saba: I spent extra time with pt today reviewing appropriate use of albuterol for prn use on exertion with the following points: 1) saba is for relief of sob that does not improve by walking a slower pace or resting but rather if the pt does not improve after trying this first. 2) If the pt is convinced, as many are, that saba helps recover from activity faster then it's easy to tell if this is the case by re-challenging : ie stop, take the inhaler, then p 5 minutes try the exact same activity (intensity of workload) that just caused the symptoms and see if they are substantially diminished or not after saba 3) if there is an activity that reproducibly causes the symptoms, try the saba 15 min before the activity on alternate days   If in fact the saba really does help, then fine to continue to use it prn but advised may need to look closer at the maintenance regimen being used to achieve better control of airways disease with exertion.    F/u yearly, call sooner if  needed          Each maintenance medication was reviewed in detail including emphasizing most importantly the difference between maintenance and prns and under what circumstances the prns are to be triggered using an action plan format where appropriate.  Total time for H and P, chart review, counseling,   and generating customized AVS unique to this office visit / same day charting = 24 min

## 2020-12-03 NOTE — Patient Instructions (Addendum)
Plan A = Automatic = Always=    Breztri Take 2 puffs first thing in am and then another 2 puffs about 12 hours later.   Work on inhaler technique:  relax and gently blow all the way out then take a nice smooth deep breath back in, triggering the inhaler at same time you start breathing in.  Hold for up to 5 seconds if you can. Blow out thru nose. Rinse and gargle with water when done   Plan B = Backup (to supplement plan A, not to replace it) Only use your albuterol inhaler as a rescue medication to be used if you can't catch your breath by resting or doing a relaxed purse lip breathing pattern.  - The less you use it, the better it will work when you need it. - Ok to use the inhaler up to 2 puffs  every 4 hours if you must but call for appointment if use goes up over your usual need - Don't leave home without it !!  (think of it like the spare tire for your car)     Try albuterol 15 min before an activity that you know would make you short of breath and see if it makes any difference and if makes none then don't take it after activity unless you can't catch your breath.   Please schedule a follow up visit in 12  months but call sooner if needed

## 2020-12-15 NOTE — Progress Notes (Signed)
Subjective:   Marc Flores is a 80 y.o. male who presents for Medicare Annual/Subsequent preventive examination.  I connected with Fonnie Mu by telephone and verified that I am speaking with the correct person using two identifiers. Location patient: home Location provider: work Persons participating in the virtual visit: patient, provider.   I discussed the limitations, risks, security and privacy concerns of performing an evaluation and management service by telephone and the availability of in person appointments. I also discussed with the patient that there may be a patient responsible charge related to this service. The patient expressed understanding and verbally consented to this telephonic visit.    Interactive audio and video telecommunications were attempted between this provider and patient, however failed, due to patient having technical difficulties OR patient did not have access to video capability.  We continued and completed visit with audio only.  I connected with Marc Flores today by telephone and verified that I am speaking with the correct person using two identifiers. Location patient: home Location provider: work Persons participating in the virtual visit: patient, provider.   I discussed the limitations, risks, security and privacy concerns of performing an evaluation and management service by telephone and the availability of in person appointments. I also discussed with the patient that there may be a patient responsible charge related to this service. The patient expressed understanding and verbally consented to this telephonic visit.    Interactive audio and video telecommunications were attempted between this provider and patient, however failed, due to patient having technical difficulties OR patient did not have access to video capability.  We continued and completed visit with audio only.      Review of Systems    n/a Cardiac Risk  Factors include: advanced age (>29men, >46 women);hypertension;dyslipidemia;male gender     Objective:    Today's Vitals   There is no height or weight on file to calculate BMI.  Advanced Directives 12/16/2020 12/18/2019  Does Patient Have a Medical Advance Directive? Yes No  Type of Advance Directive Living will;Healthcare Power of Attorney -  Does patient want to make changes to medical advance directive? No - Patient declined -  Copy of [place] in Chart? No - copy requested -  Would patient like information on creating a medical advance directive? - No - Patient declined    Current Medications (verified) Outpatient Encounter Medications as of 12/16/2020  Medication Sig  . atorvastatin (LIPITOR) 10 MG tablet Take 10 mg by mouth daily.  . Budeson-Glycopyrrol-Formoterol (BREZTRI AEROSPHERE) 160-9-4.8 MCG/ACT AERO Take 2 puffs first thing in am and then another 2 puffs about 12 hours later.  . colchicine 0.6 MG tablet Take 2 tablets twice a day as needed for gout attacks  . PROAIR HFA 108 (90 Base) MCG/ACT inhaler Inhale 2 puffs into the lungs as directed.  . tamsulosin (FLOMAX) 0.4 MG CAPS capsule Take 1 capsule by mouth daily after supper.   . [DISCONTINUED] azithromycin (ZITHROMAX) 250 MG tablet Take 2 on day one then 1 daily x 4 days  . [DISCONTINUED] predniSONE (DELTASONE) 10 MG tablet Take  4 each am x 2 days,   2 each am x 2 days,  1 each am x 2 days and stop   Facility-Administered Encounter Medications as of 12/16/2020  Medication  . 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   History: Past Medical History:  Diagnosis Date  . Arthritis   .  Bruises easily   . 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 Diona Fanti  . COPD (chronic obstructive pulmonary disease) ([place])    sees Dr. Melvyn Novas    . Diverticulitis   . Dyspnea    ON EXERTION  . History of chickenpox   . Hyperlipidemia     Past Surgical History:  Procedure Laterality Date  . COLONOSCOPY  04/13/2020   per Dr. Carol Ada, adenomaotus polyps, repeat in 7 yrs   . CYSTOSCOPY  11/27/2019   IN [facility]  . CYSTOSCOPY W/ RETROGRADES Bilateral 12/18/2019   Procedure: CYSTOSCOPY WITH RETROGRADE PYELOGRAM;  Surgeon: Franchot Gallo, MD;  Location: [facility];  Service: Urology;  Laterality: Bilateral;  . TONSILLECTOMY  AS CHILD  . TRANSURETHRAL RESECTION OF BLADDER TUMOR WITH MITOMYCIN-C N/A 12/18/2019   Procedure: TRANSURETHRAL RESECTION OF BLADDER TUMOR, RIGHT URETEROSCOPY AND RIGHT URETERAL STENT PLACEMENT;  Surgeon: Franchot Gallo, MD;  Location: [facility];  Service: Urology;  Laterality: N/A;  . WISDOM TOOTH EXTRACTION  AS CHILD   Family History  Problem Relation Age of Onset  . Glaucoma Mother   . Diabetes Mother   . Hearing loss Mother   . Heart disease Mother   . Heart disease Father   . Atrial fibrillation Father   . Arthritis Father   . Cancer Father   . Hearing loss Father   . Hypertension Father    Social History   Socioeconomic History  . Marital status: Married    Spouse name: Not on file  . Number of children: 2  . Years of education: Not on file  . Highest education level: Not on file  Occupational History  . Occupation: security  Tobacco Use  . Smoking status: Former Smoker    Packs/day: 1.00    Years: 30.00    Pack years: 30.00    Types: Cigarettes    Quit date: 12/08/1991    Years since quitting: 29.0  . Smokeless tobacco: Never Used  Vaping Use  . Vaping Use: Never used  Substance and Sexual Activity  . Alcohol use: Yes  . Drug use: No  . Sexual activity: Yes  Other Topics Concern  . Not on file  Social History Narrative  . Not on file   Social Determinants of Health   Financial Resource Strain: Low Risk   . Difficulty of Paying Living Expenses: Not hard at all  Food Insecurity: No Food Insecurity  . Worried About  Charity fundraiser in the Last Year: Never true  . Ran Out of Food in the Last Year: Never true  Transportation Needs: No Transportation Needs  . Lack of Transportation (Medical): No  . Lack of Transportation (Non-Medical): No  Physical Activity: Sufficiently Active  . Days of Exercise per Week: 7 days  . Minutes of Exercise per Session: 60 min  Stress: No Stress Concern Present  . Feeling of Stress : Not at all  Social Connections: Moderately Integrated  . Frequency of Communication with Friends and Family: More than three times a week  . Frequency of Social Gatherings with Friends and Family: More than three times a week  . Attends Religious Services: 1 to 4 times per year  . Active Member of Clubs or Organizations: No  . Attends Archivist Meetings: Never  . Marital Status: Married    Tobacco Counseling Counseling given: Not Answered   Clinical Intake:  Pre-visit preparation completed: Yes  Pain : No/denies pain  Nutritional Risks: None Diabetes: No  How often do you need to have someone help you when you read instructions, pamphlets, or other written materials from your doctor or pharmacy?: 1 - Never What is the last grade level you completed in school?: [place]  Interpreter Needed?: No  Information entered by :: [place] of Daily Living In your present state of health, do you have any difficulty performing the following activities: 12/16/2020 12/18/2019  Hearing? N N  Vision? N N  Difficulty concentrating or making decisions? N N  Walking or climbing stairs? N N  Dressing or bathing? N N  Doing errands, shopping? N -  Preparing Food and eating ? N -  Using the Toilet? N -  In the past six months, have you accidently leaked urine? N -  Do you have problems with loss of bowel control? N -  Managing your Medications? N -  Managing your Finances? N -  Housekeeping or managing your Housekeeping? N -  Some recent  data might be hidden    Patient Care Team: Laurey Morale, MD as PCP - General (Family Medicine)  Indicate any recent Medical Services you may have received from other than [facility] providers in the past year (date may be approximate).     Assessment:   This is a routine wellness examination for [place].  Hearing/Vision screen  Hearing Screening   125Hz  250Hz  500Hz  1000Hz  2000Hz  3000Hz  4000Hz  6000Hz  8000Hz   Right ear:           Left ear:           Vision Screening Comments: Annual eye exams wears glasses   Dietary issues and exercise activities discussed: Current Exercise Habits: Home exercise routine, Type of exercise: walking, Time (Minutes): 60, Frequency (Times/Week): 7, Weekly Exercise (Minutes/Week): 420, Intensity: Mild, Exercise limited by: None identified  Goals    . DIET - INCREASE WATER INTAKE      Depression Screen PHQ 2/9 Scores 12/16/2020 12/16/2020  PHQ - 2 Score 0 0    Fall Risk Fall Risk  12/16/2020  Falls in the past year? 0  Number falls in past yr: 0  Injury with Fall? 0  Follow up Falls evaluation completed    FALL RISK PREVENTION PERTAINING TO THE HOME:  Any stairs in or around the home? No  If so, are there any without handrails? Yes  Home free of loose throw rugs in walkways, pet beds, electrical cords, etc? Yes  Adequate lighting in your home to reduce risk of falls? Yes   ASSISTIVE DEVICES UTILIZED TO PREVENT FALLS:  Life alert? No  Use of a cane, walker or w/c? Yes  Grab bars in the bathroom? Yes  Shower chair or bench in shower? Yes  Elevated toilet seat or a handicapped toilet? Yes    Cognitive Function:     Normal cognitive status assessed by direct observation by this Nurse Health Advisor. No abnormalities found.      Immunizations Immunization History  Administered Date(s) Administered  . PFIZER(Purple Top)SARS-COV-2 Vaccination 10/10/2019, 10/31/2019, 06/30/2020  . Pneumococcal Polysaccharide-23 12/08/2006, 09/04/2014  . Tdap  09/04/2014    TDAP status: Up to date  Flu Vaccine status: Up to date  Pneumococcal vaccine status: Up to date  Covid-19 vaccine status: Completed vaccines  Qualifies for Shingles Vaccine? Yes   Zostavax completed No   Shingrix Completed?: No.    Education has been provided regarding the importance of this vaccine. Patient has been  advised to call insurance company to determine out of pocket expense if they have not yet received this vaccine. Advised may also receive vaccine at local pharmacy or Health Dept. Verbalized acceptance and understanding.  Screening Tests Health Maintenance  Topic Date Due  . Hepatitis C Screening  05/11/2021 (Originally 07/30/1941)  . INFLUENZA VACCINE  04/04/2021  . TETANUS/TDAP  04/21/2029  . COVID-19 Vaccine  Completed  . PNA vac Low Risk Adult  Completed  . HPV VACCINES  Aged Out    Health Maintenance  There are no preventive care reminders to display for this patient.  Colorectal cancer screening: Type of screening: Colonoscopy. Completed 04/13/2020. Repeat every 7 years  Lung Cancer Screening: (Low Dose CT Chest recommended if Age 33-80 years, 30 pack-year currently smoking OR have quit w/in 15years.) does not qualify.   Lung Cancer Screening Referral: n/a   Additional Screening:  Hepatitis C Screening: does not qualify  Vision Screening: Recommended annual ophthalmology exams for early detection of glaucoma and other disorders of the eye. Is the patient up to date with their annual eye exam?  Yes  Who is the provider or what is the name of the office in which the patient attends annual eye exams? Dr. Valetta Close  If pt is not established with a provider, would they like to be referred to a provider to establish care? No .   Dental Screening: Recommended annual dental exams for proper oral hygiene  Community Resource Referral / Chronic Care Management: CRR required this visit?  No   CCM required this visit?  No      Plan:     I have  personally reviewed and noted the following in the patient's chart:   . Medical and social history . Use of alcohol, tobacco or illicit drugs  . Current medications and supplements . Functional ability and status . Nutritional status . Physical activity . Advanced directives . List of other physicians . Hospitalizations, surgeries, and ER visits in previous 12 months . Vitals . Screenings to include cognitive, depression, and falls . Referrals and appointments  In addition, I have reviewed and discussed with patient certain preventive protocols, quality metrics, and best practice recommendations. A written personalized care plan for preventive services as well as general preventive health recommendations were provided to patient.     Randel Pigg, LPN   01/12/2584   Nurse Notes: none

## 2020-12-16 ENCOUNTER — Ambulatory Visit (INDEPENDENT_AMBULATORY_CARE_PROVIDER_SITE_OTHER): Payer: PPO

## 2020-12-16 DIAGNOSIS — Z0000 Encounter for general adult medical examination without abnormal findings: Secondary | ICD-10-CM | POA: Diagnosis not present

## 2020-12-16 NOTE — Patient Instructions (Addendum)
Marc Flores , Thank you for taking time to come for your Medicare Wellness Visit. I appreciate your ongoing commitment to your health goals. Please review the following plan we discussed and let me know if I can assist you in the future.   Screening recommendations/referrals: Colonoscopy: current 04/14/2027 Recommended yearly ophthalmology/optometry visit for glaucoma screening and checkup Recommended yearly dental visit for hygiene and checkup  Vaccinations: Influenza vaccine: declines Pneumococcal vaccine: completed series Tdap vaccine: current Due 09/04/2024 Shingles vaccine: declines     Advanced directives: Will provide copies   Conditions/risks identified: none   Next appointment: 05/12/2021@830am   Dr. Sarajane Flores   Preventive Care 39 Years and Older, Male Preventive care refers to lifestyle choices and visits with your health care provider that can promote health and wellness. What does preventive care include?  A yearly physical exam. This is also called an annual well check.  Dental exams once or twice a year.  Routine eye exams. Ask your health care provider how often you should have your eyes checked.  Personal lifestyle choices, including:  Daily care of your teeth and gums.  Regular physical activity.  Eating a healthy diet.  Avoiding tobacco and drug use.  Limiting alcohol use.  Practicing safe sex.  Taking low doses of aspirin every day.  Taking vitamin and mineral supplements as recommended by your health care provider. What happens during an annual well check? The services and screenings done by your health care provider during your annual well check will depend on your age, overall health, lifestyle risk factors, and family history of disease. Counseling  Your health care provider may ask you questions about your:  Alcohol use.  Tobacco use.  Drug use.  Emotional well-being.  Home and relationship well-being.  Sexual activity.  Eating  habits.  History of falls.  Memory and ability to understand (cognition).  Work and work Statistician. Screening  You may have the following tests or measurements:  Height, weight, and BMI.  Blood pressure.  Lipid and cholesterol levels. These may be checked every 5 years, or more frequently if you are over 36 years old.  Skin check.  Lung cancer screening. You may have this screening every year starting at age 47 if you have a 30-pack-year history of smoking and currently smoke or have quit within the past 15 years.  Fecal occult blood test (FOBT) of the stool. You may have this test every year starting at age 66.  Flexible sigmoidoscopy or colonoscopy. You may have a sigmoidoscopy every 5 years or a colonoscopy every 10 years starting at age 75.  Prostate cancer screening. Recommendations will vary depending on your family history and other risks.  Hepatitis C blood test.  Hepatitis B blood test.  Sexually transmitted disease (STD) testing.  Diabetes screening. This is done by checking your blood sugar (glucose) after you have not eaten for a while (fasting). You may have this done every 1-3 years.  Abdominal aortic aneurysm (AAA) screening. You may need this if you are a current or former smoker.  Osteoporosis. You may be screened starting at age 78 if you are at high risk. Talk with your health care provider about your test results, treatment options, and if necessary, the need for more tests. Vaccines  Your health care provider may recommend certain vaccines, such as:  Influenza vaccine. This is recommended every year.  Tetanus, diphtheria, and acellular pertussis (Tdap, Td) vaccine. You may need a Td booster every 10 years.  Zoster vaccine. You may  need this after age 33.  Pneumococcal 13-valent conjugate (PCV13) vaccine. One dose is recommended after age 61.  Pneumococcal polysaccharide (PPSV23) vaccine. One dose is recommended after age 67. Talk to your health  care provider about which screenings and vaccines you need and how often you need them. This information is not intended to replace advice given to you by your health care provider. Make sure you discuss any questions you have with your health care provider. Document Released: 09/17/2015 Document Revised: 05/10/2016 Document Reviewed: 06/22/2015 Elsevier Interactive Patient Education  2017 Marc [name] Falls can cause injuries. They can happen to people of all ages. There are many things you can do to make your home safe and to help prevent falls. What can I do on the outside of my home?  Regularly fix the edges of walkways and driveways and fix any cracks.  Remove anything that might make you trip as you walk through a door, such as a raised step or threshold.  Trim any bushes or trees on the path to your home.  Use bright outdoor lighting.  Clear any walking paths of anything that might make someone trip, such as rocks or tools.  Regularly check to see if handrails are loose or broken. Make sure that both sides of any steps have handrails.  Any raised decks and porches should have guardrails on the edges.  Have any leaves, snow, or ice cleared regularly.  Use sand or salt on walking paths during winter.  Clean up any spills in your garage right away. This includes oil or grease spills. What can I do in the bathroom?  Use night lights.  Install grab bars by the toilet and in the tub and shower. Do not use towel bars as grab bars.  Use non-skid mats or decals in the tub or shower.  If you need to sit down in the shower, use a plastic, non-slip stool.  Keep the floor dry. Clean up any water that spills on the floor as soon as it happens.  Remove soap buildup in the tub or shower regularly.  Attach bath mats securely with double-sided non-slip rug tape.  Do not have throw rugs and other things on the floor that can make you trip. What can I do  in the bedroom?  Use night lights.  Make sure that you have a light by your bed that is easy to reach.  Do not use any sheets or blankets that are too big for your bed. They should not hang down onto the floor.  Have a firm chair that has side arms. You can use this for support while you get dressed.  Do not have throw rugs and other things on the floor that can make you trip. What can I do in the kitchen?  Clean up any spills right away.  Avoid walking on wet floors.  Keep items that you use a lot in easy-to-reach places.  If you need to reach something above you, use a strong step stool that has a grab bar.  Keep electrical cords out of the way.  Do not use floor polish or wax that makes floors slippery. If you must use wax, use non-skid floor wax.  Do not have throw rugs and other things on the floor that can make you trip. What can I do with my stairs?  Do not leave any items on the stairs.  Make sure that there are handrails on both sides  of the stairs and use them. Fix handrails that are broken or loose. Make sure that handrails are as long as the stairways.  Check any carpeting to make sure that it is firmly attached to the stairs. Fix any carpet that is loose or worn.  Avoid having throw rugs at the top or bottom of the stairs. If you do have throw rugs, attach them to the floor with carpet tape.  Make sure that you have a light switch at the top of the stairs and the bottom of the stairs. If you do not have them, ask someone to add them for you. What else can I do to help prevent falls?  Wear shoes that:  Do not have high heels.  Have rubber bottoms.  Are comfortable and fit you well.  Are closed at the toe. Do not wear sandals.  If you use a stepladder:  Make sure that it is fully opened. Do not climb a closed stepladder.  Make sure that both sides of the stepladder are locked into place.  Ask someone to hold it for you, if possible.  Clearly mark  and make sure that you can see:  Any grab bars or handrails.  First and last steps.  Where the edge of each step is.  Use tools that help you move around (mobility aids) if they are needed. These include:  Canes.  Walkers.  Scooters.  Crutches.  Turn on the lights when you go into a dark area. Replace any light bulbs as soon as they burn out.  Set up your furniture so you have a clear path. Avoid moving your furniture around.  If any of your floors are uneven, fix them.  If there are any pets around you, be aware of where they are.  Review your medicines with your doctor. Some medicines can make you feel dizzy. This can increase your chance of falling. Ask your doctor what other things that you can do to help prevent falls. This information is not intended to replace advice given to you by your health care provider. Make sure you discuss any questions you have with your health care provider. Document Released: 06/17/2009 Document Revised: 01/27/2016 Document Reviewed: 09/25/2014 Elsevier Interactive Patient Education  2017 Reynolds Flores.

## 2020-12-28 ENCOUNTER — Other Ambulatory Visit: Payer: Self-pay | Admitting: Family Medicine

## 2020-12-28 NOTE — Telephone Encounter (Signed)
Last labs- 2018 Last office visit- 09/28/2020   CPE- 09/28/2020

## 2021-01-17 ENCOUNTER — Telehealth: Payer: Self-pay | Admitting: Family Medicine

## 2021-01-17 NOTE — Progress Notes (Signed)
  Chronic Care Management   Note  01/17/2021 Name: Marc Flores MRN: [id number] DOB: 1940-10-09  Marc Flores is a 80 y.o. year old male who is a primary care patient of Laurey Morale, MD. I reached out to [place] by phone today in response to a referral sent by Marc Flores's PCP, Laurey Morale, MD.   [name] was given information about Chronic Care Management services today including:  1. CCM service includes personalized support from designated clinical staff supervised by his physician, including individualized plan of care and coordination with other care providers 2. 24/7 contact phone numbers for assistance for urgent and routine care needs. 3. Service will only be billed when office clinical staff spend 20 minutes or more in a month to coordinate care. 4. Only one practitioner may furnish and bill the service in a calendar month. 5. The patient may stop CCM services at any time (effective at the end of the month) by phone call to the office staff.   Patient agreed to services and verbal consent obtained.   Follow up plan:   Carley Perdue [employer] Scheduler

## 2021-02-15 ENCOUNTER — Telehealth: Payer: Self-pay | Admitting: Pharmacist

## 2021-02-15 NOTE — Chronic Care Management (AMB) (Signed)
Chronic Care Management Pharmacy Assistant   Name: Marc Flores  MRN: [id number] DOB: 1940/12/11  Marc Flores is an 80 y.o. year old male who presents for his initial CCM visit with the clinical pharmacist.  Reason for Encounter: Chart Prep for initial visit with Marc Flores the clinical pharmacist on 02/18/21.    Conditions to be addressed/monitored: COPD and Gout and Dyslipidemia.   Primary concerns for visit include: Gout and COPD.  Recent office visits:  09/28/20 Marc Flores (PCP) -  video visit for chronic gout. Changed colchicine 0.6mg  to 2 tablets twice a day as needed instead of 1 tablet daily as needed.   05/11/20 Marc Flores (PCP) - seen for dyslipidemia and other conditions. Discontinued azithromycin, methylprednisolone, oxybutynin chloride and oxycodone. No follow up noted.   Recent consult visits:  12/03/20 Marc Flores (Pulmonary Disease) - presented to clinic for follow up on COPD GOLD III. Patient started on Azithromycin 250mg  taper pak and Budesonide- Glycopyrrol- Formotol 160-9-4.8 mcg/act 2 puffs first thing in the am and then another 2 puffs 12 hours later. Started on prednisone 10mg  taper pak. Discontinued fluticasone-umeclidin-vilant 100-62.5-25mcg/inh. Follow up yearly or sooner when needed.   11/02/20 Marc Flores (Ophthalmology) - seen for follow up for vitreous degeneration of right eye. No medication changes or follow up noted.   09/30/20 Marc Flores (Ophthalmology) - seen for follow up for vitreous degeneration of right eye. No medication changes or follow up noted.   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 No medication changes or follow up noted.   Hospital visits:  None in previous 6 months  Fill History:  PROAIR HFA ORAL INH (200  PFS) 8.5G 11/17/2019 15   ATORVASTATIN 10MG  TABLETS 12/28/2020 90   BREZTRI AERO SPHERE  INHALER 120 INH 02/13/2021 30   COLCHICINE 0.6MG  TABLETS 09/28/2020 15   TAMSULOSIN 0.4MG  CAPSULES 01/25/2021 30    Medications: Outpatient Encounter Medications as of 02/15/2021  Medication Sig   atorvastatin (LIPITOR) 10 MG tablet TAKE 1 TABLET BY MOUTH EVERY DAY   Budeson-Glycopyrrol-Formoterol (BREZTRI AEROSPHERE) 160-9-4.8 MCG/ACT AERO Take 2 puffs first thing in am and then another 2 puffs about 12 hours later.   colchicine 0.6 MG tablet Take 2 tablets twice a day as needed for gout attacks   PROAIR HFA 108 (90 Base) MCG/ACT inhaler Inhale 2 puffs into the lungs as directed.   tamsulosin (FLOMAX) 0.4 MG CAPS capsule Take 1 capsule by mouth daily after supper.    Facility-Administered Encounter Medications as of 02/15/2021  Medication   gemcitabine (GEMZAR) chemo syringe for bladder instillation 2,000 mg    Have you seen any other providers since your last visit?  Yes. Per patients wife Marc Flores he has seen his pulmonologist and his trellegy was changed to [place]. Any changes in your medications or health?  Yes. Pulmonologist changed trellegy inhaler for his COPD to [facility].  Any side effects from any medications?  No issues at this time.  Do you have an symptoms or problems not managed by your medications?  No. Per regina patients wife he has a hard time accepting that his inhalers are not a cure for his COPD. Otherwise, he feels his inhalers and his medication are helping manage his conditions.  Any concerns about your health right now?  No. Has your provider asked that you check blood pressure, blood sugar, or follow special diet at  home?  No patient has perfect blood pressure and no issues with his sugars per Marc Flores.  Do you get any type of exercise on a regular basis?  Yes. 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 Patient is constantly doing something per Marc Flores.  Can you think of a goal you would like to reach for your health?  Marc Flores could  not think of anything that patient has told her. She just states that patients wishes he could breathe better on his own at times without the help of medication.  Do you have any problems getting your medications? No issues at this time.  Is there anything that you would like to discuss during the appointment? Marc Flores could not think of anything except maybe discussing his gout as far as is there something he can take regularly to prevent flares instead of taking colchicine once it does flare?  Spoke with Marc Flores patients wife who also has new patient appointment on same day with Marc Flores due to patient not being home. Please bring medications and supplements to appointment.  Star Rating Drugs:  Atorvastatin 10mg  - last filled on 12/28/20 90DS at [facility].   [place]  Clinical Pharmacist Assistant [phone]

## 2021-02-18 ENCOUNTER — Ambulatory Visit: Payer: PPO

## 2021-04-29 DIAGNOSIS — D414 Neoplasm of uncertain behavior of bladder: Secondary | ICD-10-CM | POA: Diagnosis not present

## 2021-04-29 DIAGNOSIS — N130 Hydronephrosis with ureteropelvic junction obstruction: Secondary | ICD-10-CM | POA: Diagnosis not present

## 2021-05-12 ENCOUNTER — Ambulatory Visit (INDEPENDENT_AMBULATORY_CARE_PROVIDER_SITE_OTHER): Payer: PPO | Admitting: Family Medicine

## 2021-05-12 ENCOUNTER — Encounter: Payer: Self-pay | Admitting: Family Medicine

## 2021-05-12 ENCOUNTER — Other Ambulatory Visit: Payer: Self-pay

## 2021-05-12 VITALS — BP 118/78 | HR 70 | Temp 98.2°F | Ht 64.0 in | Wt 172.0 lb

## 2021-05-12 DIAGNOSIS — Z0000 Encounter for general adult medical examination without abnormal findings: Secondary | ICD-10-CM | POA: Diagnosis not present

## 2021-05-12 DIAGNOSIS — M1A9XX0 Chronic gout, unspecified, without tophus (tophi): Secondary | ICD-10-CM

## 2021-05-12 LAB — LIPID PANEL
Cholesterol: 145 mg/dL (ref 0–200)
HDL: 45.8 mg/dL (ref >39.00)
LDL Cholesterol: 81 mg/dL (ref 0–99)
NonHDL: 98.72
Total CHOL/HDL Ratio: 3
Triglycerides: 89 mg/dL (ref 0.0–149.0)
VLDL: 17.8 mg/dL (ref 0.0–40.0)

## 2021-05-12 LAB — HEPATIC FUNCTION PANEL
ALT: 19 U/L (ref 0–53)
AST: 18 U/L (ref 0–37)
Albumin: 4.4 g/dL (ref 3.5–5.2)
Alkaline Phosphatase: 93 U/L (ref 39–117)
Bilirubin, Direct: 0.2 mg/dL (ref 0.0–0.3)
Total Bilirubin: 1.2 mg/dL (ref 0.2–1.2)
Total Protein: 7 g/dL (ref 6.0–8.3)

## 2021-05-12 LAB — CBC WITH DIFFERENTIAL/PLATELET
Basophils Absolute: 0.1 10*3/uL (ref 0.0–0.1)
Basophils Relative: 1.2 % (ref 0.0–3.0)
Eosinophils Absolute: 0.2 10*3/uL (ref 0.0–0.7)
Eosinophils Relative: 4.6 % (ref 0.0–5.0)
HCT: 51.4 % (ref 39.0–52.0)
Hemoglobin: 17.2 g/dL — ABNORMAL HIGH (ref 13.0–17.0)
Lymphocytes Relative: 27.8 % (ref 12.0–46.0)
Lymphs Abs: 1.5 10*3/uL (ref 0.7–4.0)
MCHC: 33.4 g/dL (ref 30.0–36.0)
MCV: 90.7 fl (ref 78.0–100.0)
Monocytes Absolute: 0.7 10*3/uL (ref 0.1–1.0)
Monocytes Relative: 13.5 % — ABNORMAL HIGH (ref 3.0–12.0)
Neutro Abs: 2.9 10*3/uL (ref 1.4–7.7)
Neutrophils Relative %: 52.9 % (ref 43.0–77.0)
Platelets: 247 10*3/uL (ref 150.0–400.0)
RBC: 5.67 Mil/uL (ref 4.22–5.81)
RDW: 13.7 % (ref 11.5–15.5)
WBC: 5.4 10*3/uL (ref 4.0–10.5)

## 2021-05-12 LAB — TSH: TSH: 4.48 u[IU]/mL (ref 0.35–5.50)

## 2021-05-12 LAB — BASIC METABOLIC PANEL
BUN: 20 mg/dL (ref 6–23)
CO2: 26 mEq/L (ref 19–32)
Calcium: 9.5 mg/dL (ref 8.4–10.5)
Chloride: 104 mEq/L (ref 96–112)
Creatinine, Ser: 1.55 mg/dL — ABNORMAL HIGH (ref 0.40–1.50)
GFR: 42.6 mL/min — ABNORMAL LOW (ref >60.00)
Glucose, Bld: 82 mg/dL (ref 70–99)
Potassium: 4.3 mEq/L (ref 3.5–5.1)
Sodium: 139 mEq/L (ref 135–145)

## 2021-05-12 LAB — URIC ACID: Uric Acid, Serum: 8.8 mg/dL — ABNORMAL HIGH (ref 4.0–7.8)

## 2021-05-12 LAB — HEMOGLOBIN A1C: Hgb A1c MFr Bld: 5.5 % (ref 4.6–6.5)

## 2021-05-12 NOTE — Progress Notes (Signed)
   Subjective:    Patient ID: Marc Flores, male    DOB: Sep 11, 1940, 80 y.o.   MRN: [id number]  HPI Here for a well exam. He feels great. His gout has been well controlled. He sees Dr. Melvyn Novas for the COPD and he sees Dr. Diona Fanti for urologic exams.    Review of Systems  Constitutional: Negative.   HENT: Negative.    Eyes: Negative.   Respiratory: Negative.    Cardiovascular: Negative.   Gastrointestinal: Negative.   Genitourinary: Negative.   Musculoskeletal: Negative.   Skin: Negative.   Neurological: Negative.   Psychiatric/Behavioral: Negative.        Objective:   Physical Exam Constitutional:      General: He is not in acute distress.    Appearance: Normal appearance. He is well-developed. He is not diaphoretic.  HENT:     Head: Normocephalic and atraumatic.     Right Ear: External ear normal.     Left Ear: External ear normal.     Nose: Nose normal.     Mouth/Throat:     Pharynx: No oropharyngeal exudate.  Eyes:     General: No scleral icterus.       Right eye: No discharge.        Left eye: No discharge.     Conjunctiva/sclera: Conjunctivae normal.     Pupils: Pupils are equal, round, and reactive to light.  Neck:     Thyroid: No thyromegaly.     Vascular: No JVD.     Trachea: No tracheal deviation.  Cardiovascular:     Rate and Rhythm: Normal rate and regular rhythm.     Heart sounds: Normal heart sounds. No murmur heard.   No friction rub. No gallop.  Pulmonary:     Effort: Pulmonary effort is normal. No respiratory distress.     Breath sounds: Normal breath sounds. No wheezing or rales.  Chest:     Chest wall: No tenderness.  Abdominal:     General: Bowel sounds are normal. There is no distension.     Palpations: Abdomen is soft. There is no mass.     Tenderness: There is no abdominal tenderness. There is no guarding or rebound.  Genitourinary:    Penis: No tenderness.   Musculoskeletal:        General: No tenderness. Normal range of motion.      Cervical back: Neck supple.  Lymphadenopathy:     Cervical: No cervical adenopathy.  Skin:    General: Skin is warm and dry.     Coloration: Skin is not pale.     Findings: No erythema or rash.  Neurological:     Mental Status: He is alert and oriented to person, place, and time.     Cranial Nerves: No cranial nerve deficit.     Motor: No abnormal muscle tone.     Coordination: Coordination normal.     Deep Tendon Reflexes: Reflexes are normal and symmetric. Reflexes normal.  Psychiatric:        Behavior: Behavior normal.        Thought Content: Thought content normal.        Judgment: Judgment normal.          Assessment & Plan:  Well exam. We discussed diet and exercise. Get fasting labs.  Alysia Penna, MD

## 2021-05-18 ENCOUNTER — Telehealth: Payer: Self-pay | Admitting: Internal Medicine

## 2021-05-18 MED ORDER — BREZTRI AEROSPHERE 160-9-4.8 MCG/ACT IN AERO: 2.0000 | INHALATION_SPRAY | Freq: Two times a day (BID) | RESPIRATORY_TRACT | 0 refills | Status: DC

## 2021-05-18 NOTE — Telephone Encounter (Signed)
Called and spoke with patient regarding Breztri samples. He states that he is in the donut hole and the inhaler is costing over $100 a month. I advised him that since he is in the donut hole no matter what we give him will be expensive. I also advised him that we couldn't give out a 3 month supply of samples. Patient asked if he could have a months supply.  Samples up front for patient pick up.  Nothing further needed at this time.

## 2021-06-10 ENCOUNTER — Ambulatory Visit (INDEPENDENT_AMBULATORY_CARE_PROVIDER_SITE_OTHER): Payer: PPO

## 2021-06-10 ENCOUNTER — Encounter (HOSPITAL_COMMUNITY): Payer: Self-pay | Admitting: Emergency Medicine

## 2021-06-10 ENCOUNTER — Other Ambulatory Visit: Payer: Self-pay

## 2021-06-10 ENCOUNTER — Ambulatory Visit (HOSPITAL_COMMUNITY)
Admission: EM | Admit: 2021-06-10 | Discharge: 2021-06-10 | Disposition: A | Discharge status: Home / Self Care | Payer: PPO | Attending: Student | Admitting: Student

## 2021-06-10 DIAGNOSIS — J449 Chronic obstructive pulmonary disease, unspecified: Secondary | ICD-10-CM | POA: Diagnosis not present

## 2021-06-10 DIAGNOSIS — R059 Cough, unspecified: Secondary | ICD-10-CM

## 2021-06-10 DIAGNOSIS — J441 Chronic obstructive pulmonary disease with (acute) exacerbation: Secondary | ICD-10-CM

## 2021-06-10 DIAGNOSIS — R0600 Dyspnea, unspecified: Secondary | ICD-10-CM | POA: Diagnosis not present

## 2021-06-10 DIAGNOSIS — J069 Acute upper respiratory infection, unspecified: Secondary | ICD-10-CM

## 2021-06-10 DIAGNOSIS — J439 Emphysema, unspecified: Secondary | ICD-10-CM | POA: Diagnosis not present

## 2021-06-10 MED ORDER — PREDNISONE 10 MG (21) PO TBPK: ORAL_TABLET | Freq: Every day | ORAL | 0 refills | Status: DC

## 2021-06-10 NOTE — Discharge Instructions (Addendum)
-  Your chest x-ray looks okay.  You do not have pneumonia. -Prednisone taper. I recommend taking this in the morning as it could give you energy.  Avoid NSAIDs like ibuprofen and alleve while taking this medication as they can increase your risk of stomach upset and even GI bleeding when in combination with a steroid. You can continue tylenol (acetaminophen) up to 1000mg  3x daily. -Continue inhalers.  -Seek additional medical attention if symptoms getting worse instead of better, like fever/chills, shortness of breath, weakness, dizziness, chest pain.

## 2021-06-10 NOTE — ED Provider Notes (Signed)
[facility]    CSN: [id number] Arrival date & time: 06/10/21  1228      History   Chief Complaint Chief Complaint  Patient presents with   Cough   Nasal Congestion   Sore Throat    HPI Marc Flores is a 80 y.o. male presenting with cough for about 5 days.  Medical history COPD, dyspnea on exertion, bronchiectasis, bladder cancer.  Describes 4 days of viral symptoms, initially with sore throat but no longer.  Now with hacking cough productive of yellow sputum and some shortness of breath following coughing episodes.  Dyspnea on exertion at baseline.  Denies fever/chills, weakness, dizziness.  Tolerating fluids and food.  HPI  Past Medical History:  Diagnosis Date   Arthritis    Bruises easily    Cancer (HCC)    BLADDER, sees Dr. Diona Fanti   COPD (chronic obstructive pulmonary disease) [facility])    sees Dr. Melvyn Novas     Diverticulitis    Dyspnea    ON EXERTION   History of chickenpox    Hyperlipidemia     Patient Active Problem List   Diagnosis Date Noted   Gout 05/11/2020   Dyslipidemia 05/11/2020   Bronchiectasis without acute exacerbation ([place]) 12/29/2016   DOE (dyspnea on exertion) 12/08/2011   COPD GOLD III  12/08/2011    Past Surgical History:  Procedure Laterality Date   COLONOSCOPY  04/13/2020   per Dr. Carol Ada, adenomaotus polyps, repeat in 7 yrs    CYSTOSCOPY  11/27/2019   IN [facility]   CYSTOSCOPY W/ RETROGRADES Bilateral 12/18/2019   Procedure: CYSTOSCOPY WITH RETROGRADE PYELOGRAM;  Surgeon: Franchot Gallo, MD;  Location: [facility];  Service: Urology;  Laterality: Bilateral;   TONSILLECTOMY  AS CHILD   TRANSURETHRAL RESECTION OF BLADDER TUMOR WITH MITOMYCIN-C N/A 12/18/2019   Procedure: TRANSURETHRAL RESECTION OF BLADDER TUMOR, RIGHT URETEROSCOPY AND RIGHT URETERAL STENT PLACEMENT;  Surgeon: Franchot Gallo, MD;  Location: [facility];  Service: Urology;  Laterality: N/A;   WISDOM TOOTH  EXTRACTION  AS CHILD       Home Medications    Prior to Admission medications   Medication Sig Start Date End Date Taking? Authorizing Provider  predniSONE (STERAPRED UNI-PAK 21 TAB) 10 MG (21) TBPK tablet Take by mouth daily. Take 6 tabs by mouth daily  for 2 days, then 5 tabs for 2 days, then 4 tabs for 2 days, then 3 tabs for 2 days, 2 tabs for 2 days, then 1 tab by mouth daily for 2 days 06/10/21  Yes Phillip Heal, Sherlon Handing, PA-C  atorvastatin (LIPITOR) 10 MG tablet TAKE 1 TABLET BY MOUTH EVERY DAY 12/28/20   Laurey Morale, MD  Budeson-Glycopyrrol-Formoterol (BREZTRI AEROSPHERE) 160-9-4.8 MCG/ACT AERO Take 2 puffs first thing in am and then another 2 puffs about 12 hours later. 12/03/20   [name], MD  Budeson-Glycopyrrol-Formoterol (BREZTRI AEROSPHERE) 160-9-4.8 MCG/ACT AERO Inhale 2 puffs into the lungs in the morning and at bedtime. 05/18/21   [name], MD  colchicine 0.6 MG tablet Take 2 tablets twice a day as needed for gout attacks 09/28/20   Laurey Morale, MD  [facility] HFA 108 [phone] Base) MCG/ACT inhaler Inhale 2 puffs into the lungs as directed. 11/17/19   [name], MD  tamsulosin (FLOMAX) 0.4 MG CAPS capsule Take 1 capsule by mouth daily after supper.  08/21/18   [provider]    Family History Family History  Problem Relation Age of  Onset   Glaucoma Mother    Diabetes Mother    Hearing loss Mother    Heart disease Mother    Heart disease Father    Atrial fibrillation Father    Arthritis Father    Cancer Father    Hearing loss Father    Hypertension Father     Social History Social History   Tobacco Use   Smoking status: Former    Packs/day: 1.00    Years: 30.00    Pack years: 30.00    Types: Cigarettes    Quit date: 12/08/1991    Years since quitting: 29.5   Smokeless tobacco: Never  Vaping Use   Vaping Use: Never used  Substance Use Topics   Alcohol use: Yes   Drug use: No     Allergies   Patient has no known allergies.   Review  of Systems Review of Systems  Constitutional:  Negative for appetite change, chills and fever.  HENT:  Positive for congestion. Negative for ear pain, rhinorrhea, sinus pressure, sinus pain and sore throat.   Eyes:  Negative for redness and visual disturbance.  Respiratory:  Positive for cough. Negative for chest tightness, shortness of breath and wheezing.   Cardiovascular:  Negative for chest pain and palpitations.  Gastrointestinal:  Negative for abdominal pain, constipation, diarrhea, nausea and vomiting.  Genitourinary:  Negative for dysuria, frequency and urgency.  Musculoskeletal:  Negative for myalgias.  Neurological:  Negative for dizziness, weakness and headaches.  Psychiatric/Behavioral:  Negative for confusion.   All other systems reviewed and are negative.   Physical Exam Triage Vital Signs ED Triage Vitals  Enc Vitals Group     BP 06/10/21 1500 (!) 153/82     Pulse Rate 06/10/21 1500 100     Resp 06/10/21 1500 18     Temp 06/10/21 1500 98.2 F (36.8 C)     Temp Source 06/10/21 1500 Oral     SpO2 06/10/21 1500 96 %     Weight --      Height --      Head Circumference --      Peak Flow --      Pain Score 06/10/21 1458 0     Pain Loc --      Pain Edu? --      Excl. in [place]? --    No data found.  Updated Vital Signs BP (!) 153/82 (BP Location: Left Arm)   Pulse 100   Temp 98.2 F (36.8 C) (Oral)   Resp 18   SpO2 96%   Visual Acuity Right Eye Distance:   Left Eye Distance:   Bilateral Distance:    Right Eye Near:   Left Eye Near:    Bilateral Near:     Physical Exam Vitals reviewed.  Constitutional:      General: He is not in acute distress.    Appearance: Normal appearance. He is not ill-appearing.  HENT:     Head: Normocephalic and atraumatic.     Right Ear: Tympanic membrane, ear canal and external ear normal. No tenderness. No middle ear effusion. There is no impacted cerumen. Tympanic membrane is not perforated, erythematous, retracted or  bulging.     Left Ear: Tympanic membrane, ear canal and external ear normal. No tenderness.  No middle ear effusion. There is no impacted cerumen. Tympanic membrane is not perforated, erythematous, retracted or bulging.     Nose: Nose normal. No congestion.     Mouth/Throat:  Mouth: Mucous membranes are moist.     Pharynx: Uvula midline. No oropharyngeal exudate or posterior oropharyngeal erythema.  Eyes:     Extraocular Movements: Extraocular movements intact.     Pupils: Pupils are equal, round, and reactive to light.  Cardiovascular:     Rate and Rhythm: Normal rate and regular rhythm.     Heart sounds: Normal heart sounds.  Pulmonary:     Effort: Pulmonary effort is normal.     Breath sounds: Examination of the right-upper field reveals wheezing. Examination of the right-middle field reveals wheezing. Examination of the right-lower field reveals wheezing. Wheezing present. No decreased breath sounds, rhonchi or rales.     Comments: Wheezing R lung fields only Abdominal:     Palpations: Abdomen is soft.     Tenderness: There is no abdominal tenderness. There is no guarding or rebound.  Neurological:     General: No focal deficit present.     Mental Status: He is alert and oriented to person, place, and time.  Psychiatric:        Mood and Affect: Mood normal.        Behavior: Behavior normal.        Thought Content: Thought content normal.        Judgment: Judgment normal.     UC Treatments / Results  Labs (all labs ordered are listed, but only abnormal results are displayed) Labs Reviewed - No data to display  EKG   Radiology DG Chest 2 View  Result Date: 06/10/2021 CLINICAL DATA:  Productive cough, dyspnea, COPD EXAM: CHEST - 2 VIEW COMPARISON:  12/18/2016 chest radiograph. FINDINGS: Stable cardiomediastinal silhouette with normal heart size. No pneumothorax. No pleural effusion. Hyperinflated lungs. Emphysema. No pulmonary edema. No acute consolidative airspace  disease. IMPRESSION: 1. No acute cardiopulmonary disease. 2. Hyperinflated lungs and emphysema, compatible with reported history of COPD. Electronically Signed   By: Ilona Sorrel M.D.   On: 06/10/2021 15:57    Procedures Procedures (including critical care time)  Medications Ordered in UC Medications - No data to display  Initial Impression / Assessment and Plan / UC Course  I have reviewed the triage vital signs and the nursing notes.  Pertinent labs & imaging results that were available during my care of the patient were reviewed by me and considered in my medical decision making (see chart for details).     This patient is a very pleasant 80 y.o. year old male presenting with COPD exacerbation following virus.. Today this pt is afebrile nontachycardic nontachypneic, oxygenating well on room air, wheezes but no rhonchi or rales.   Ddx does include covid-19 but patient declines testing for this. He is fully vaccinated and boosted x1.  CXR- no acute cardiopulm ds.  A1c 5.5 05/2021. Prednisone taper as below. Continue Dulera, proair inhalers.   ED return precautions discussed. Patient verbalizes understanding and agreement.   Coding Level 4 for acute exacerbation of chronic condition, and prescription drug management   Final Clinical Impressions(s) / UC Diagnoses   Final diagnoses:  Viral URI with cough     Discharge Instructions      -Your chest x-ray looks okay.  You do not have pneumonia. -Prednisone taper. I recommend taking this in the morning as it could give you energy.  Avoid NSAIDs like ibuprofen and alleve while taking this medication as they can increase your risk of stomach upset and even GI bleeding when in combination with a steroid. You can continue tylenol (acetaminophen) up to  1000mg  3x daily. -Continue inhalers.  -Seek additional medical attention if symptoms getting worse instead of better, like fever/chills, shortness of breath, weakness, dizziness, chest  pain.      ED Prescriptions     Medication Sig Dispense Auth. Provider   predniSONE (STERAPRED UNI-PAK 21 TAB) 10 MG (21) TBPK tablet Take by mouth daily. Take 6 tabs by mouth daily  for 2 days, then 5 tabs for 2 days, then 4 tabs for 2 days, then 3 tabs for 2 days, 2 tabs for 2 days, then 1 tab by mouth daily for 2 days 42 tablet Hazel Sams, PA-C      PDMP not reviewed this encounter.   Hazel Sams, PA-C 06/10/21 1620

## 2021-06-10 NOTE — ED Triage Notes (Signed)
Pt presents with productive cough, sore throat, and fatigue Xs 4 days.

## 2021-06-14 ENCOUNTER — Telehealth: Payer: Self-pay | Admitting: Pharmacist

## 2021-06-14 NOTE — Chronic Care Management (AMB) (Signed)
    Chronic Care Management Pharmacy Assistant   Name: Artrell Lawless  MRN: [id number] DOB: Jan 14, 1941   Reason for Encounter: Reschedule Initial Visit with [name] Clinical Pharmacist per Thurmond Butts PTM  Attempted to reach patient on 06-14-2021 left VM for return call to reschedule in office appointment with Watt Climes that was cancelled in June   Medications: Outpatient Encounter Medications as of 06/14/2021  Medication Sig   atorvastatin (LIPITOR) 10 MG tablet TAKE 1 TABLET BY MOUTH EVERY DAY   Budeson-Glycopyrrol-Formoterol (BREZTRI AEROSPHERE) 160-9-4.8 MCG/ACT AERO Take 2 puffs first thing in am and then another 2 puffs about 12 hours later.   Budeson-Glycopyrrol-Formoterol (BREZTRI AEROSPHERE) 160-9-4.8 MCG/ACT AERO Inhale 2 puffs into the lungs in the morning and at bedtime.   colchicine 0.6 MG tablet Take 2 tablets twice a day as needed for gout attacks   predniSONE (STERAPRED UNI-PAK 21 TAB) 10 MG (21) TBPK tablet Take by mouth daily. Take 6 tabs by mouth daily  for 2 days, then 5 tabs for 2 days, then 4 tabs for 2 days, then 3 tabs for 2 days, 2 tabs for 2 days, then 1 tab by mouth daily for 2 days   PROAIR HFA 108 (90 Base) MCG/ACT inhaler Inhale 2 puffs into the lungs as directed.   tamsulosin (FLOMAX) 0.4 MG CAPS capsule Take 1 capsule by mouth daily after supper.    Facility-Administered Encounter Medications as of 06/14/2021  Medication   gemcitabine (GEMZAR) chemo syringe for bladder instillation 2,000 mg    Care Gaps: Hepatitis C Screening - Overdue Zoster Vaccine - Overdue COVID Booster #4 [employer]) - Overdue AWV 12/16/20  Star Rating Drugs: Atorvastatin (Lipitor) 10 mg - Last filled 03-27-2021 90 DS at [facility] Pharmacist Assistant [phone]

## 2021-06-21 ENCOUNTER — Emergency Department (HOSPITAL_COMMUNITY): Payer: PPO

## 2021-06-21 ENCOUNTER — Emergency Department (HOSPITAL_COMMUNITY)
Admission: EM | Admit: 2021-06-21 | Discharge: 2021-06-21 | Disposition: A | Discharge status: Home / Self Care | Payer: PPO | Attending: Emergency Medicine | Admitting: Emergency Medicine

## 2021-06-21 DIAGNOSIS — R000 Tachycardia, unspecified: Secondary | ICD-10-CM | POA: Insufficient documentation

## 2021-06-21 DIAGNOSIS — R059 Cough, unspecified: Secondary | ICD-10-CM | POA: Diagnosis not present

## 2021-06-21 DIAGNOSIS — J181 Lobar pneumonia, unspecified organism: Secondary | ICD-10-CM | POA: Insufficient documentation

## 2021-06-21 DIAGNOSIS — Z87891 Personal history of nicotine dependence: Secondary | ICD-10-CM | POA: Diagnosis not present

## 2021-06-21 DIAGNOSIS — J189 Pneumonia, unspecified organism: Secondary | ICD-10-CM | POA: Diagnosis not present

## 2021-06-21 DIAGNOSIS — Z7951 Long term (current) use of inhaled steroids: Secondary | ICD-10-CM | POA: Diagnosis not present

## 2021-06-21 DIAGNOSIS — Z8551 Personal history of malignant neoplasm of bladder: Secondary | ICD-10-CM | POA: Diagnosis not present

## 2021-06-21 DIAGNOSIS — Z79899 Other long term (current) drug therapy: Secondary | ICD-10-CM | POA: Insufficient documentation

## 2021-06-21 DIAGNOSIS — J449 Chronic obstructive pulmonary disease, unspecified: Secondary | ICD-10-CM | POA: Insufficient documentation

## 2021-06-21 DIAGNOSIS — Z20822 Contact with and (suspected) exposure to covid-19: Secondary | ICD-10-CM | POA: Diagnosis not present

## 2021-06-21 DIAGNOSIS — J439 Emphysema, unspecified: Secondary | ICD-10-CM | POA: Diagnosis not present

## 2021-06-21 DIAGNOSIS — R0602 Shortness of breath: Secondary | ICD-10-CM | POA: Diagnosis present

## 2021-06-21 LAB — BASIC METABOLIC PANEL
Anion gap: 10 (ref 5–15)
BUN: 16 mg/dL (ref 8–23)
CO2: 31 mmol/L (ref 22–32)
Calcium: 9.2 mg/dL (ref 8.9–10.3)
Chloride: 96 mmol/L — ABNORMAL LOW (ref 98–111)
Creatinine, Ser: 1.5 mg/dL — ABNORMAL HIGH (ref 0.61–1.24)
GFR, Estimated: 47 mL/min — ABNORMAL LOW (ref >60)
Glucose, Bld: 94 mg/dL (ref 70–99)
Potassium: 4 mmol/L (ref 3.5–5.1)
Sodium: 137 mmol/L (ref 135–145)

## 2021-06-21 LAB — CBC
HCT: 56.6 % — ABNORMAL HIGH (ref 39.0–52.0)
Hemoglobin: 18.1 g/dL — ABNORMAL HIGH (ref 13.0–17.0)
MCH: 29.5 pg (ref 26.0–34.0)
MCHC: 32 g/dL (ref 30.0–36.0)
MCV: 92.3 fL (ref 80.0–100.0)
Platelets: 289 10*3/uL (ref 150–400)
RBC: 6.13 MIL/uL — ABNORMAL HIGH (ref 4.22–5.81)
RDW: 13.4 % (ref 11.5–15.5)
WBC: 30.2 10*3/uL — ABNORMAL HIGH (ref 4.0–10.5)
nRBC: 0 % (ref 0.0–0.2)

## 2021-06-21 LAB — RESP PANEL BY RT-PCR (FLU A&B, COVID) ARPGX2
Influenza A by PCR: NEGATIVE
Influenza B by PCR: NEGATIVE
SARS Coronavirus 2 by RT PCR: NEGATIVE

## 2021-06-21 MED ORDER — ALBUTEROL SULFATE HFA 108 (90 BASE) MCG/ACT IN AERS
2.0000 | INHALATION_SPRAY | RESPIRATORY_TRACT | Status: DC | PRN
  Filled 2021-06-21: qty 6.7

## 2021-06-21 MED ORDER — ACETAMINOPHEN 325 MG PO TABS
650.0000 mg | ORAL_TABLET | Freq: Once | ORAL | Status: AC
  Administered 2021-06-21: 650 mg via ORAL
  Filled 2021-06-21: qty 2

## 2021-06-21 MED ORDER — AZITHROMYCIN 250 MG PO TABS: 250.0000 mg | ORAL_TABLET | Freq: Every day | ORAL | 0 refills | Status: AC

## 2021-06-21 MED ORDER — SODIUM CHLORIDE 0.9 % IV BOLUS
1000.0000 mL | Freq: Once | INTRAVENOUS | Status: AC
  Administered 2021-06-21: 1000 mL via INTRAVENOUS

## 2021-06-21 MED ORDER — AMOXICILLIN-POT CLAVULANATE 875-125 MG PO TABS: 1.0000 | ORAL_TABLET | Freq: Two times a day (BID) | ORAL | 0 refills | Status: DC

## 2021-06-21 MED ORDER — PREDNISONE 20 MG PO TABS: 40.0000 mg | ORAL_TABLET | Freq: Every day | ORAL | 0 refills | Status: DC

## 2021-06-21 MED ORDER — DEXAMETHASONE SODIUM PHOSPHATE 10 MG/ML IJ SOLN
10.0000 mg | Freq: Once | INTRAMUSCULAR | Status: AC
  Administered 2021-06-21: 10 mg via INTRAVENOUS
  Filled 2021-06-21: qty 1

## 2021-06-21 MED ORDER — SODIUM CHLORIDE 0.9 % IV SOLN
1.0000 g | Freq: Once | INTRAVENOUS | Status: AC
  Administered 2021-06-21: 1 g via INTRAVENOUS
  Filled 2021-06-21: qty 10

## 2021-06-21 MED ORDER — SODIUM CHLORIDE 0.9 % IV SOLN
500.0000 mg | Freq: Once | INTRAVENOUS | Status: AC
  Administered 2021-06-21: 500 mg via INTRAVENOUS
  Filled 2021-06-21: qty 500

## 2021-06-21 NOTE — ED Triage Notes (Signed)
Pt here for worsening shob. Dx with bronchitis a few weeks ago and started on prednisone. Pt reports worsening of symptoms.

## 2021-06-21 NOTE — Discharge Instructions (Addendum)
As discussed, it is important you monitor your condition carefully, and do not hesitate to return here if you feel worse, rather than better in the coming days.  Otherwise, please follow-up with your physician.    Be sure to take all medication as directed.  For the next 2 days please use your albuterol every 4 hours, then as needed.  If possible, please avoid using your colchicine while you are completing your course of azithromycin.

## 2021-06-21 NOTE — ED Provider Notes (Addendum)
Emergency Medicine Provider Triage Evaluation Note  Marc Flores , a 80 y.o. male  was evaluated in triage.  Pt complains of 3 weeks of a cough.  It has worsened acutely.  Urgent care worked him up a week ago and put him on a prednisone taper for a presumed bronchitis.  Currently with a productive and worsening cough  Review of Systems  Positive: Cough, shortness of breath, difficulty urinating Negative: Pains and chest  Physical Exam  BP (!) 145/83   Pulse (!) 115   Temp 100.2 F (37.9 C)   Resp (!) 22   SpO2 90%  Gen:   Awake, no distress   Resp:  Increased effort MSK:   Moves extremities without difficulty  Other:  Rales noted all throughout.  Medical Decision Making  Medically screening exam initiated at 12:45 PM.  Appropriate orders placed.  Marc Flores was informed that the remainder of the evaluation will be completed by another provider, this initial triage assessment does not replace that evaluation, and the importance of remaining in the ED until their evaluation is complete.  Ill-appearing with junky cough   Rhae Hammock, PA-C 06/21/21 1516    Carmin Muskrat, MD 06/21/21 1701

## 2021-06-21 NOTE — ED Provider Notes (Signed)
[facility] EMERGENCY DEPARTMENT Provider Note   CSN: [id number] Arrival date & time: 06/21/21  1126     History Chief Complaint  Patient presents with   Shortness of Breath    Marc Flores is a 80 y.o. male.  HPI Patient presents with his wife who assists with the history. Patient has a history of COPD, hyperlipidemia, but was generally well until about 3 weeks ago.  He noticed mild cough about the time, and with persistency he went to urgent care.  There he was started on steroids, diagnosed with bronchitis.  He notes that over the past 2 days he has had worsening cough, new generalized discomfort, dyspnea.  He is a former smoker, has history of COPD, as above. With worsening symptoms he presents for evaluation.  He denies confusion, disorientation, vomiting, abdominal pain, substantial chest pain.  No relief with anything, including steroids, as above and albuterol.  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 Melvyn Novas     Diverticulitis    Dyspnea    ON EXERTION   History of chickenpox    Hyperlipidemia     Patient Active Problem List   Diagnosis Date Noted   Gout 05/11/2020   Dyslipidemia 05/11/2020   Bronchiectasis without acute exacerbation ([place]) 12/29/2016   DOE (dyspnea on exertion) 12/08/2011   COPD GOLD III  12/08/2011    Past Surgical History:  Procedure Laterality Date   COLONOSCOPY  04/13/2020   per Dr. Carol Ada, adenomaotus polyps, repeat in 7 yrs    CYSTOSCOPY  11/27/2019   IN [facility]   CYSTOSCOPY W/ RETROGRADES Bilateral 12/18/2019   Procedure: CYSTOSCOPY WITH RETROGRADE PYELOGRAM;  Surgeon: Franchot Gallo, MD;  Location: [facility];  Service: Urology;  Laterality: Bilateral;   TONSILLECTOMY  AS CHILD   TRANSURETHRAL RESECTION OF BLADDER TUMOR WITH MITOMYCIN-C  N/A 12/18/2019   Procedure: TRANSURETHRAL RESECTION OF BLADDER TUMOR, RIGHT URETEROSCOPY AND RIGHT URETERAL STENT PLACEMENT;  Surgeon: Franchot Gallo, MD;  Location: [facility];  Service: Urology;  Laterality: N/A;   WISDOM TOOTH EXTRACTION  AS CHILD       Family History  Problem Relation Age of Onset   Glaucoma Mother    Diabetes Mother    Hearing loss Mother    Heart disease Mother    Heart disease Father    Atrial fibrillation Father    Arthritis Father    Cancer Father    Hearing loss Father    Hypertension Father     Social History   Tobacco Use   Smoking status: Former    Packs/day: 1.00    Years: 30.00    Pack years: 30.00    Types: Cigarettes    Quit date: 12/08/1991    Years since quitting: 29.5   Smokeless tobacco: Never  Vaping Use   Vaping Use: Never used  Substance Use Topics   Alcohol use: Yes   Drug use: No    Home Medications Prior to Admission medications   Medication Sig Start Date End Date Taking? Authorizing Provider  Budeson-Glycopyrrol-Formoterol (BREZTRI AEROSPHERE) 160-9-4.8 MCG/ACT AERO Take 2 puffs first thing in am and then another 2 puffs about 12 hours later. 12/03/20  Yes [name], MD  PROAIR HFA 108 [phone] Base) MCG/ACT inhaler Inhale 2 puffs into  the lungs as directed. Patient taking differently: Inhale 2 puffs into the lungs 3 (three) times daily. 11/17/19  Yes [name], MD  atorvastatin (LIPITOR) 10 MG tablet TAKE 1 TABLET BY MOUTH EVERY DAY 12/28/20   Laurey Morale, MD  Budeson-Glycopyrrol-Formoterol (BREZTRI AEROSPHERE) 160-9-4.8 MCG/ACT AERO Inhale 2 puffs into the lungs in the morning and at bedtime. 05/18/21   [name], MD  colchicine 0.6 MG tablet Take 2 tablets twice a day as needed for gout attacks 09/28/20   Laurey Morale, MD  predniSONE (STERAPRED UNI-PAK 21 TAB) 10 MG (21) TBPK tablet Take by mouth daily. Take 6 tabs by mouth daily  for 2 days, then 5 tabs for 2 days, then 4 tabs for 2 days,  then 3 tabs for 2 days, 2 tabs for 2 days, then 1 tab by mouth daily for 2 days 06/10/21   Hazel Sams, PA-C  tamsulosin (FLOMAX) 0.4 MG CAPS capsule Take 1 capsule by mouth daily after supper.  08/21/18   [provider]    Allergies    Patient has no known allergies.  Review of Systems   Review of Systems  Constitutional:        Per HPI, otherwise negative  HENT:         Per HPI, otherwise negative  Respiratory:         Per HPI, otherwise negative  Cardiovascular:        Per HPI, otherwise negative  Gastrointestinal:  Negative for vomiting.  Endocrine:       Negative aside from HPI  Genitourinary:        Neg aside from HPI   Musculoskeletal:        Per HPI, otherwise negative  Skin: Negative.   Neurological:  Negative for syncope.   Physical Exam Updated Vital Signs BP 116/75   Pulse 95   Temp 100.2 F (37.9 C)   Resp 17   SpO2 92%   Physical Exam Vitals and nursing note reviewed.  Constitutional:      General: He is not in acute distress.    Appearance: He is well-developed.  HENT:     Head: Normocephalic and atraumatic.  Eyes:     Conjunctiva/sclera: Conjunctivae normal.  Cardiovascular:     Rate and Rhythm: Regular rhythm. Tachycardia present.  Pulmonary:     Effort: Tachypnea present.     Breath sounds: Rhonchi present.  Abdominal:     General: There is no distension.  Skin:    General: Skin is warm and dry.  Neurological:     Mental Status: He is alert and oriented to person, place, and time.    ED Results / Procedures / Treatments   Labs (all labs ordered are listed, but only abnormal results are displayed) Labs Reviewed  BASIC METABOLIC PANEL - Abnormal; Notable for the following components:      Result Value   Chloride 96 (*)    Creatinine, Ser 1.50 (*)    GFR, Estimated 47 (*)    All other components within normal limits  CBC - Abnormal; Notable for the following components:   WBC 30.2 (*)    RBC 6.13 (*)    Hemoglobin 18.1  (*)    HCT 56.6 (*)    All other components within normal limits  RESP PANEL BY RT-PCR (FLU A&B, COVID) [id number]    EKG EKG Interpretation  Date/Time:  Tuesday June 21 2021 12:44:43 EDT Ventricular Rate:  124 PR Interval:  148 QRS Duration: 76 QT Interval:  290 QTC Calculation: 416 R Axis:   158 Text Interpretation: Sinus tachycardia with Premature atrial complexes Possible Right ventricular hypertrophy Abnormal ECG Confirmed by Carmin Muskrat [phone]) on 06/21/2021 3:57:29 PM  Radiology DG Chest 2 View  Result Date: 06/21/2021 CLINICAL DATA:  Shortness of breath/cough. Additional history provided: Cough for 3 weeks, patient seen 2 weeks ago and given medications for bronchitis. EXAM: CHEST - 2 VIEW COMPARISON:  Prior chest radiographs 06/10/2021 and earlier. CT chest 12/28/2016. FINDINGS: Heart size within normal limits. Aortic atherosclerosis. Known emphysema more fully characterized on the prior chest CT of 12/28/2016. Opacity within the lateral right lung base, more conspicuous as compared to the prior chest radiograph of 06/10/2021, and suspicious for pneumonia. No evidence of pleural effusion or pneumothorax. No acute bony abnormality identified. IMPRESSION: Opacity within the lateral right lung base suspicious for pneumonia. Followup PA and lateral chest radiographs are recommended in 3-4 weeks following trial of antibiotic therapy to ensure resolution and exclude alternative etiologies (such as underlying malignancy). Aortic Atherosclerosis (ICD10-I70.0) and Emphysema (ICD10-J43.9). Electronically Signed   By: Kellie Simmering D.O.   On: 06/21/2021 13:35    Procedures Procedures   Medications Ordered in ED Medications  albuterol (VENTOLIN HFA) 108 (90 Base) MCG/ACT inhaler 2 puff (has no administration in time range)  azithromycin (ZITHROMAX) 500 mg in sodium chloride 0.9 % 250 mL IVPB (has no administration in time range)  cefTRIAXone (ROCEPHIN) 1 g in sodium chloride 0.9 % 100  mL IVPB (has no administration in time range)  dexamethasone (DECADRON) injection 10 mg (has no administration in time range)  sodium chloride 0.9 % bolus 1,000 mL (has no administration in time range)  acetaminophen (TYLENOL) tablet 650 mg (650 mg Oral Given by Other 06/21/21 1644)    ED Course  I have reviewed the triage vital signs and the nursing notes.  Pertinent labs & imaging results that were available during my care of the patient were reviewed by me and considered in my medical decision making (see chart for details).  Update: I did review the patient's chart, x-ray, demonstrated the x-ray to the patient and his wife at bedside.  With concern for pneumonia patient is starting antibiotics.  Patient is tachycardic, tachypneic, and with slight elevation in creatinine will require fluid resuscitation. Patient's initial leukocytosis is noted.  Greater than 30,000, though he is on steroids currently.   7:59 PM On repeat exam patient is calm, in no distress, states that he feels better.  We reviewed all findings again.  Pulse ox 93, 94%, consistent with COPD, the patient is on room air.  Heart rate 90s, substantial improvement from 110 on arrival.  Given his improvement here, absence of requirement for supplemental oxygen we discussed admission versus outpatient follow-up, in shared decision-making.  Patient's leukocytosis may be secondary to steroids versus pneumonia, likely accommodation of both.  With his vital sign improvement here, absence of distress, absence of increased work of breathing, patient stated a preference for close outpatient follow-up, after knowledge and return precautions.  Patient discharged in stable condition. MDM Rules/Calculators/A&P MDM Number of Diagnoses or Management Options Community acquired pneumonia of right lower lobe of lung: new, needed workup   Amount and/or Complexity of Data Reviewed Clinical lab tests: ordered and reviewed Tests in the radiology  section of CPT: ordered and reviewed Tests in the medicine section of CPT: reviewed and ordered Decide to obtain previous medical records or to obtain history from someone other  than the patient: yes Obtain history from someone other than the patient: yes Review and summarize past medical records: yes Independent visualization of images, tracings, or specimens: yes  Risk of Complications, Morbidity, and/or Mortality Presenting problems: high Diagnostic procedures: high Management options: high  Critical Care Total time providing critical care: < 30 minutes  Patient Progress Patient progress: improved   Final Clinical Impression(s) / ED Diagnoses Final diagnoses:  Community acquired pneumonia of right lower lobe of lung    Rx / DC Orders ED Discharge Orders     None        Carmin Muskrat, MD 06/21/21 2000

## 2021-06-22 ENCOUNTER — Telehealth: Payer: Self-pay | Admitting: *Deleted

## 2021-06-22 NOTE — Telephone Encounter (Signed)
Error

## 2021-06-29 ENCOUNTER — Ambulatory Visit (INDEPENDENT_AMBULATORY_CARE_PROVIDER_SITE_OTHER): Payer: PPO | Admitting: Family Medicine

## 2021-06-29 ENCOUNTER — Encounter: Payer: Self-pay | Admitting: Family Medicine

## 2021-06-29 ENCOUNTER — Ambulatory Visit: Payer: PPO | Admitting: Family Medicine

## 2021-06-29 ENCOUNTER — Other Ambulatory Visit: Payer: Self-pay

## 2021-06-29 VITALS — BP 110/76 | HR 87 | Temp 98.2°F | Wt 167.1 lb

## 2021-06-29 DIAGNOSIS — J189 Pneumonia, unspecified organism: Secondary | ICD-10-CM

## 2021-06-29 MED ORDER — AMOXICILLIN-POT CLAVULANATE 875-125 MG PO TABS: 1.0000 | ORAL_TABLET | Freq: Two times a day (BID) | ORAL | 0 refills | Status: DC

## 2021-06-29 NOTE — Progress Notes (Signed)
   Subjective:    Patient ID: Marc Flores, male    DOB: 11/22/1940, 80 y.o.   MRN: [id number]  HPI Here to follow up a pneumonia. He was seen in the ED on 06-21-21 for 3 weeks of coughing and SOB. That day he had a fever of 100 degrees. He had RLL rales on exam. His WBC was up to 30.2. Oxygen sat was 93%. A CXR revealed a pneumonia in the lateral right lung base. He was given a 7 day course of Augmentin, which he finished yesterday. Since then he feels somewhat better. The cough and SOB have improved, the fever is gone, and his appetite has come back. He is still very fatigued    Review of Systems  Constitutional:  Positive for fatigue. Negative for fever.  Respiratory:  Positive for cough and shortness of breath. Negative for wheezing.   Cardiovascular: Negative.   Gastrointestinal: Negative.       Objective:   Physical Exam Constitutional:      Appearance: Normal appearance. He is not ill-appearing.  Cardiovascular:     Rate and Rhythm: Normal rate and regular rhythm.     Pulses: Normal pulses.     Heart sounds: Normal heart sounds.  Pulmonary:     Effort: Pulmonary effort is normal. No respiratory distress.     Breath sounds: No wheezing or rhonchi.     Comments: Rales are present in the right lung base  Neurological:     Mental Status: He is alert.          Assessment & Plan:  Partially treated RLL pneumonia. We will treat this with 7 more days of Augmentin. He will return for a follow up exam and another CXR in 2 weeks. We spent a total of ( 35  ) minutes reviewing records and discussing these issues.  Alysia Penna, MD

## 2021-07-13 ENCOUNTER — Other Ambulatory Visit: Payer: Self-pay

## 2021-07-13 ENCOUNTER — Encounter: Payer: Self-pay | Admitting: Family Medicine

## 2021-07-13 ENCOUNTER — Ambulatory Visit (INDEPENDENT_AMBULATORY_CARE_PROVIDER_SITE_OTHER): Payer: PPO | Admitting: Family Medicine

## 2021-07-13 VITALS — BP 120/80 | HR 80 | Temp 98.1°F | Wt 169.0 lb

## 2021-07-13 DIAGNOSIS — J189 Pneumonia, unspecified organism: Secondary | ICD-10-CM | POA: Diagnosis not present

## 2021-07-13 NOTE — Progress Notes (Signed)
   Subjective:    Patient ID: Numair Masden, male    DOB: 09-Nov-1940, 80 y.o.   MRN: [id number]  HPI Here to follow up a RLL penumonia. He has completed a 14 day course of Augmentin, and he feels almost completely back to normal. He still has some slight SOB and fatigue, but the cough has totally resolved.    Review of Systems  Constitutional:  Positive for fatigue. Negative for fever.  Respiratory:  Positive for shortness of breath. Negative for cough, chest tightness and wheezing.   Cardiovascular: Negative.       Objective:   Physical Exam Constitutional:      Appearance: Normal appearance. He is not ill-appearing.  Cardiovascular:     Rate and Rhythm: Normal rate and regular rhythm.     Pulses: Normal pulses.     Heart sounds: Normal heart sounds.  Pulmonary:     Effort: Pulmonary effort is normal. No respiratory distress.     Breath sounds: No wheezing or rales.     Comments: There are some faint rhonchi in the right base, but the rales have resolved  Neurological:     Mental Status: He is alert.          Assessment & Plan:  RLL pneumonia, resolved. Follow up as needed. Alysia Penna, MD

## 2021-10-26 ENCOUNTER — Other Ambulatory Visit: Payer: Self-pay | Admitting: Family Medicine

## 2021-10-26 DIAGNOSIS — M1A9XX0 Chronic gout, unspecified, without tophus (tophi): Secondary | ICD-10-CM

## 2021-11-08 ENCOUNTER — Telehealth: Payer: Self-pay | Admitting: Pharmacist

## 2021-11-08 NOTE — Chronic Care Management (AMB) (Signed)
? ? ?  Chronic Care Management ?Pharmacy Assistant  ? ?Name: Marc Flores  MRN: [id number] DOB: [birth year] ? ?Reason for Encounter: Offer to reschedule Missed Initial Encounter with Marc Flores Clinical Pharmacist ?  ?Recent office visits:  ?07/13/21 Marc Morale, MD - Patient presented for Pneumonia of right lower lobe due to infectious organism. Stopped Amoxicillin ? ?06/29/21 Marc Morale, MD - Patient presented for Pneumonia of right lower lobe due to infectious organism. Stopped Prednisone ? ?05/12/21 Marc Morale, MD - Patient presented for Preventative health care and other concerns. No medication changes. ? ?Recent consult visits:  ?None ? ?Hospital visits:  ?Medication Reconciliation was completed by comparing discharge summary, patient?s EMR and Pharmacy list, and upon discussion with patient. ? ?Patient presented to [facility] hospital ED on  06/21/21 due to Shortness of Breath. Patient was present for 7 hours ? ?New?Medications Started at [facility] Discharge:?? ?-started  ?amoxicillin-clavulanate 875-125 MG tablet ?azithromycin 250 MG tablet ?predniSONE 20 MG tablet ? ?Medication Changes at Hospital Discharge: ?-Changed None ? ?Medications Discontinued at Hospital Discharge: ?-Stopped ?none ? ?Medications that remain the same after Hospital Discharge:??  ?-All other medications will remain the same.   ? ? ? ?Patient presented to [facility] on  06/10/21 due to [facility] URI. Patient was present for 1hours ? ?New?Medications Started at [facility] Discharge:?? ?-started  ? ?predniSONE 10 MG tablet ? ?Medication Changes at Hospital Discharge: ?-Changed None ? ?Medications Discontinued at Hospital Discharge: ?-Stopped ?none ? ?Medications that remain the same after Hospital Discharge:??  ?-All other medications will remain the same.   ? ? ?Medications: ?Outpatient Encounter Medications as of 11/08/2021  ?Medication Sig  ? acetaminophen (TYLENOL) 500 MG tablet Take  500-1,000 mg by mouth every 6 (six) hours as needed for mild pain.  ? atorvastatin (LIPITOR) 10 MG tablet TAKE 1 TABLET BY MOUTH EVERY DAY (Patient taking differently: Take 10 mg by mouth daily after supper.)  ? Budeson-Glycopyrrol-Formoterol (BREZTRI AEROSPHERE) 160-9-4.8 MCG/ACT AERO Take 2 puffs first thing in am and then another 2 puffs about 12 hours later. (Patient taking differently: Inhale 2 puffs into the lungs in the morning and at bedtime.)  ? colchicine 0.6 MG tablet TAKE 2 TABLETS BY MOUTH TWICE DAILY AS NEEDED FOR GOUT FLARE  ? PROAIR HFA 108 (90 Base) MCG/ACT inhaler Inhale 2 puffs into the lungs as directed. (Patient taking differently: Inhale 2 puffs into the lungs 3 (three) times daily.)  ? tamsulosin (FLOMAX) 0.4 MG CAPS capsule Take 0.4 mg by mouth daily after supper.  ? ?Facility-Administered Encounter Medications as of 11/08/2021  ?Medication  ? gemcitabine (GEMZAR) chemo syringe for bladder instillation 2,000 mg  ?Notes: ?Call to patient to offer to reschedule missed Initial Encounter with Marc Flores Pharmacist. Did not reach patient left voicemail with my contact information for return call. ? ? ?Care Gaps: ?Hepatitis C  Screening - Overdue ?Zoster Vaccine - Overdue ?PNA Vaccine - Overdue ?COVID Booster - Overdue ? ?Star Rating Drugs: ?Atorvastatin  10 mg - Last filled 09/19/21 90 DS at [facility] ? ? ?Ned Clines CMA ?Clinical Pharmacist Assistant ?[phone] ? ?

## 2021-12-05 ENCOUNTER — Ambulatory Visit: Payer: PPO | Admitting: Internal Medicine

## 2021-12-14 ENCOUNTER — Telehealth: Payer: Self-pay | Admitting: Family Medicine

## 2021-12-14 NOTE — Telephone Encounter (Signed)
Left message for patient to call back and schedule Medicare Annual Wellness Visit (AWV) either virtually or in office. Left  my Herbie Drape number [phone] ? ? ?Last AWV ;12/16/20 ?please schedule at anytime with [facility] Nurse Health Advisor 1 or 2 ? ? ? ?

## 2021-12-15 ENCOUNTER — Encounter: Payer: Self-pay | Admitting: Internal Medicine

## 2021-12-15 ENCOUNTER — Ambulatory Visit (INDEPENDENT_AMBULATORY_CARE_PROVIDER_SITE_OTHER): Payer: PPO

## 2021-12-15 ENCOUNTER — Ambulatory Visit: Payer: PPO | Admitting: Internal Medicine

## 2021-12-15 DIAGNOSIS — J449 Chronic obstructive pulmonary disease, unspecified: Secondary | ICD-10-CM

## 2021-12-15 DIAGNOSIS — J479 Bronchiectasis, uncomplicated: Secondary | ICD-10-CM

## 2021-12-15 DIAGNOSIS — J849 Interstitial pulmonary disease, unspecified: Secondary | ICD-10-CM | POA: Diagnosis not present

## 2021-12-15 MED ORDER — PREDNISONE 10 MG PO TABS: ORAL_TABLET | ORAL | 0 refills | Status: DC

## 2021-12-15 NOTE — Progress Notes (Addendum)
?Subjective:  ? ?Patient ID: Marc Flores, male    DOB: [birth year]  MRN: [id number] ? ?  ?Brief patient profile:  ?81  yowm quit smoking 1992 on arrival to [place] at wt 150-160  from [place] with no trouble at all with respiratory problems and new onset sob 2006 referred by Dr Melford Aase for eval of sob 12/08/2011 to pulmonary clinic with confirmed GOLD II copd by spirometry on 06/2011 pre rx by  [facility] chest  ? ? ? ?History of Present Illness  ?12/08/2011 1st pulmonary ov gradual worsening doe indolent onset progressively worsex 6 years to point where may be a couple hundred feet or up the driveway to the house and has to stop at top to rest,  and no better with dulera and spiriva or proare but assoc with audible wheezing, no overt hb. ?rec ?If not revealing will consider going ahead with a cpst - we will call to schedule ?Try prilosec '20mg'$   Take 30-60 min before first meal of the day and Pepcid 20 mg one bedtime until  We complete your work up ?GERD diet  ? ? ? ?12/29/2016  f/u ov/Marc Flores re:  Gold II copd/ worse sob with new dx of bronchiectasis  ?Chief Complaint  ?Patient presents with  ? Pulmonary Consult  ?  Referred by Dr. Johnsie Cancel. Pt c/o DOE with exertion such as walking up hills and carrying out the garbage. He states he has had SOB for years, but this has been worse for 6 months. He also c/o prod cough with light green sputum.    ?all better / able to get up driveway fine p last pulmonary eval and eventually stopped the gerd rx got worse under [facility] f/u > pulmonary eval [facility] dx COPD rec inhalers > better transiently but worse x 6 m to point where can't get up same driveway without stopping half way - saba still helps some ?Mucus slt green x 6 m x 10 min each am ssoc with some pnds  Watery nasal discharge x years      ?  ?12/15/2021  f/u ov/Marc Flores re: GOLD 3/bronchiectasis   maint on breztri   ?Chief Complaint  ?Patient presents with  ? Follow-up  ?  Follow up. Patient is having trouble breathing.   ?Dyspnea:  mb and back  about stops half way does not prechallenge as rec with saba  ?Cough: day > night beige x 15 secs each am  - more if mows grass (riding)   ?Sleeping: flat bed 2 pillows  ?SABA use: 3-4 x daily  ?02: none  ?Covid status:   vax x 3  ? ? ?No obvious day to day or daytime variability or assoc  mucus plugs or hemoptysis or cp or chest tightness, subjective wheeze or overt sinus or hb symptoms.  ? ? Also denies any obvious fluctuation of symptoms with weather or environmental changes or other aggravating or alleviating factors except as outlined above  ? ?No unusual exposure hx or h/o childhood pna/ asthma or knowledge of premature birth. ? ?Current Allergies, Complete Past Medical History, Past Surgical History, Family History, and Social History were reviewed in [employer] record. ? ?ROS  The following are not active complaints unless bolded ?Hoarseness, sore throat, dysphagia, dental problems, itching, sneezing,  nasal congestion or discharge of excess mucus or purulent secretions, ear ache,   fever, chills, sweats, unintended wt loss or wt gain, classically pleuritic or exertional cp,  orthopnea pnd or arm/hand swelling  or leg swelling, presyncope, palpitations, abdominal pain, anorexia, nausea, vomiting, diarrhea  or change in bowel habits or change in bladder habits, change in stools or change in urine, dysuria, hematuria,  rash, arthralgias, visual complaints, headache, numbness, weakness or ataxia or problems with walking or coordination,  change in mood or  memory. ?      ? ?Current Meds  ?Medication Sig  ? atorvastatin (LIPITOR) 10 MG tablet TAKE 1 TABLET BY MOUTH EVERY DAY (Patient taking differently: Take 10 mg by mouth daily after supper.)  ? Budeson-Glycopyrrol-Formoterol (BREZTRI AEROSPHERE) 160-9-4.8 MCG/ACT AERO Take 2 puffs first thing in am and then another 2 puffs about 12 hours later. (Patient taking differently: Inhale 2 puffs into the lungs in the morning and at bedtime.)  ?  colchicine 0.6 MG tablet TAKE 2 TABLETS BY MOUTH TWICE DAILY AS NEEDED FOR GOUT FLARE  ? PROAIR HFA 108 (90 Base) MCG/ACT inhaler Inhale 2 puffs into the lungs as directed. (Patient taking differently: Inhale 2 puffs into the lungs 3 (three) times daily.)  ? tamsulosin (FLOMAX) 0.4 MG CAPS capsule Take 0.4 mg by mouth daily after supper.  ?    ?  ? ?  ?  ?    ?  ?   ?Objective:  ? Physical Exam ?  ?Wts ? ?12/15/2021        175  ?12/03/2020          178  ?11/17/2019        183 ?11/13/2018        186  ?.11/12/2017       178  ? 01/26/2017       176  ?12/29/16 177 lb (80.3 kg)  ?12/20/16 176 lb 6.4 oz (80 kg)  ?12/08/11 179 lb (81.2 kg)  ?  ?Vital signs reviewed  12/15/2021  - Note at rest 02 sats  95% on RA  ? ?General appearance:    very somber amb wm nad   ? ? HEENT : nl exam  ? ? ?NECK :  without JVD/Nodes/TM/ nl carotid upstrokes bilaterally ? ? ?LUNGS: no acc muscle use,  Mod barrel  contour chest wall with bilateral  Distant  mid exp wheeze and  without cough on insp or exp maneuvers and mod  Hyperresonant  to  percussion bilaterally   ? ? ?CV:  RRR  no s3 or murmur or increase in P2, and no edema  ? ?ABD:  soft and nontender with pos mid insp Hoover's  in the supine position. No bruits or organomegaly appreciated, bowel sounds nl ? ?MS:     ext warm without deformities, calf tenderness, cyanosis or clubbing ?No obvious joint restrictions  ? ?SKIN: warm and dry without lesions   ? ?NEURO:  alert, approp, nl sensorium with  no motor or cerebellar deficits apparent.  ?    ? ?CXR PA and Lateral:   12/15/2021 :    ?I personally reviewed images and agree with radiology impression as follows:    ?Interval improvement in mixed interstitial and airspace disease at ?the right lung base, with background chronic scarring and no ?definite evidence of acute cardiopulmonary disease ?  ?   ?Assessment & Plan:  ? ?

## 2021-12-15 NOTE — Patient Instructions (Addendum)
For cough  > Mucinex dm 1200 mg every 12 hours as needed and try flutter valve as needed  ? ?Ok to try albuterol 15 min before an activity (on alternating days)  that you know would usually make you short of breath and see if it makes any difference and if makes none then don't take albuterol after activity unless you can't catch your breath as this means it's the resting that helps, not the albuterol. ? ?Prednisone 10 mg take  4 each am x 2 days,   2 each am x 2 days,  1 each am x 2 days and stop  ? ?Please remember to go to the  x-ray department  for your tests - we will call you with the results when they are available   ? ? ?Please schedule a follow up office visit in 6 weeks, call sooner if needed - bring your inhalers ?    ?

## 2021-12-17 ENCOUNTER — Encounter: Payer: Self-pay | Admitting: Internal Medicine

## 2021-12-17 NOTE — Assessment & Plan Note (Addendum)
CT chest 12/28/16 ?1. No evidence of interstitial lung disease. ?2. Diffuse bronchial wall thickening with moderate centrilobular and ?mild paraseptal emphysema; imaging findings compatible with the ?reported clinical history of COPD. ?3. Scattered areas of mild cylindrical bronchiectasis in the lung ?bases bilaterally. ?- 12/15/2021 added flutter valve  ? ?Reviewed role of flutter valve to help mobilize secretions/ cxr showing no progression of dx at this point so no other changes needed  ? ?Rec return in 6 weeks with all meds in hand using a trust but verify approach to confirm accurate Medication  Reconciliation The principal here is that until we are certain that the  patients are doing what we've asked, it makes no sense to ask them to do more.  ? ?    ?  ? ?Each maintenance medication was reviewed in detail including emphasizing most importantly the difference between maintenance and prns and under what circumstances the prns are to be triggered using an action plan format where appropriate. ? ?Total time for H and P, chart review, counseling, reviewing technique for both  hfa/flutter device(s) and generating customized AVS unique to this office visit / same day charting = 31 min  ?     ?

## 2021-12-17 NOTE — Assessment & Plan Note (Addendum)
Quit smoking 1992 ?- Spirometry 06/18/11 FEV1 1.27 (52%) ratio 40  ?- Spirometry 12/29/2016  FEV1 0.78 (34%)  Ratio 37  No saba first  ?- 12/29/2016   trial of bevespi 2bid  ?- alpha one AT screen 12/29/16 >  MM ?- Allergy profile  12/29/16  >    IgE  16  RAST neg   ?- 11/12/2017  After extensive coaching inhaler device  effectiveness =    90% with dpi > try trelegy if insurance covers ?- 11/13/2018 d/c trelegy trial basis as not sure it's helping > worse sob so restarted bevespi ?- 11/17/2019    try breztri 2bid > does remember  ?- 12/03/2020  After extensive coaching inhaler device,  effectiveness =    90% > rechallenge with Breztri ?- 12/15/2021 trained on Flutter valve and rx pred x 6 days only  ? ? Group D in terms of symptom/risk and laba/lama/ICS  therefore appropriate rx at this point >>>  breztri 2 bid and approp saba  And prn prednisone to see to what extent any of his symptoms are reversible  ? ?Re SABA :  I spent extra time with pt today reviewing appropriate use of albuterol for prn use on exertion with the following points: ?1) saba is for relief of sob that does not improve by walking a slower pace or resting but rather if the pt does not improve after trying this first. ?2) If the pt is convinced, as many are, that saba helps recover from activity faster then it's easy to tell if this is the case by re-challenging : ie stop, take the inhaler, then p 5 minutes try the exact same activity (intensity of workload) that just caused the symptoms and see if they are substantially diminished or not after saba ?3) if there is an activity that reproducibly causes the symptoms, try the saba 15 min before the activity on alternate days  ? ?  ?

## 2021-12-19 ENCOUNTER — Ambulatory Visit (INDEPENDENT_AMBULATORY_CARE_PROVIDER_SITE_OTHER): Payer: PPO

## 2021-12-19 VITALS — BP 124/64 | HR 74 | Temp 98.0°F | Ht 66.0 in | Wt 177.6 lb

## 2021-12-19 DIAGNOSIS — Z0000 Encounter for general adult medical examination without abnormal findings: Secondary | ICD-10-CM

## 2021-12-19 NOTE — Progress Notes (Signed)
? ?Subjective:  ? Marc Flores is a 81 y.o. male who presents for Medicare Annual/Subsequent preventive examination. ? ?Review of Systems    ? ? ?   ?Objective:  ?  ?Today's Vitals  ? 12/19/21 1404  ?BP: 124/64  ?Pulse: 74  ?Temp: 98 ?F (36.7 ?C)  ?TempSrc: Oral  ?SpO2: 97%  ?Weight: 177 lb 9.6 oz (80.6 kg)  ?Height: '5\' 6"'$  (1.676 m)  ? ?Body mass index is 28.67 kg/m?. ? ? ?  12/19/2021  ?  2:18 PM 12/16/2020  ?  9:08 AM 12/18/2019  ? 12:06 PM  ?Advanced Directives  ?Does Patient Have a Medical Advance Directive? No Yes No  ?Type of Advance Directive  Living will;Healthcare Power of Attorney   ?Does patient want to make changes to medical advance directive?  No - Patient declined   ?Copy of [place] in Chart?  No - copy requested   ?Would patient like information on creating a medical advance directive? No - Patient declined  No - Patient declined  ? ? ?Current Medications (verified) ?Outpatient Encounter Medications as of 12/19/2021  ?Medication Sig  ? acetaminophen (TYLENOL) 500 MG tablet Take 500-1,000 mg by mouth every 6 (six) hours as needed for mild pain. (Patient not taking: Reported on 12/15/2021)  ? atorvastatin (LIPITOR) 10 MG tablet TAKE 1 TABLET BY MOUTH EVERY DAY (Patient taking differently: Take 10 mg by mouth daily after supper.)  ? Budeson-Glycopyrrol-Formoterol (BREZTRI AEROSPHERE) 160-9-4.8 MCG/ACT AERO Take 2 puffs first thing in am and then another 2 puffs about 12 hours later. (Patient taking differently: Inhale 2 puffs into the lungs in the morning and at bedtime.)  ? colchicine 0.6 MG tablet TAKE 2 TABLETS BY MOUTH TWICE DAILY AS NEEDED FOR GOUT FLARE  ? predniSONE (DELTASONE) 10 MG tablet Take  4 each am x 2 days,   2 each am x 2 days,  1 each am x 2 days and stop  ? PROAIR HFA 108 (90 Base) MCG/ACT inhaler Inhale 2 puffs into the lungs as directed. (Patient taking differently: Inhale 2 puffs into the lungs 3 (three) times daily.)  ? tamsulosin (FLOMAX) 0.4 MG CAPS  capsule Take 0.4 mg by mouth daily after supper.  ? ?Facility-Administered Encounter Medications as of 12/19/2021  ?Medication  ? 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 ? ?Allergies (verified) ?Patient has no known allergies.  ? ?History: ?Past Medical History:  ?Diagnosis Date  ? Arthritis   ? Bruises easily   ? Cancer [facility])   ? 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 COPD (chronic obstructive pulmonary disease) ([place])   ? sees Dr. Melvyn Novas    ? Diverticulitis   ? Dyspnea   ? ON EXERTION  ? History of chickenpox   ? Hyperlipidemia   ? ?Past Surgical History:  ?Procedure Laterality Date  ? COLONOSCOPY  04/13/2020  ? per Dr. Carol Ada, adenomaotus polyps, repeat in 7 yrs   ? CYSTOSCOPY  11/27/2019  ? IN [facility]  ? CYSTOSCOPY W/ RETROGRADES Bilateral 12/18/2019  ? Procedure: CYSTOSCOPY WITH RETROGRADE PYELOGRAM;  Surgeon: Franchot Gallo, MD;  Location: [facility];  Service: Urology;  Laterality: Bilateral;  ? TONSILLECTOMY  AS CHILD  ? TRANSURETHRAL RESECTION OF BLADDER TUMOR WITH MITOMYCIN-C N/A 12/18/2019  ? Procedure: TRANSURETHRAL RESECTION OF BLADDER TUMOR, RIGHT URETEROSCOPY AND RIGHT URETERAL STENT PLACEMENT;  Surgeon: Franchot Gallo, MD;  [facility];  Service: Urology;  Laterality: N/A;  ?  WISDOM TOOTH EXTRACTION  AS CHILD  ? ?Family History  ?Problem Relation Age of Onset  ? Glaucoma Mother   ? Diabetes Mother   ? Hearing loss Mother   ? Heart disease Mother   ? Heart disease Father   ? Atrial fibrillation Father   ? Arthritis Father   ? Cancer Father   ? Hearing loss Father   ? Hypertension Father   ? ?Social History  ? ?Socioeconomic History  ? Marital status: Married  ?  Spouse name: Not on file  ? Number of children: 2  ? Years of education: Not on file  ? Highest education level: Not on file  ?Occupational History  ? Occupation: security  ?Tobacco Use  ? Smoking status: Former  ?  Packs/day: 1.00  ?  Years: 30.00  ?  Pack  years: 30.00  ?  Types: Cigarettes  ?  Quit date: 12/08/1991  ?  Years since quitting: 30.0  ? Smokeless tobacco: Never  ?Vaping Use  ? Vaping Use: Never used  ?Substance and Sexual Activity  ? Alcohol use: Yes  ? Drug use: No  ? Sexual activity: Yes  ?Other Topics Concern  ? Not on file  ?Social History Narrative  ? Not on file  ? ?Social Determinants of Health  ? ?Financial Resource Strain: Low Risk   ? Difficulty of Paying Living Expenses: Not hard at all  ?Food Insecurity: No Food Insecurity  ? Worried About Charity fundraiser in the Last Year: Never true  ? Ran Out of Food in the Last Year: Never true  ?Transportation Needs: No Transportation Needs  ? Lack of Transportation (Medical): No  ? Lack of Transportation (Non-Medical): No  ?Physical Activity: Sufficiently Active  ? Days of Exercise per Week: 7 days  ? Minutes of Exercise per Session: 150+ min  ?Stress: No Stress Concern Present  ? Feeling of Stress : Not at all  ?Social Connections: Moderately Isolated  ? Frequency of Communication with Friends and Family: More than three times a week  ? Frequency of Social Gatherings with Friends and Family: More than three times a week  ? Attends Religious Services: Never  ? Active Member of Clubs or Organizations: No  ? Attends Archivist Meetings: Never  ? Marital Status: Married  ? ? ?Clinical Intake: ? ? ?Diabetic?  No ? ?Interpreter Needed?: No ?Activities of Daily Living ? ?  12/19/2021  ?  2:16 PM  ?In your present state of health, do you have any difficulty performing the following activities:  ?Hearing? 0  ?Vision? 0  ?Difficulty concentrating or making decisions? 0  ?Walking or climbing stairs? 0  ?Dressing or bathing? 0  ?Doing errands, shopping? 0  ?Preparing Food and eating ? N  ?Using the Toilet? N  ?In the past six months, have you accidently leaked urine? N  ?Do you have problems with loss of bowel control? N  ?Managing your Medications? N  ?Managing your Finances? N  ?Housekeeping or managing  your Housekeeping? N  ? ? ?Patient Care Team: ?Laurey Morale, MD as PCP - General (Family Medicine) ?Viona Gilmore, [facility] as Pharmacist (Pharmacist) ? ?Indicate any recent Medical Services you may have received from other than [facility] providers in the past year (date may be approximate). ? ?   ?Assessment:  ? This is a routine wellness examination for [place]. ? ?Hearing/Vision screen ?Hearing Screening - Comments:: No hearing difficulty ?Vision Screening - Comments:: Wears reading glasses. Followed by  Dr Valetta Close ? ?Dietary issues and exercise activities discussed: ?Exercise limited by: None identified ? ? Goals Addressed   ? ?  ?  ?  ?  ?  ? This Visit's Progress  ?   DIET - INCREASE WATER INTAKE (pt-stated)     ?   Increase exercise and lose weight ?  ? ?  ? ?Depression Screen ? ?  12/19/2021  ?  2:14 PM 05/12/2021  ? 10:04 AM 12/16/2020  ?  9:09 AM 12/16/2020  ?  9:05 AM  ?PHQ 2/9 Scores  ?PHQ - 2 Score 0 0 0 0  ?PHQ- 9 Score  0    ?  ?Fall Risk ? ?  12/19/2021  ?  2:17 PM 05/12/2021  ?  8:36 AM 12/16/2020  ?  9:08 AM  ?Fall Risk   ?Falls in the past year? 0 0 0  ?Number falls in past yr: 0 0 0  ?Injury with Fall? 0 0 0  ?Risk for fall due to :  No Fall Risks   ?Follow up   Falls evaluation completed  ? ? ?FALL RISK PREVENTION PERTAINING TO THE HOME: ? ?Any stairs in or around the home? Yes  ?If so, are there any without handrails? No  ?Home free of loose throw rugs in walkways, pet beds, electrical cords, etc? Yes  ?Adequate lighting in your home to reduce risk of falls? Yes  ? ?ASSISTIVE DEVICES UTILIZED TO PREVENT FALLS: ? ?Life alert? No  ?Use of a cane, walker or w/c? No  ?Grab bars in the bathroom? Yes  ?Shower chair or bench in shower? Yes  ?Elevated toilet seat or a handicapped toilet? No  ? ?TIMED UP AND GO: ? ?Was the test performed? Yes .  ?Length of time to ambulate 10 feet: 5 sec.  ? ?Gait steady and fast without use of assistive device ? ?Cognitive Function: ?  ?  ?Immunizations ?Immunization History   ?Administered Date(s) Administered  ? PFIZER(Purple Top)SARS-COV-2 Vaccination 10/10/2019, 10/31/2019, 06/30/2020  ? Pneumococcal Conjugate-13 12/07/2015  ? Pneumococcal Polysaccharide-23 12/08/2006, 09/04/2014  ? Tda

## 2021-12-19 NOTE — Patient Instructions (Signed)
?Mr. Luu , ?Thank you for taking time to come for your Medicare Wellness Visit. I appreciate your ongoing commitment to your health goals. Please review the following plan we discussed and let me know if I can assist you in the future.  ? ?These are the goals we discussed: ? Goals   ? ?  DIET - INCREASE WATER INTAKE   ? ?  ?  ?This is a list of the screening recommended for you and due dates:  ?Health Maintenance  ?Topic Date Due  ? Hepatitis C Screening: USPSTF Recommendation to screen - Ages 19-79 yo.  Never done  ? Zoster (Shingles) Vaccine (1 of 2) Never done  ? COVID-19 Vaccine (4 - Booster for [place] series) 08/25/2020  ? Flu Shot  04/04/2022  ? Tetanus Vaccine  04/21/2029  ? Pneumonia Vaccine  Completed  ? HPV Vaccine  Aged Out  ? ? ?Advanced directives: No Patient given information  ? ?Conditions/risks identified: None ? ?Next appointment: Follow up in one year for your annual wellness visit.  ? ?Preventive Care 21 Years and Older, Male ?Preventive care refers to lifestyle choices and visits with your health care provider that can promote health and wellness. ?What does preventive care include? ?A yearly physical exam. This is also called an annual well check. ?Dental exams once or twice a year. ?Routine eye exams. Ask your health care provider how often you should have your eyes checked. ?Personal lifestyle choices, including: ?Daily care of your teeth and gums. ?Regular physical activity. ?Eating a healthy diet. ?Avoiding tobacco and drug use. ?Limiting alcohol use. ?Practicing safe sex. ?Taking low doses of aspirin every day. ?Taking vitamin and mineral supplements as recommended by your health care provider. ?What happens during an annual well check? ?The services and screenings done by your health care provider during your annual well check will depend on your age, overall health, lifestyle risk factors, and family history of disease. ?Counseling  ?Your health care provider may ask you questions  about your: ?Alcohol use. ?Tobacco use. ?Drug use. ?Emotional well-being. ?Home and relationship well-being. ?Sexual activity. ?Eating habits. ?History of falls. ?Memory and ability to understand (cognition). ?Work and work Statistician. ?Screening  ?You may have the following tests or measurements: ?Height, weight, and BMI. ?Blood pressure. ?Lipid and cholesterol levels. These may be checked every 5 years, or more frequently if you are over 92 years old. ?Skin check. ?Lung cancer screening. You may have this screening every year starting at age 43 if you have a 30-pack-year history of smoking and currently smoke or have quit within the past 15 years. ?Fecal occult blood test (FOBT) of the stool. You may have this test every year starting at age 18. ?Flexible sigmoidoscopy or colonoscopy. You may have a sigmoidoscopy every 5 years or a colonoscopy every 10 years starting at age 29. ?Prostate cancer screening. Recommendations will vary depending on your family history and other risks. ?Hepatitis C blood test. ?Hepatitis B blood test. ?Sexually transmitted disease (STD) testing. ?Diabetes screening. This is done by checking your blood sugar (glucose) after you have not eaten for a while (fasting). You may have this done every 1-3 years. ?Abdominal aortic aneurysm (AAA) screening. You may need this if you are a current or former smoker. ?Osteoporosis. You may be screened starting at age 68 if you are at high risk. ?Talk with your health care provider about your test results, treatment options, and if necessary, the need for more tests. ?Vaccines  ?Your health care provider  may recommend certain vaccines, such as: ?Influenza vaccine. This is recommended every year. ?Tetanus, diphtheria, and acellular pertussis (Tdap, Td) vaccine. You may need a Td booster every 10 years. ?Zoster vaccine. You may need this after age 69. ?Pneumococcal 13-valent conjugate (PCV13) vaccine. One dose is recommended after age 44. ?Pneumococcal  polysaccharide (PPSV23) vaccine. One dose is recommended after age 78. ?Talk to your health care provider about which screenings and vaccines you need and how often you need them. ?This information is not intended to replace advice given to you by your health care provider. Make sure you discuss any questions you have with your health care provider. ?Document Released: 09/17/2015 Document Revised: 05/10/2016 Document Reviewed: 06/22/2015 ?Elsevier Interactive Patient Education ? 2017 [employer]. ? ?Fall [name] ?Falls can cause injuries. They can happen to people of all ages. There are many things you can do to make your home safe and to help prevent falls. ?What can I do on the outside of my home? ?Regularly fix the edges of walkways and driveways and fix any cracks. ?Remove anything that might make you trip as you walk through a door, such as a raised step or threshold. ?Trim any bushes or trees on the path to your home. ?Use bright outdoor lighting. ?Clear any walking paths of anything that might make someone trip, such as rocks or tools. ?Regularly check to see if handrails are loose or broken. Make sure that both sides of any steps have handrails. ?Any raised decks and porches should have guardrails on the edges. ?Have any leaves, snow, or ice cleared regularly. ?Use sand or salt on walking paths during winter. ?Clean up any spills in your garage right away. This includes oil or grease spills. ?What can I do in the bathroom? ?Use night lights. ?Install grab bars by the toilet and in the tub and shower. Do not use towel bars as grab bars. ?Use non-skid mats or decals in the tub or shower. ?If you need to sit down in the shower, use a plastic, non-slip stool. ?Keep the floor dry. Clean up any water that spills on the floor as soon as it happens. ?Remove soap buildup in the tub or shower regularly. ?Attach bath mats securely with double-sided non-slip rug tape. ?Do not have throw rugs and other  things on the floor that can make you trip. ?What can I do in the bedroom? ?Use night lights. ?Make sure that you have a light by your bed that is easy to reach. ?Do not use any sheets or blankets that are too big for your bed. They should not hang down onto the floor. ?Have a firm chair that has side arms. You can use this for support while you get dressed. ?Do not have throw rugs and other things on the floor that can make you trip. ?What can I do in the kitchen? ?Clean up any spills right away. ?Avoid walking on wet floors. ?Keep items that you use a lot in easy-to-reach places. ?If you need to reach something above you, use a strong step stool that has a grab bar. ?Keep electrical cords out of the way. ?Do not use floor polish or wax that makes floors slippery. If you must use wax, use non-skid floor wax. ?Do not have throw rugs and other things on the floor that can make you trip. ?What can I do with my stairs? ?Do not leave any items on the stairs. ?Make sure that there are handrails on both sides  of the stairs and use them. Fix handrails that are broken or loose. Make sure that handrails are as long as the stairways. ?Check any carpeting to make sure that it is firmly attached to the stairs. Fix any carpet that is loose or worn. ?Avoid having throw rugs at the top or bottom of the stairs. If you do have throw rugs, attach them to the floor with carpet tape. ?Make sure that you have a light switch at the top of the stairs and the bottom of the stairs. If you do not have them, ask someone to add them for you. ?What else can I do to help prevent falls? ?Wear shoes that: ?Do not have high heels. ?Have rubber bottoms. ?Are comfortable and fit you well. ?Are closed at the toe. Do not wear sandals. ?If you use a stepladder: ?Make sure that it is fully opened. Do not climb a closed stepladder. ?Make sure that both sides of the stepladder are locked into place. ?Ask someone to hold it for you, if possible. ?Clearly  mark and make sure that you can see: ?Any grab bars or handrails. ?First and last steps. ?Where the edge of each step is. ?Use tools that help you move around (mobility aids) if they are needed. These includ

## 2022-01-05 ENCOUNTER — Other Ambulatory Visit: Payer: Self-pay

## 2022-01-05 ENCOUNTER — Encounter: Payer: Self-pay | Admitting: Internal Medicine

## 2022-01-05 MED ORDER — BREZTRI AEROSPHERE 160-9-4.8 MCG/ACT IN AERO: INHALATION_SPRAY | RESPIRATORY_TRACT | 6 refills | Status: DC

## 2022-01-05 NOTE — Telephone Encounter (Signed)
Patient called in and states he has been waiting for a week to get Breztri refill. [facility] pharmacy in [place]. Call back number is [phone]- please call once refill is sent in. ? ?Please advise. ?

## 2022-01-07 IMAGING — DX DG CHEST 2V
2 series · 2 of 2 positions shown · non-contrast
Comparison: 12/18/2016 chest radiograph.

CLINICAL DATA: Productive cough, dyspnea, COPD

EXAM:
CHEST - 2 VIEW

[chest pa]
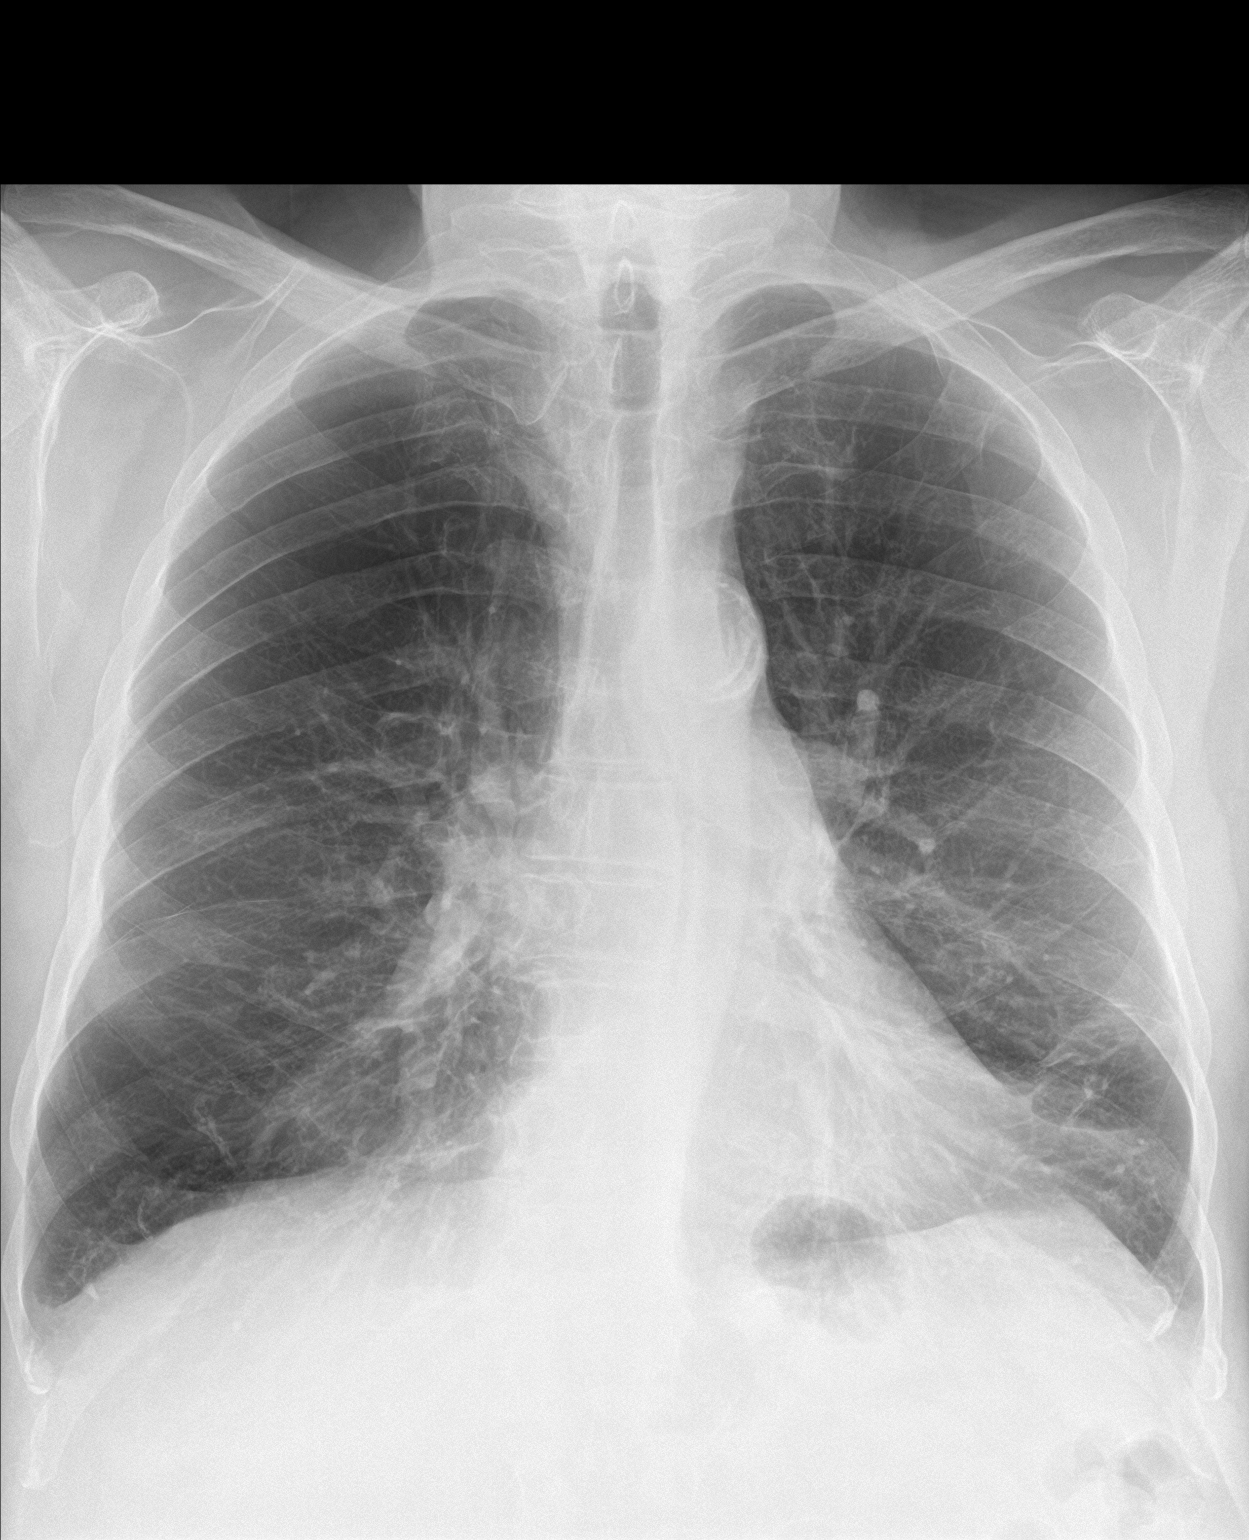

[chest lat]
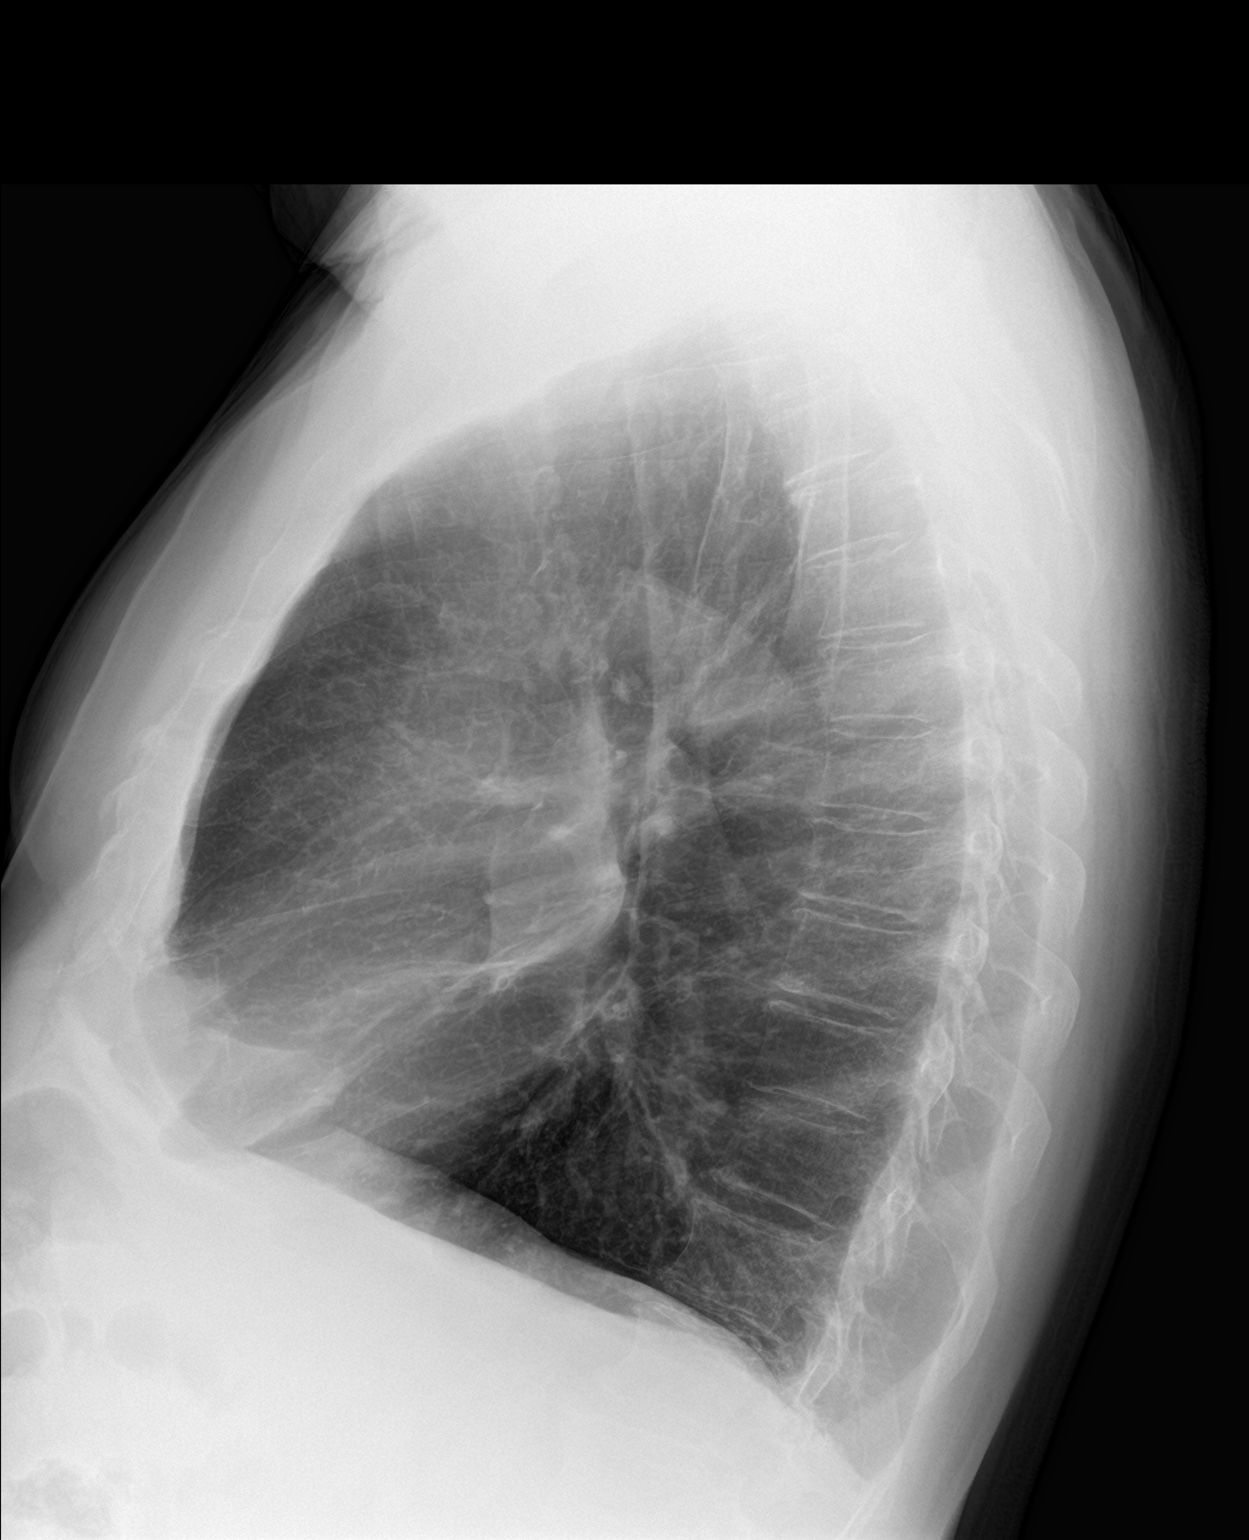

[2 of 2 positions shown; findings below may reference images not displayed]

FINDINGS: Stable cardiomediastinal silhouette with normal heart size. No
pneumothorax. No pleural effusion. Hyperinflated lungs. Emphysema.
No pulmonary edema. No acute consolidative airspace disease.
IMPRESSION: 1. No acute cardiopulmonary disease.
2. Hyperinflated lungs and emphysema, compatible with reported
history of COPD.

## 2022-02-06 ENCOUNTER — Encounter: Payer: Self-pay | Admitting: Internal Medicine

## 2022-02-06 ENCOUNTER — Ambulatory Visit: Payer: PPO | Admitting: Internal Medicine

## 2022-02-06 ENCOUNTER — Ambulatory Visit (INDEPENDENT_AMBULATORY_CARE_PROVIDER_SITE_OTHER): Payer: PPO

## 2022-02-06 DIAGNOSIS — J449 Chronic obstructive pulmonary disease, unspecified: Secondary | ICD-10-CM

## 2022-02-06 DIAGNOSIS — R0600 Dyspnea, unspecified: Secondary | ICD-10-CM | POA: Diagnosis not present

## 2022-02-06 DIAGNOSIS — R0609 Other forms of dyspnea: Secondary | ICD-10-CM

## 2022-02-06 LAB — D-DIMER, QUANTITATIVE: D-Dimer, Quant: 0.41 mcg/mL FEU (ref <0.50)

## 2022-02-06 MED ORDER — PANTOPRAZOLE SODIUM 40 MG PO TBEC: 40.0000 mg | DELAYED_RELEASE_TABLET | Freq: Every day | ORAL | 2 refills | Status: DC

## 2022-02-06 MED ORDER — FAMOTIDINE 20 MG PO TABS: ORAL_TABLET | ORAL | 11 refills | Status: DC

## 2022-02-06 MED ORDER — PREDNISONE 10 MG PO TABS: ORAL_TABLET | ORAL | 0 refills | Status: DC

## 2022-02-06 NOTE — Patient Instructions (Addendum)
Ok to try albuterol 15 min before an activity (on alternating days)  that you know would usually make you short of breath and see if it makes any difference and if makes none then don't take albuterol after activity unless you can't catch your breath as this means it's the resting that helps, not the albuterol.   Work on inhaler technique:  relax and gently blow all the way out then take a nice smooth full deep breath back in, triggering the inhaler at same time you start breathing in.  Hold for up to 5 seconds if you can. Blow Breztri out thru nose. Rinse and gargle with water when done.  If mouth or throat bother you at all,  try brushing teeth/gums/tongue with arm and hammer toothpaste/ make a slurry and gargle and spit out.   Pantoprazole (protonix) 40 mg   Take  30-60 min before first meal of the day and Pepcid (famotidine)  20 mg after supper until return to office - this is the best way to tell whether stomach acid is contributing to your problem.     GERD (REFLUX)  is an extremely common cause of respiratory symptoms just like yours , many times with no obvious heartburn at all.    It can be treated with medication, but also with lifestyle changes including elevation of the head of your bed (ideally with 6 -8inch blocks under the headboard of your bed),  Smoking cessation, avoidance of late meals, excessive alcohol, and avoid fatty foods, chocolate, peppermint, colas, red wine, and acidic juices such as orange juice.  NO MINT OR MENTHOL PRODUCTS SO NO COUGH DROPS  USE SUGARLESS CANDY INSTEAD (Jolley ranchers or Stover's or Life Savers) or even ice chips will also do - the key is to swallow to prevent all throat clearing. NO OIL BASED VITAMINS - use powdered substitutes.  Avoid fish oil when coughing.   Make sure you check your oxygen saturation at your highest level of activity to be sure it stays over 90% and keep track of it at least once a week, more often if breathing getting worse, and  let me know if losing ground.   Please schedule a follow up office visit in 6 weeks, call sooner if needed Needs repeat walk on RA and recheck cbc for high hgb

## 2022-02-06 NOTE — Progress Notes (Unsigned)
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 rec If not revealing will consider going ahead with a cpst - we will call to schedule Try prilosec '20mg'$   Take 30-60 min before first meal of the day and Pepcid 20 mg one bedtime until  We complete your work up GERD diet     12/15/2021  f/u ov/Delesha Pohlman re: GOLD 3/bronchiectasis   maint on breztri   Chief Complaint  Patient presents with   Follow-up    Follow up. Patient is having trouble breathing.   Dyspnea:  mb and back about stops half way does not prechallenge as rec with saba  Cough: day > night beige x 15 secs each am  - more if mows grass (riding)   Sleeping: flat bed 2 pillows  SABA use: 3-4 x daily  02: none  Covid status:   vax x 3  Rec For cough  > Mucinex dm 1200 mg every 12 hours as needed and try flutter valve as needed  Ok to try albuterol 15 min before an activity (on alternating days)  that you know would usually make you short of breath Prednisone 10 mg take  4 each am x 2 days,   2 each am x 2 days,  1 each am x 2 days and stop      02/06/2022  f/u ov/Alivia Cimino re: GOLD 3 copd/ bronchiectasis maint  Chief Complaint  Patient presents with   Follow-up    DOE progressively worse since last visit. He is getting winded walking approx 50 ft. He noticed  some wheezing a few days ago and is using his albuterol 5-6 x per day with relief for only about 30 min.   Dyspnea:  no longer mb - shed is slt downhill maybe 150 ft and can back without stopping  Cough: much better since prednisone and no worse off it  Sleeping: flat bed / 2 pillows no resp cc   SABA use: not better  02: none      No obvious day to day or daytime variability or assoc excess/ purulent sputum or mucus plugs or hemoptysis or cp or    or overt sinus or hb symptoms.    Also denies any obvious fluctuation of symptoms with weather or environmental changes or other aggravating or alleviating factors except as outlined above   No unusual exposure hx or h/o childhood pna/ asthma or knowledge of premature birth.  Current Allergies, Complete Past Medical History, Past Surgical History, Family History, and Social History were reviewed in [employer] record.  ROS  The following are not active complaints unless bolded Hoarseness, sore throat/globus, dysphagia, dental problems, itching, sneezing,  nasal congestion or discharge of excess mucus or purulent secretions, ear ache,   fever, chills, sweats, unintended wt loss or wt gain, classically pleuritic or exertional cp,  orthopnea pnd or arm/hand swelling  or leg swelling, presyncope, palpitations, abdominal pain, anorexia, nausea, vomiting, diarrhea  or change in bowel habits or change in bladder habits, change in stools or change in urine, dysuria, hematuria,  rash, arthralgias, visual complaints, headache, numbness, weakness or ataxia or problems with walking or coordination,  change in mood or  memory.        Current Meds  Medication Sig   acetaminophen (TYLENOL) 500 MG tablet Take 500-1,000 mg by mouth every 6 (six) hours as needed for mild pain.   atorvastatin (LIPITOR) 10 MG tablet TAKE 1 TABLET BY MOUTH EVERY DAY (Patient taking differently: Take 10 mg by mouth daily after supper.)    Budeson-Glycopyrrol-Formoterol (BREZTRI AEROSPHERE) 160-9-4.8 MCG/ACT AERO 2 puffs in morning 2 puffs about 12 hours later   colchicine 0.6 MG tablet TAKE 2 TABLETS BY MOUTH TWICE DAILY AS NEEDED FOR GOUT FLARE   PROAIR HFA 108 (90 Base) MCG/ACT inhaler Inhale 2 puffs into the lungs as directed. (Patient taking differently: Inhale 2 puffs into the lungs 3 (three) times daily.)   tamsulosin (FLOMAX) 0.4 MG CAPS capsule Take 0.4 mg by mouth daily after supper.                      Objective:   Physical Exam   Wts  02/06/2022          167   12/15/2021        175  12/03/2020          178  11/17/2019        183 11/13/2018        186  .11/12/2017       178   01/26/2017       176  12/29/16 177 lb (80.3 kg)  12/20/16 176 lb 6.4 oz (80 kg)  12/08/11 179 lb (81.2 kg)    Vital signs reviewed  02/06/2022  - Note at rest 02 sats  93% on RA   General appearance:    somber amb wm nad   HEENT :  Oropharynx  clear  Nasal turbinates nl    NECK :  without JVD/Nodes/TM/ nl carotid upstrokes bilaterally   LUNGS: no acc muscle use,  Mod barrel  contour chest wall with bilateral  mid exp rhonchi better with PLM  and  without cough on insp or exp maneuvers and mod  Hyperresonant  to  percussion bilaterally    CV:  RRR  no s3 or murmur or increase in P2, and no edema   ABD:  soft and nontender with pos mid insp Hoover's  in the supine position. No bruits or organomegaly appreciated, bowel sounds nl  MS:   Ext warm without deformities or   obvious joint restrictions , calf tenderness, cyanosis or clubbing  SKIN: warm and dry without lesions    NEURO:  alert, approp, nl sensorium with  no motor or cerebellar deficits apparent.     CXR PA and Lateral:   02/06/2022 :    I personally reviewed images and agree with radiology impression as follows:   Similar appearance of the chest x-ray to 12/15/2021, with background chronic changes/emphysema, and no evidence of acute cardiopulmonary disease.   Labs  ordered/ reviewed:      Chemistry      Component  Value Date/Time   NA 140 02/06/2022 1535   K 4.7 02/06/2022 1535   CL 101 02/06/2022 1535   CO2 30 02/06/2022 1535   BUN 16 02/06/2022 1535   CREATININE 1.46 02/06/2022 1535      Component Value Date/Time   CALCIUM 9.9 02/06/2022 1535   ALKPHOS 93 05/12/2021 0858   AST 18 05/12/2021 0858   ALT 19 05/12/2021 0858   BILITOT 1.2 05/12/2021 0858        Lab Results  Component Value Date   WBC 8.4 02/06/2022   HGB 17.3 (H) 02/06/2022   HCT 53.4 (H) 02/06/2022   MCV 91.7 02/06/2022   PLT 226.0 02/06/2022       EOS                                                             0.4                                      02/06/2022   Lab Results  Component Value Date   DDIMER 0.41 02/06/2022      Lab Results  Component Value Date   TSH 4.48 02/06/2022     Lab Results  Component Value Date   PROBNP 29.0 02/06/2022       Lab Results  Component Value Date   ESRSEDRATE 3 02/06/2022                Assessment & Plan:

## 2022-02-07 LAB — CBC WITH DIFFERENTIAL/PLATELET
Basophils Absolute: 0.1 10*3/uL (ref 0.0–0.1)
Basophils Relative: 1.2 % (ref 0.0–3.0)
Eosinophils Absolute: 0.4 10*3/uL (ref 0.0–0.7)
Eosinophils Relative: 4.6 % (ref 0.0–5.0)
HCT: 53.4 % — ABNORMAL HIGH (ref 39.0–52.0)
Hemoglobin: 17.3 g/dL — ABNORMAL HIGH (ref 13.0–17.0)
Lymphocytes Relative: 18.3 % (ref 12.0–46.0)
Lymphs Abs: 1.5 10*3/uL (ref 0.7–4.0)
MCHC: 32.4 g/dL (ref 30.0–36.0)
MCV: 91.7 fl (ref 78.0–100.0)
Monocytes Absolute: 0.9 10*3/uL (ref 0.1–1.0)
Monocytes Relative: 11.3 % (ref 3.0–12.0)
Neutro Abs: 5.4 10*3/uL (ref 1.4–7.7)
Neutrophils Relative %: 64.6 % (ref 43.0–77.0)
Platelets: 226 10*3/uL (ref 150.0–400.0)
RBC: 5.82 Mil/uL — ABNORMAL HIGH (ref 4.22–5.81)
RDW: 14.3 % (ref 11.5–15.5)
WBC: 8.4 10*3/uL (ref 4.0–10.5)

## 2022-02-07 LAB — BASIC METABOLIC PANEL
BUN: 16 mg/dL (ref 6–23)
CO2: 30 mEq/L (ref 19–32)
Calcium: 9.9 mg/dL (ref 8.4–10.5)
Chloride: 101 mEq/L (ref 96–112)
Creatinine, Ser: 1.46 mg/dL (ref 0.40–1.50)
GFR: 45.54 mL/min — ABNORMAL LOW (ref >60.00)
Glucose, Bld: 90 mg/dL (ref 70–99)
Potassium: 4.7 mEq/L (ref 3.5–5.1)
Sodium: 140 mEq/L (ref 135–145)

## 2022-02-07 LAB — BRAIN NATRIURETIC PEPTIDE: Pro B Natriuretic peptide (BNP): 29 pg/mL (ref 0.0–100.0)

## 2022-02-07 LAB — TSH: TSH: 4.48 u[IU]/mL (ref 0.35–5.50)

## 2022-02-07 LAB — SEDIMENTATION RATE: Sed Rate: 3 mm/hr (ref 0–20)

## 2022-02-08 ENCOUNTER — Encounter: Payer: Self-pay | Admitting: Internal Medicine

## 2022-02-08 NOTE — Assessment & Plan Note (Signed)
-   12/08/2011  Walked RA x 3 laps @ 185 ft each stopped due to end of study, though sats dropped to 89% - Allergy profile  12/29/16  >   IgE  16 neg RAST - 02/06/2022   Walked on RA  x  2  lap(s) =  approx 500  ft  @ avg pace, stopped due to sob  with lowest 02 sats 85%    Pt declined 02 so needs to pace at a lower level and monitor sats with goal of keeping > 90% or accepting portable 02 esp noting hgb trending up   F/u  6 weeks with repeat cbc and walking sats, consider ONO RA as well.   Each maintenance medication was reviewed in detail including emphasizing most importantly the difference between maintenance and prns and under what circumstances the prns are to be triggered using an action plan format where appropriate.  Total time for H and P, chart review, counseling, reviewing hfa device(s) , directly observing portions of ambulatory 02 saturation study/ and generating customized AVS unique to this office visit / same day charting  > 30 min for severe  refractory respiratory  symptoms of uncertain etiology

## 2022-02-08 NOTE — Assessment & Plan Note (Addendum)
Quit smoking 1992?MM - Spirometry 06/18/11 FEV1 1.27 (52%) ratio 40  - Spirometry 12/29/2016  FEV1 0.78 (34%)  Ratio 37  No saba first  - 12/29/2016   trial of bevespi 2bid  - alpha one AT screen 12/29/16 >  MM - Allergy profile  12/29/16  >    IgE  16  RAST neg   - 11/12/2017  After extensive coaching inhaler device  effectiveness =    90% with dpi > try trelegy if insurance covers - 11/13/2018 d/c trelegy trial basis as not sure it's helping > worse sob so restarted bevespi - 11/17/2019    try breztri 2bid > does remember  - 12/03/2020   rechallenge with Judithann Sauger - 12/15/2021 trained on Flutter valve  - 02/06/2022  After extensive coaching inhaler device,  effectiveness =    75% from a baseline of 50% (short Ti) - Spirometry 02/06/2022  FEV1 0.8 (35%)  Ratio 0.44 p breztri with classic concave f/v    DDX of  difficult airways management almost all start with A and  include Adherence, Ace Inhibitors, Acid Reflux, Active Sinus Disease, Alpha 1 Antitripsin deficiency, Anxiety masquerading as Airways dz,  ABPA,  Allergy(esp in young), Aspiration (esp in elderly), Adverse effects of meds,  Active smoking or vaping, A bunch of PE's (a small clot burden can't cause this syndrome unless there is already severe underlying pulm or vascular dz with poor reserve) plus two Bs  = Bronchiectasis and Beta blocker use..and one C= CHF  Adherence is always the initial "prime suspect" and is a multilayered concern that requires a "trust but verify" approach in every patient - starting with knowing how to use medications, especially inhalers, correctly, keeping up with refills and understanding the fundamental difference between maintenance and prns vs those medications only taken for a very short course and then stopped and not refilled.  - see hfa teaching  - return with all meds in hand using a trust but verify approach to confirm accurate Medication  Reconciliation The principal here is that until we are certain that the   patients are doing what we've asked, it makes no sense to ask them to do more.   ? Acid (or non-acid) GERD > always difficult to exclude as up to 75% of pts in some series report no assoc GI/ Heartburn symptoms> rec max (24h)  acid suppression and diet restrictions/ reviewed and instructions given in writing.   ? Allergy/ asthma > continue high dose ICS   - Re SABA :  I spent extra time with pt today reviewing appropriate use of albuterol for prn use on exertion with the following points: 1) saba is for relief of sob that does not improve by walking a slower pace or resting but rather if the pt does not improve after trying this first. 2) If the pt is convinced, as many are, that saba helps recover from activity faster then it's easy to tell if this is the case by re-challenging : ie stop, take the inhaler, then p 5 minutes try the exact same activity (intensity of workload) that just caused the symptoms and see if they are substantially diminished or not after saba 3) if there is an activity that reproducibly causes the symptoms, try the saba 15 min before the activity on alternate days   If in fact the saba really does help, then fine to continue to use it prn but advised may need to look closer at the maintenance regimen being used  to achieve better control of airways disease with exertion.    ? Anxiety/depression/deconditioning  > usually at the bottom of this list of usual suspects bu  may interfere with adherence and also interpretation of response or lack thereof to symptom management which can be quite subjective.    ? A bunch of PEs >> D dimer nl - while a normal  or high normal value (seen commonly in the elderly or chronically ill)  may miss small peripheral pe, the clot burden with sob is moderately high and the d dimer  has a very high neg pred value if used in this setting.    ? chf > nl bnp, ht size on cxr rules out for all intents and purposes

## 2022-03-14 ENCOUNTER — Other Ambulatory Visit: Payer: Self-pay | Admitting: Family Medicine

## 2022-03-24 ENCOUNTER — Telehealth: Payer: Self-pay | Admitting: Pharmacist

## 2022-03-24 NOTE — Chronic Care Management (AMB) (Unsigned)
Chronic Care Management Pharmacy Assistant   Name: Marc Flores  MRN: [id number] DOB: [birth year]  Reason for Encounter: Disease State   Conditions to be addressed/monitored: General Assessment  Recent office visits:  12/19/21 Marc Peaches, LPN - Patient presented for [facility] Annual Wellness exam. Patient voiced goal of increased exercise and weight loss.  Recent consult visits:  02/06/22 Marc Rockers, MD (Pulmonology) - Patient presented for COPD Gold III and other concerns. Prescribed Famotidine. Prescribed Pantoprazole. Changed Prednisone.  12/14/21 Marc Rockers, MD (Pulmonology) - Patient presented for COPD Gold III and other concerns. Prescribed Prednisone.  Hospital visits:  None in previous 6 months  Medications: Outpatient Encounter Medications as of 03/24/2022  Medication Sig   acetaminophen (TYLENOL) 500 MG tablet Take 500-1,000 mg by mouth every 6 (six) hours as needed for mild pain.   atorvastatin (LIPITOR) 10 MG tablet TAKE 1 TABLET BY MOUTH EVERY DAY   Budeson-Glycopyrrol-Formoterol (BREZTRI AEROSPHERE) 160-9-4.8 MCG/ACT AERO 2 puffs in morning 2 puffs about 12 hours later   colchicine 0.6 MG tablet TAKE 2 TABLETS BY MOUTH TWICE DAILY AS NEEDED FOR GOUT FLARE   famotidine (PEPCID) 20 MG tablet One after supper   pantoprazole (PROTONIX) 40 MG tablet Take 1 tablet (40 mg total) by mouth daily. Take 30-60 min before first meal of the day   predniSONE (DELTASONE) 10 MG tablet Take 4 for three days 3 for three days 2 for three days 1 for three days and stop   PROAIR HFA 108 (90 Base) MCG/ACT inhaler Inhale 2 puffs into the lungs as directed. (Patient taking differently: Inhale 2 puffs into the lungs 3 (three) times daily.)   tamsulosin (FLOMAX) 0.4 MG CAPS capsule Take 0.4 mg by mouth daily after supper.   Facility-Administered Encounter Medications as of 03/24/2022  Medication   gemcitabine (GEMZAR) chemo syringe for bladder instillation 2,000 mg    Contacted Marc Flores for General Review Call  Adherence Review:  Does the Clinical Pharmacist Assistant have access to adherence rates? Yes Adherence rates for STAR metric medications  Atorvastatin  10 mg - Last filled 03/15/22 90 DS at [facility] Atorvastatin  10 mg - Last filled 12/14/21 90 DS at [facility] Does the patient have >5 day gap between last estimated fill dates for any of the above medications or other medication gaps? No   Disease State Questions:  Able to connect with Patient? {yes/no:20286} Did patient have any problems with their health recently? {yes/no:20286} Note problems and Concerns: Have you had any admissions or emergency room visits or worsening of your condition(s) since last visit? {yes/no:20286} Details of ED visit, hospital visit and/or worsening condition(s): Have you had any visits with new specialists or providers since your last visit? {yes/no:20286} Explain: Have you had any new health care problem(s) since your last visit? {yes/no:20286} New problem(s) reported: Have you run out of any of your medications since you last spoke with clinical pharmacist? {yes/no:20286} What caused you to run out of your medications? Are there any medications you are not taking as prescribed? {yes/no:20286} What kept you from taking your medications as prescribed? Are you having any issues or side effects with your medications? {yes/no:20286} Note of issues or side effects: Do you have any other health concerns or questions you want to discuss with your Clinical Pharmacist before your next visit? {yes/no:20286} Note additional concerns and questions from Patient. Are there any health concerns that you feel we can do a better job addressing? {yes/no:20286} Note Patient's  response. Are you having any problems with any of the following since the last visit: (select all that apply)  {General Call:27390}  Details: 12. Any falls since last visit?  {yes/no:20286}  Details: 13. Any increased or uncontrolled pain since last visit? {yes/no:20286}  Details: 14. Next visit Type: {Telephone/Office:25179}       Visit with:        Date:        Time:  51. Additional Details? {yes/no:20286}    Marc Flores  Care Gaps: Hepatitis C Screening - Overdue Zoster Vaccine - Overdue COVID Booster - Overdue CCM- AWV- 12/19/21  Star Rating Drugs: Atorvastatin  10 mg - Last filled 03/16/22 90 DS at [place] Verified as accurate    Marc Flores Pharmacist Assistant [phone]

## 2022-03-27 ENCOUNTER — Encounter: Payer: Self-pay | Admitting: Internal Medicine

## 2022-03-27 ENCOUNTER — Ambulatory Visit: Payer: PPO | Admitting: Internal Medicine

## 2022-03-27 DIAGNOSIS — J479 Bronchiectasis, uncomplicated: Secondary | ICD-10-CM

## 2022-03-27 DIAGNOSIS — J449 Chronic obstructive pulmonary disease, unspecified: Secondary | ICD-10-CM

## 2022-03-27 MED ORDER — PROAIR HFA 108 (90 BASE) MCG/ACT IN AERS: 2.0000 | INHALATION_SPRAY | RESPIRATORY_TRACT | 11 refills | Status: DC | PRN

## 2022-03-27 NOTE — Progress Notes (Signed)
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 rec If not revealing will consider going ahead with a cpst - we will call to schedule Try prilosec '20mg'$   Take 30-60 min before first meal of the day and Pepcid 20 mg one bedtime until  We complete your work up GERD diet     12/15/2021  f/u ov/Lurlie Wigen re: GOLD 3/bronchiectasis   maint on breztri   Chief Complaint  Patient presents with   Follow-up    Follow up. Patient is having trouble breathing.   Dyspnea:  mb and back about stops half way does not prechallenge as rec with saba  Cough: day > night beige x 15 secs each am  - more if mows grass (riding)   Sleeping: flat bed 2 pillows  SABA use: 3-4 x daily  02: none  Covid status:   vax x 3  Rec For cough  > Mucinex dm 1200 mg every 12 hours as needed and try flutter valve as needed  Ok to try albuterol 15 min before an activity (on alternating days)  that you know would usually make you short of breath Prednisone 10 mg take  4 each am x 2 days,   2 each am x 2 days,  1 each am x 2 days and stop      02/06/2022  f/u ov/Dariel Betzer re: GOLD 3 copd/ bronchiectasis   Chief Complaint  Patient presents with   Follow-up    DOE progressively worse since last visit. He is getting winded walking approx 50 ft. He noticed some  wheezing a few days ago and is using his albuterol 5-6 x per day with relief for only about 30 min.   Dyspnea:  no longer mb - shed is slt downhill maybe 150 ft and can back without stopping  Cough: much better since prednisone and no worse off it  Sleeping: flat bed / 2 pillows no resp cc   SABA use: not better  02: none  Rec Ok to try albuterol 15 min before an activity (on alternating days)  that you know would usually make you short of breath Work on inhaler technique:   Pantoprazole (protonix) 40 mg   Take  30-60 min before first meal of the day and Pepcid (famotidine)  20 mg after supper until return to office  GERD diet  Make sure you check your oxygen saturation at your highest level of activity to be sure it stays over 90% Please schedule a follow up office visit in 6 weeks, call sooner if needed Needs repeat walk on RA and recheck cbc for high hgb    03/27/2022  f/u ov/Jaislyn Blinn re: GOLD 3 /bronchiectasis  maint on breztri   Chief Complaint  Patient presents with   Follow-up    Pt states much better since  LOV.  Dyspnea:  improved  to mb and shed s stopping with sats low 90s now Cough: better on gerd rx Sleeping: flat bed 2 pillows  SABA use: 4 x daily  just with activity, really feels it helps   02: none      No obvious day to day or daytime variability or assoc excess/ purulent sputum or mucus plugs or hemoptysis or cp or chest tightness, subjective wheeze or overt sinus or hb symptoms.   Sleeping  without nocturnal  or early am exacerbation  of respiratory  c/o's or need for noct saba. Also denies any obvious fluctuation of symptoms with weather or environmental changes or other aggravating or alleviating factors except as outlined above   No unusual exposure hx or h/o childhood pna/ asthma or knowledge of premature birth.  Current Allergies, Complete Past Medical History, Past Surgical History, Family History, and Social History were reviewed in [employer] record.  ROS  The following are not active complaints unless bolded Hoarseness, sore throat, dysphagia, dental problems, itching, sneezing,  nasal congestion or discharge of excess mucus or purulent secretions, ear ache,   fever, chills, sweats, unintended wt loss or wt gain, classically pleuritic or exertional cp,  orthopnea pnd or arm/hand swelling  or leg swelling, presyncope, palpitations, abdominal pain, anorexia, nausea, vomiting, diarrhea  or change in bowel habits or change in bladder habits, change in stools or change in urine, dysuria, hematuria,  rash, arthralgias, visual complaints, headache, numbness, weakness or ataxia or problems with walking or coordination,  change in mood or  memory.        Current Meds  Medication Sig   acetaminophen (TYLENOL) 500 MG tablet Take 500-1,000 mg by mouth every 6 (six) hours as needed for mild pain.   atorvastatin (LIPITOR) 10 MG tablet TAKE 1 TABLET BY MOUTH EVERY DAY   Budeson-Glycopyrrol-Formoterol (BREZTRI AEROSPHERE) 160-9-4.8 MCG/ACT AERO 2 puffs in morning 2 puffs about 12 hours later   colchicine 0.6 MG tablet TAKE 2 TABLETS BY MOUTH TWICE DAILY AS NEEDED FOR GOUT FLARE   famotidine (PEPCID) 20 MG tablet One after supper   pantoprazole (PROTONIX) 40 MG tablet Take 1 tablet (40 mg total) by mouth daily. Take 30-60 min before first meal of the day   predniSONE (DELTASONE) 10 MG tablet Take 4 for three days 3 for three days 2 for three days 1 for three days and stop   PROAIR HFA 108 (90 Base) MCG/ACT inhaler Inhale 2 puffs into the lungs as directed. (Patient taking differently: Inhale 2 puffs into the lungs 3 (three) times daily.)   tamsulosin (FLOMAX) 0.4 MG CAPS capsule Take 0.4 mg by mouth daily after supper.                       Objective:   Physical Exam   Wts 03/27/2022        171   02/06/2022          167   12/15/2021        175  12/03/2020          178  11/17/2019        183 11/13/2018        186  .11/12/2017       178    01/26/2017       176  12/29/16 177 lb (80.3 kg)  12/20/16 176 lb 6.4 oz (80 kg)  12/08/11 179 lb (81.2 kg)     Vital signs reviewed  03/27/2022  - Note at rest 02 sats  92% on RA   General appearance:    amb wm all smiles    HEENT :  Oropharynx  clear  Nasal turbinates nl    NECK :  without JVD/Nodes/TM/ nl carotid upstrokes bilaterally   LUNGS: no acc muscle use,  Mod barrel  contour chest wall with bilateral  Distant bs s audible wheeze and  without cough on insp or exp maneuvers and mod  Hyperresonant  to  percussion bilaterally     CV:  RRR  no s3 or murmur or increase in P2, and no edema   ABD:  soft and nontender with pos mid insp Hoover's  in the supine position. No bruits or organomegaly appreciated, bowel sounds nl  MS:   Ext warm without deformities or   obvious joint restrictions , calf tenderness, cyanosis or clubbing  SKIN: warm and dry without lesions    NEURO:  alert, approp, nl sensorium with  no motor or cerebellar deficits apparent.               Assessment & Plan:

## 2022-03-27 NOTE — Patient Instructions (Signed)
No change in recommendations  Please schedule a follow up visit in 3 months but call sooner if needed  

## 2022-03-27 NOTE — Assessment & Plan Note (Signed)
Quit smoking 1992?MM - Spirometry 06/18/11 FEV1 1.27 (52%) ratio 40  - Spirometry 12/29/2016  FEV1 0.78 (34%)  Ratio 37  No saba first  - 12/29/2016   trial of bevespi 2bid  - alpha one AT screen 12/29/16 >  MM - Allergy profile  12/29/16  >    IgE  16  RAST neg   - 11/12/2017  After extensive coaching inhaler device  effectiveness =    90% with dpi > try trelegy if insurance covers - 11/13/2018 d/c trelegy trial basis as not sure it's helping > worse sob so restarted bevespi - 11/17/2019    try breztri 2bid > does remember  - 12/03/2020  After extensive coaching inhaler device,  effectiveness =    90% > rechallenge with Breztri - 12/15/2021 trained on Flutter valve and rx pred x 6 days only  - Spirometry 02/06/2022  FEV1 0.8 (35%)  Ratio 0.44 p breztri with classic concave f/v     Group D (now reclassified as E) in terms of symptom/risk and laba/lama/ICS  therefore appropriate rx at this point >>>  breztri plus more approp saba:  Re SABA :  I spent extra time with pt today reviewing appropriate use of albuterol for prn use on exertion with the following points: 1) saba is for relief of sob that does not improve by walking a slower pace or resting but rather if the pt does not improve after trying this first. 2) If the pt is convinced, as many are, that saba helps recover from activity faster then it's easy to tell if this is the case by re-challenging : ie stop, take the inhaler, then p 5 minutes try the exact same activity (intensity of workload) that just caused the symptoms and see if they are substantially diminished or not after saba 3) if there is an activity that reproducibly causes the symptoms, try the saba 15 min before the activity on alternate days   If in fact the saba really does help, then fine to continue to use it prn but advised may need to look closer at the maintenance regimen being used to achieve better control of airways disease with exertion.

## 2022-03-27 NOTE — Assessment & Plan Note (Signed)
CT chest 12/28/16 1. No evidence of interstitial lung disease. 2. Diffuse bronchial wall thickening with moderate centrilobular and mild paraseptal emphysema; imaging findings compatible with the reported clinical history of COPD. 3. 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 - 12/15/2021 added flutter valve  - 02/06/22 added gerd rx > much improved   Marked clinical improvement strongly suggests gerd playing a role here > continue Rx / advised   F/u q 3 m sooner prn           Each maintenance medication was reviewed in detail including emphasizing most importantly the difference between maintenance and prns and under what circumstances the prns are to be triggered using an action plan format where appropriate.  Total time for H and P, chart review, counseling, reviewing hfa device(s) and generating customized AVS unique to this office visit / same day charting = 25 min

## 2022-04-04 ENCOUNTER — Telehealth: Payer: Self-pay | Admitting: Internal Medicine

## 2022-04-04 MED ORDER — ALBUTEROL SULFATE HFA 108 (90 BASE) MCG/ACT IN AERS
2.0000 | INHALATION_SPRAY | Freq: Four times a day (QID) | RESPIRATORY_TRACT | 11 refills | Status: DC | PRN
Start: 2022-04-04 — End: 2022-05-23

## 2022-04-04 NOTE — Telephone Encounter (Signed)
ATC patient. LVMTCB. Rx for generic of albuterol has been sent in to the pharmacy. Nothing further needed.

## 2022-04-04 NOTE — Telephone Encounter (Signed)
Generic albuterol is fine, same sig with 11 refills

## 2022-04-04 NOTE — Telephone Encounter (Signed)
Called patient and he states that ProAir is no longer being made. Patient is requesting a different inhaler and for [place] to use the generic version.   Please advise sir

## 2022-04-24 ENCOUNTER — Telehealth: Payer: Self-pay | Admitting: Internal Medicine

## 2022-04-24 NOTE — Telephone Encounter (Signed)
Called and left voicemail for patient that his Breztri samples with be upfront and check out for him. Nothing further needed

## 2022-05-04 ENCOUNTER — Encounter: Payer: Self-pay | Admitting: Internal Medicine

## 2022-05-04 DIAGNOSIS — R0609 Other forms of dyspnea: Secondary | ICD-10-CM

## 2022-05-05 MED ORDER — PANTOPRAZOLE SODIUM 40 MG PO TBEC: 40.0000 mg | DELAYED_RELEASE_TABLET | Freq: Every day | ORAL | 2 refills | Status: DC

## 2022-05-11 NOTE — Telephone Encounter (Signed)
Pharmacy, are there any cheaper alternatives to [facility] for pt? Thank you!!!!

## 2022-05-12 ENCOUNTER — Other Ambulatory Visit (HOSPITAL_COMMUNITY): Payer: Self-pay

## 2022-05-12 NOTE — Telephone Encounter (Signed)
Dr. Melvyn Novas,  Please advise on pt's message. He cannot afford Breztri each month, he does not qualify for pt assistance and there are no cheaper alternatives. Thanks.

## 2022-05-15 NOTE — Telephone Encounter (Unsigned)
Will need ov next available to discuss limited options, can do free samples until returns

## 2022-05-23 ENCOUNTER — Ambulatory Visit: Payer: PPO | Admitting: Internal Medicine

## 2022-05-23 ENCOUNTER — Encounter: Payer: Self-pay | Admitting: Internal Medicine

## 2022-05-23 DIAGNOSIS — J449 Chronic obstructive pulmonary disease, unspecified: Secondary | ICD-10-CM | POA: Diagnosis not present

## 2022-05-23 DIAGNOSIS — J479 Bronchiectasis, uncomplicated: Secondary | ICD-10-CM

## 2022-05-23 MED ORDER — BUDESONIDE-FORMOTEROL FUMARATE 80-4.5 MCG/ACT IN AERO: INHALATION_SPRAY | RESPIRATORY_TRACT | 12 refills | Status: DC

## 2022-05-23 NOTE — Assessment & Plan Note (Signed)
Quit smoking 1992/MM - Spirometry 06/18/11 FEV1 1.27 (52%) ratio 40  - Spirometry 12/29/2016  FEV1 0.78 (34%)  Ratio 37  No saba first  - 12/29/2016   trial of bevespi 2bid  - alpha one AT screen 12/29/16 >  MM - Allergy profile  12/29/16  >    IgE  16  RAST neg   - 11/12/2017  After extensive coaching inhaler device  effectiveness =    90% with dpi > try trelegy if insurance covers - 11/13/2018 d/c trelegy trial basis as not sure it's helping > worse sob so restarted bevespi - 11/17/2019    try breztri 2bid > does remember  - 12/03/2020  After extensive coaching inhaler device,  effectiveness =    90% > rechallenge with Breztri - 12/15/2021 trained on Flutter valve and rx pred x 6 days only  - Spirometry 02/06/2022  FEV1 0.8 (35%)  Ratio 0.44 p breztri with classic concave f/v  - see phone message 05/17/22 re limited alternatives for copd meds on his formulary - can't afford breztri    Group D (now reclassified as E) in terms of symptom/risk and laba/lama/ICS  therefore appropriate rx at this point >>>  breztri but can't afford it > has some upper airway symptoms that may be related to high dose ics so try symbicort 80 2bid and approp saba:  Re SABA :  I spent extra time with pt today reviewing appropriate use of albuterol for prn use on exertion with the following points: 1) saba is for relief of sob that does not improve by walking a slower pace or resting but rather if the pt does not improve after trying this first. 2) If the pt is convinced, as many are, that saba helps recover from activity faster then it's easy to tell if this is the case by re-challenging : ie stop, take the inhaler, then p 5 minutes try the exact same activity (intensity of workload) that just caused the symptoms and see if they are substantially diminished or not after saba 3) if there is an activity that reproducibly causes the symptoms, try the saba 15 min before the activity on alternate days   If in fact the saba really does  help, then fine to continue to use it prn but advised may need to look closer at the maintenance regimen being used to achieve better control of airways disease with exertion.

## 2022-05-23 NOTE — Assessment & Plan Note (Addendum)
CT chest 12/28/16 1. No evidence of interstitial lung disease. 2. Diffuse bronchial wall thickening with moderate centrilobular and mild paraseptal emphysema; imaging findings compatible with the reported clinical history of COPD. 3. 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 - 12/15/2021 added flutter valve  - 02/06/22 added gerd rx > much improved   Minimally discolored mucus at present > rx flutter/ mucinex dm prn and reserve abx for more severe flare.   f/u q 6 m,  Call sooner if needed - full pfts on return  Each maintenance medication was reviewed in detail including emphasizing most importantly the difference between maintenance and prns and under what circumstances the prns are to be triggered using an action plan format where appropriate.  Total time for H and P, chart review, counseling, reviewing hfa device(s) and generating customized AVS unique to this office visit / same day charting = 31 min

## 2022-05-23 NOTE — Progress Notes (Signed)
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 rec If not revealing will consider going ahead with a cpst - we will call to schedule Try prilosec '20mg'$   Take 30-60 min before first meal of the day and Pepcid 20 mg one bedtime until  We complete your work up GERD diet     12/15/2021  f/u ov/Marc Flores re: GOLD 3/bronchiectasis   maint on breztri   Chief Complaint  Patient presents with   Follow-up    Follow up. Patient is having trouble breathing.   Dyspnea:  mb and back about stops half way does not prechallenge as rec with saba  Cough: day > night beige x 15 secs each am  - more if mows grass (riding)   Sleeping: flat bed 2 pillows  SABA use: 3-4 x daily  02: none  Covid status:   vax x 3  Rec For cough  > Mucinex dm 1200 mg every 12 hours as needed and try flutter valve as needed  Ok to try albuterol 15 min before an activity (on alternating days)  that you know would usually make you short of breath Prednisone 10 mg take  4 each am x 2 days,   2 each am x 2 days,  1 each am x 2 days and stop      02/06/2022  f/u ov/Marc Flores re: GOLD 3 copd/ bronchiectasis   Chief Complaint  Patient presents with   Follow-up    DOE progressively worse since last visit. He is getting winded walking approx 50 ft. He noticed some  wheezing a few days ago and is using his albuterol 5-6 x per day with relief for only about 30 min.   Dyspnea:  no longer mb - shed is slt downhill maybe 150 ft and can back without stopping  Cough: much better since prednisone and no worse off it  Sleeping: flat bed / 2 pillows no resp cc   SABA use: not better  02: none  Rec Ok to try albuterol 15 min before an activity (on alternating days)  that you know would usually make you short of breath Work on inhaler technique:   Pantoprazole (protonix) 40 mg   Take  30-60 min before first meal of the day and Pepcid (famotidine)  20 mg after supper until return to office  GERD diet  Make sure you check your oxygen saturation at your highest level of activity to be sure it stays over 90% Please schedule a follow up office visit in 6 weeks, call sooner if needed Needs repeat walk on RA and recheck cbc for high hgb    03/27/2022  f/u ov/Marc Flores re: GOLD 3 /bronchiectasis  maint on breztri   Chief Complaint  Patient presents with   Follow-up    Pt states much better since  LOV.  Dyspnea:  improved  to mb and shed s stopping with sats low 90s now Cough: better on gerd rx Sleeping: flat bed 2 pillows  SABA use: 4 x daily  just with activity, really feels it helps   02: none  Rec No change     05/23/2022  f/u ov/Marc Flores re: GOLD 3/bronchiectasis maint on breztri   Chief Complaint  Patient presents with   Follow-up    Having worse problems breathing. Pt states he has had a productive cough x2 weeks  Dyspnea:  3 aisles and has to stop/ still walking to mb/shed but stopping more often esp in heat Cough: light beige x 2 weeks, nothing really purulent Sleeping: flat bd 2 pillows  SABA use: ventolin every 6 hours  02: none    No obvious day to day or daytime variability or assoc  mucus plugs or hemoptysis or cp or chest tightness, subjective wheeze or overt sinus or hb symptoms.   sleeping without nocturnal  or early am exacerbation  of  respiratory  c/o's or need for noct saba. Also denies any obvious fluctuation of symptoms with weather or environmental changes or other aggravating or alleviating factors except as outlined above   No unusual exposure hx or h/o childhood pna/ asthma or knowledge of premature birth.  Current Allergies, Complete Past Medical History, Past Surgical History, Family History, and Social History were reviewed in [employer] record.  ROS  The following are not active complaints unless bolded Hoarseness, sore throat, dysphagia, dental problems, itching, sneezing,  nasal congestion or discharge of excess mucus or purulent secretions, ear ache,   fever, chills, sweats, unintended wt loss or wt gain, classically pleuritic or exertional cp,  orthopnea pnd or arm/hand swelling  or leg swelling, presyncope, palpitations, abdominal pain, anorexia, nausea, vomiting, diarrhea  or change in bowel habits or change in bladder habits, change in stools or change in urine, dysuria, hematuria,  rash, arthralgias, visual complaints, headache, numbness, weakness or ataxia or problems with walking or coordination,  change in mood or  memory.        Current Meds  Medication Sig   acetaminophen (TYLENOL) 500 MG tablet Take 500-1,000 mg by mouth every 6 (six) hours as needed for mild pain.   atorvastatin (LIPITOR) 10 MG tablet TAKE 1 TABLET BY MOUTH EVERY DAY   Budeson-Glycopyrrol-Formoterol (BREZTRI AEROSPHERE) 160-9-4.8 MCG/ACT AERO 2 puffs in morning 2 puffs about 12 hours later   colchicine 0.6 MG tablet TAKE 2 TABLETS BY MOUTH TWICE DAILY AS NEEDED FOR GOUT FLARE   famotidine (PEPCID) 20 MG tablet One after supper   pantoprazole (PROTONIX) 40 MG tablet Take 1 tablet (40 mg total) by mouth daily. Take 30-60 min before first meal of the day   tamsulosin (FLOMAX) 0.4 MG CAPS capsule Take 0.4 mg by mouth daily after supper.   [DISCONTINUED] albuterol (VENTOLIN HFA) 108 (90 Base) MCG/ACT inhaler Inhale 2  puffs into the lungs every 6 (six) hours as needed for wheezing or shortness of breath.   [DISCONTINUED] PROAIR HFA 108 (90 Base) MCG/ACT inhaler Inhale 2 puffs into the lungs every 4 (four) hours as needed for wheezing or shortness of breath.                      Objective:   Physical Exam  Wts  05/23/2022         173  03/27/2022        171   02/06/2022          167   12/15/2021        175  12/03/2020          178  11/17/2019        183 11/13/2018        186  .11/12/2017       178   01/26/2017       176  12/29/16 177 lb (80.3 kg)  12/20/16 176 lb 6.4 oz (80 kg)  12/08/11 179 lb (81.2 kg)      Vital signs reviewed  05/23/2022  - Note at rest 02 sats  92% on RA   General appearance:    amb slt hoarse wm nad    HEENT :  Oropharynx  clear      NECK :  without JVD/Nodes/TM/ nl carotid upstrokes bilaterally   LUNGS: no acc muscle use,  Mod barrel  contour chest wall with bilateral  Distant bs s audible wheeze and  without cough on insp or exp maneuvers and mod  Hyperresonant  to  percussion bilaterally     CV:  RRR  no s3 or murmur or increase in P2, and no edema   ABD:  soft and nontender with pos mid insp Hoover's  in the supine position. No bruits or organomegaly appreciated, bowel sounds nl  MS:   Ext warm without deformities or   obvious joint restrictions , calf tenderness, cyanosis or clubbing  SKIN: warm and dry without lesions    NEURO:  alert, approp, nl sensorium with  no motor or cerebellar deficits apparent.             I personally reviewed images and agree with radiology impression as follows:  CXR:   02/06/22 Similar appearance of the chest x-ray to 12/15/2021, with background chronic changes/emphysema, and no evidence of acute cardiopulmonary disease.           Assessment & Plan:

## 2022-05-23 NOTE — Patient Instructions (Signed)
Try symbiocrt 80 Take 2 puffs first thing in am and then another 2 puffs about 12 hours later.    Only use your albuterol as a rescue medication to be used if you can't catch your breath by resting or doing a relaxed purse lip breathing pattern.  - The less you use it, the better it will work when you need it. - Ok to use up to 2 puffs  every 4 hours if you must but call for immediate appointment if use goes up over your usual need - Don't leave home without it !!  (think of it like the spare tire for your car)    Please schedule a follow up visit in  6  months but call sooner if needed with PFTs (don't use your albuterol)

## 2022-06-13 ENCOUNTER — Other Ambulatory Visit: Payer: Self-pay | Admitting: Family Medicine

## 2022-07-03 ENCOUNTER — Ambulatory Visit: Payer: PPO | Admitting: Internal Medicine

## 2022-07-30 ENCOUNTER — Other Ambulatory Visit: Payer: Self-pay | Admitting: Internal Medicine

## 2022-07-30 DIAGNOSIS — R0609 Other forms of dyspnea: Secondary | ICD-10-CM

## 2022-08-21 ENCOUNTER — Encounter: Payer: Self-pay | Admitting: Internal Medicine

## 2022-09-01 ENCOUNTER — Telehealth: Payer: Self-pay | Admitting: Internal Medicine

## 2022-09-05 ENCOUNTER — Telehealth: Payer: Self-pay | Admitting: Internal Medicine

## 2022-09-05 IMAGING — DX DG CHEST 2V
2 series · 2 of 2 positions shown · non-contrast
Comparison: 12/15/2021

CLINICAL DATA: 81-year-old male with dyspnea on exertion and
history of COPD

EXAM:
CHEST - 2 VIEW

[chest pa]
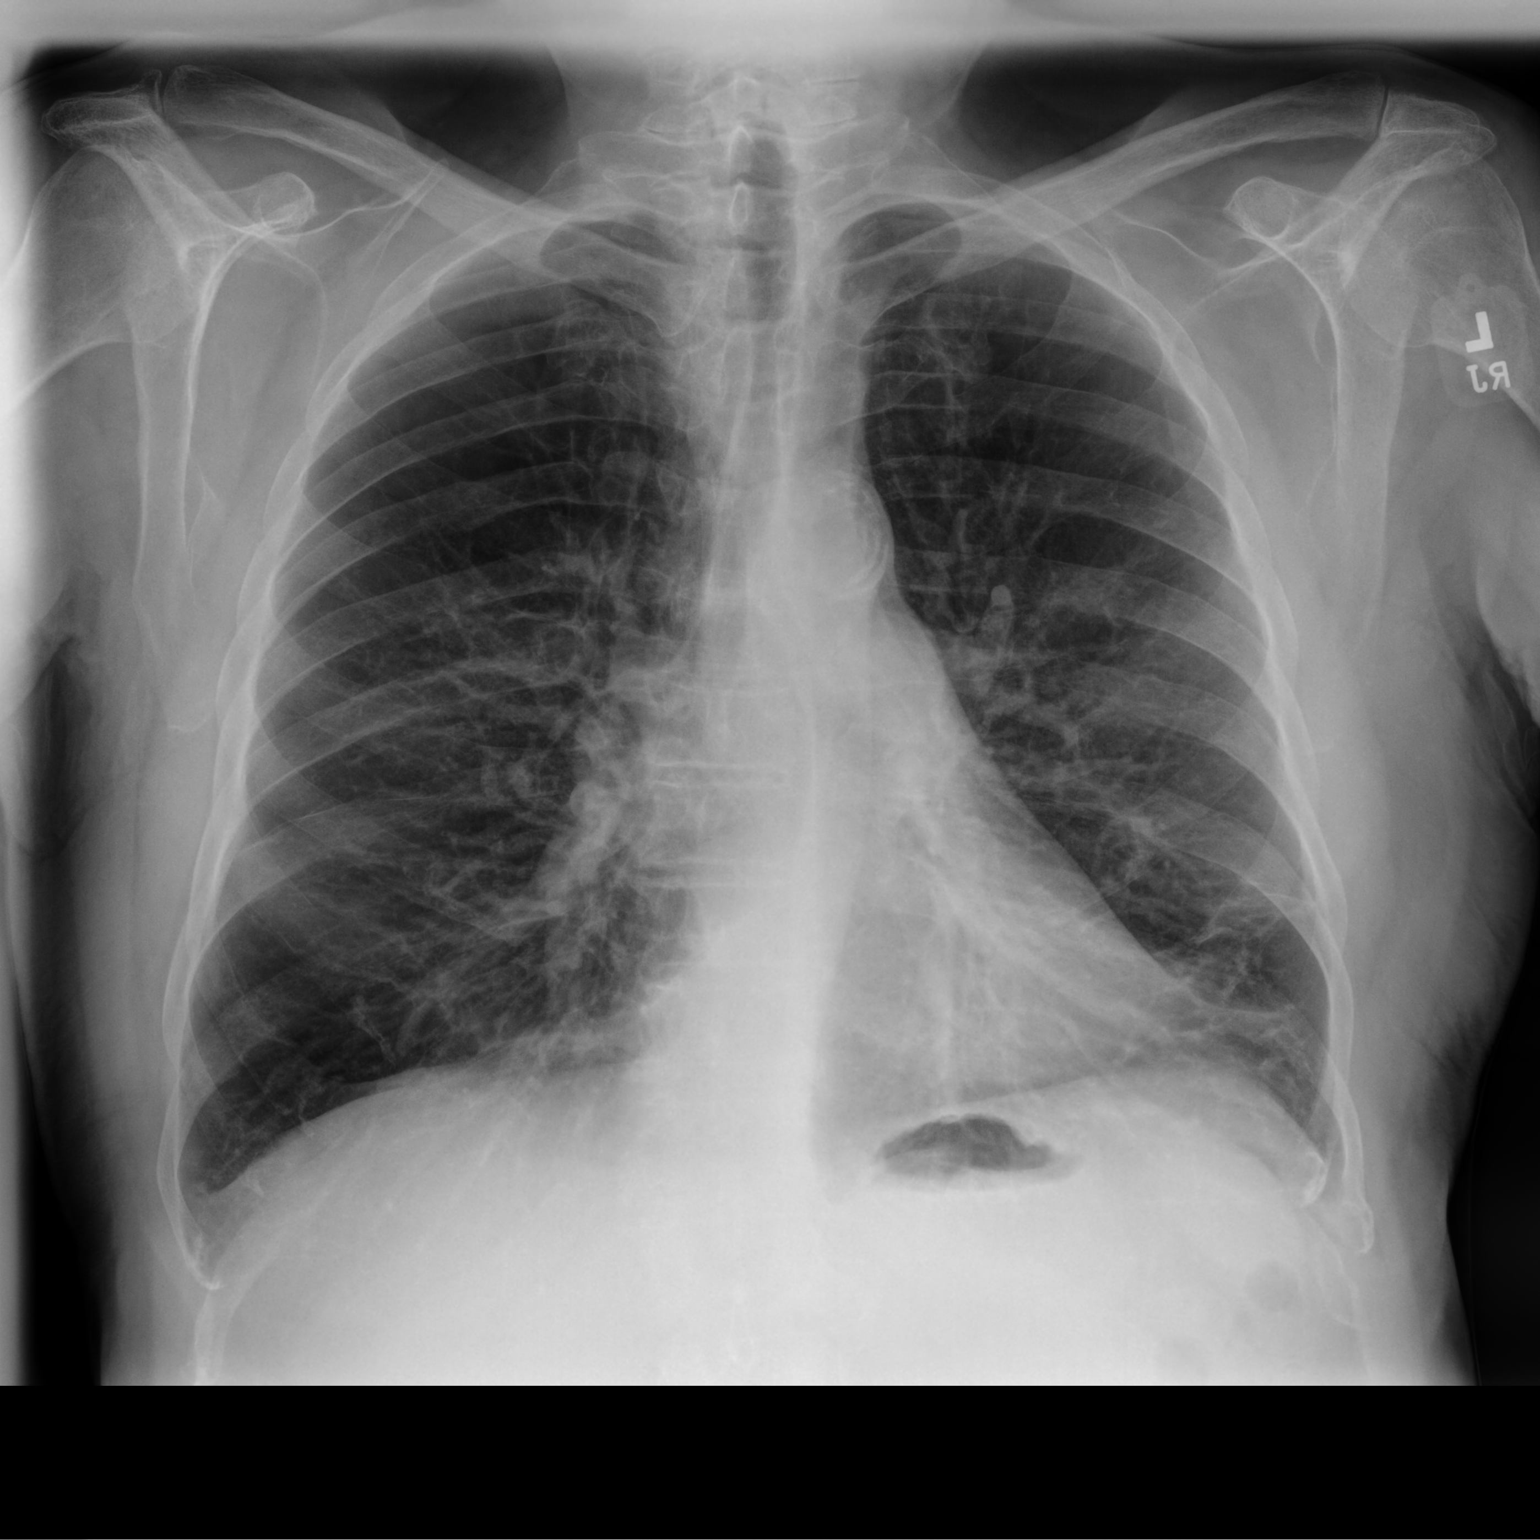

[chest lat]
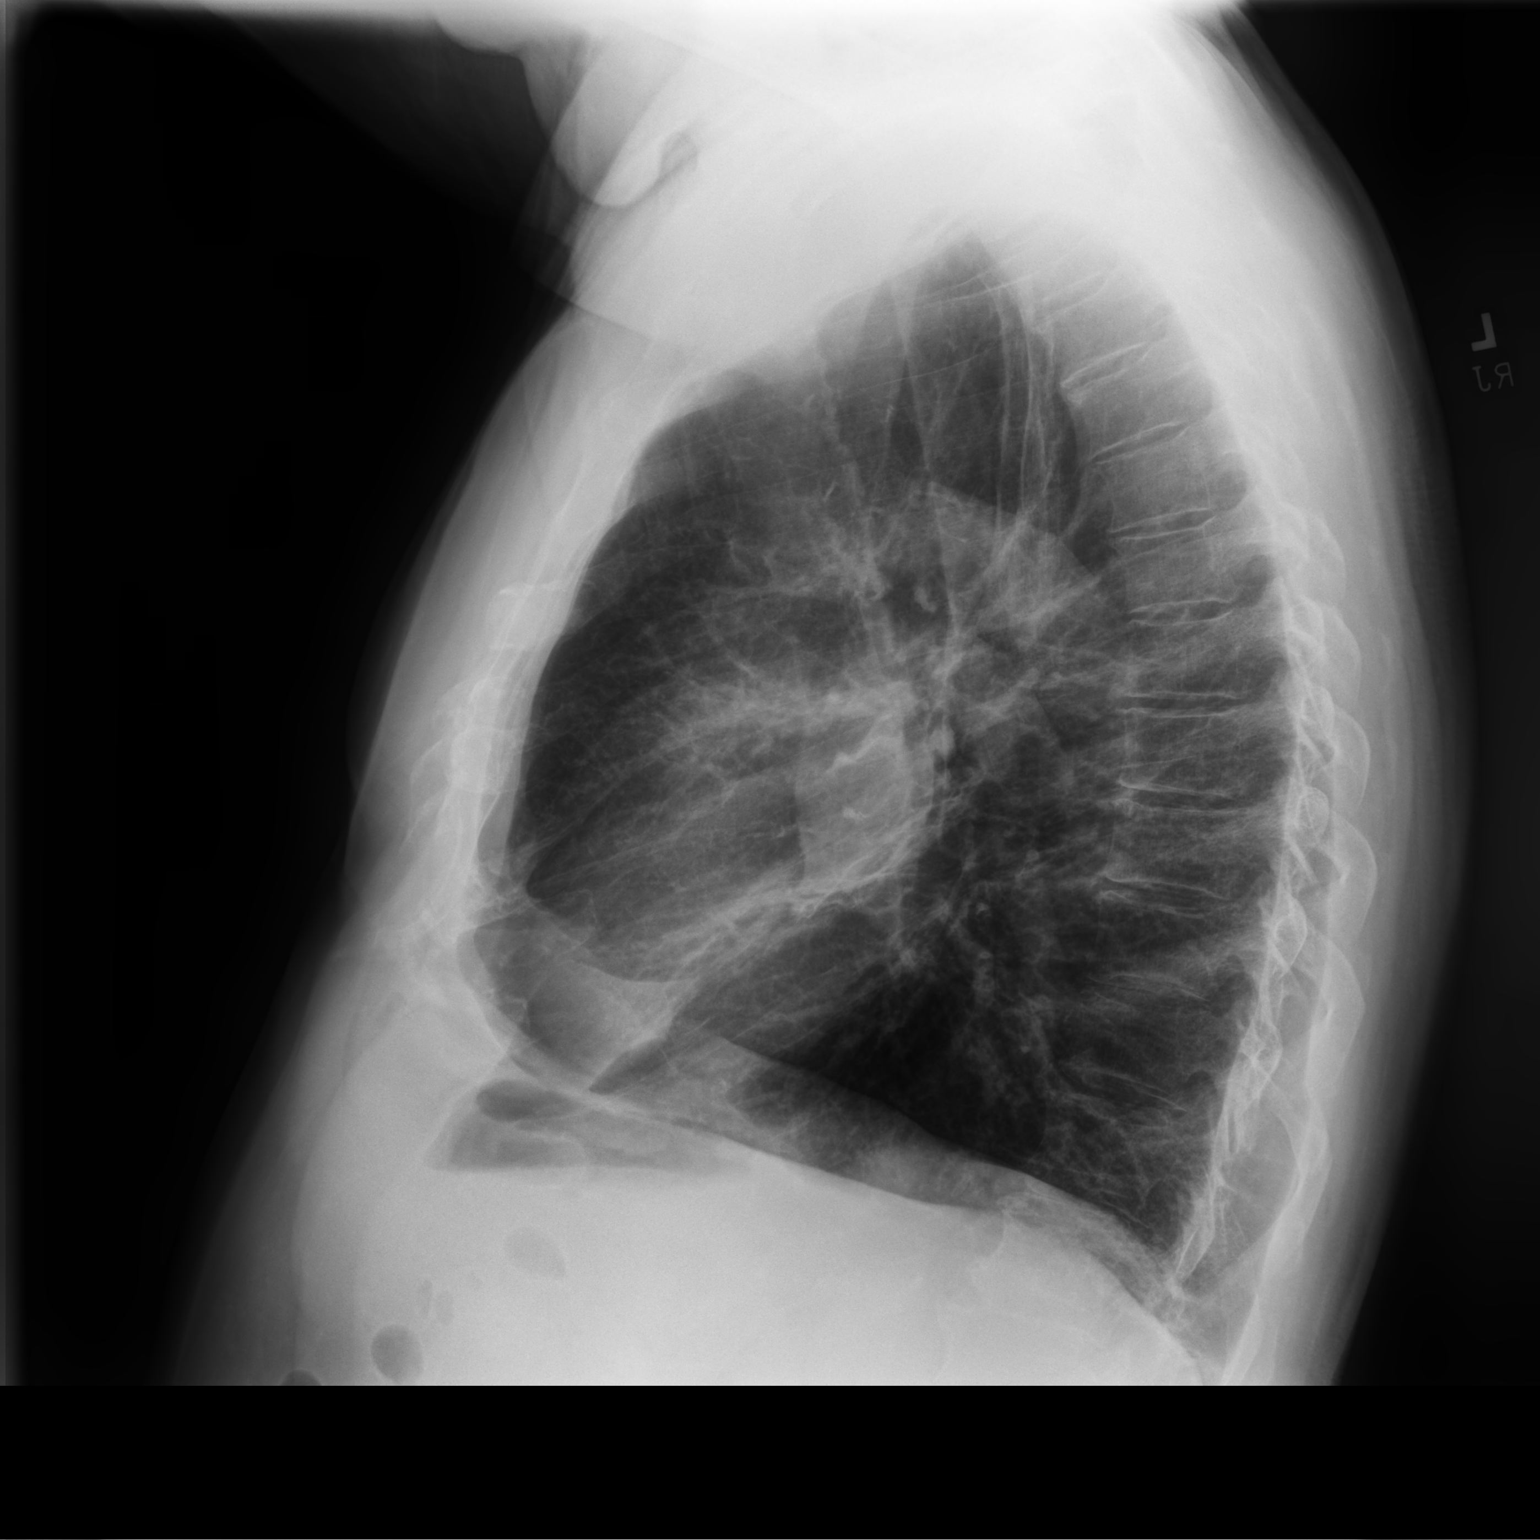

[2 of 2 positions shown; findings below may reference images not displayed]

FINDINGS: Cardiomediastinal silhouette unchanged in size and contour. No
evidence of central vascular congestion.

Stigmata of emphysema, with increased retrosternal airspace,
flattened hemidiaphragms, increased AP diameter, and hyperinflation
on the AP view.

No recurrent airspace disease at the right lung base. No areas of
new airspace disease in the left with the right lungs. No
pneumothorax or pleural effusion.

Coarsened interstitial markings persist.

Degenerative changes spine.  No displaced fracture.
IMPRESSION: Similar appearance of the chest x-ray to 12/15/2021, with background
chronic changes/emphysema, and no evidence of acute cardiopulmonary
disease.

## 2022-09-05 NOTE — Telephone Encounter (Signed)
Mychart message sent. Closing encounter. Nothing further needed.

## 2022-09-05 NOTE — Telephone Encounter (Signed)
Marc Flores, please advise if you know anything about this.

## 2022-09-08 NOTE — Telephone Encounter (Signed)
Replied to patient's mychart message, "Unfortunately, we do not handle medical records in house at the office. I have contacted the medical records department to check on the status of this request. The medical records department did receive the request, but the [place] did not put the dates they needed, so our medical records department sent a fax back to the [place] directly to get an updated forms. I attempted to tell them what dates are needed, but they need the form from the [place]. If you would like to contact the medical records department yourself, you can call [phone]."

## 2022-09-09 ENCOUNTER — Other Ambulatory Visit: Payer: Self-pay | Admitting: Family Medicine

## 2022-09-11 ENCOUNTER — Other Ambulatory Visit: Payer: Self-pay | Admitting: Family Medicine

## 2022-09-14 ENCOUNTER — Telehealth: Payer: Self-pay | Admitting: Internal Medicine

## 2022-09-14 NOTE — Telephone Encounter (Signed)
Fine with me:  Rx breztri Take 2 puffs first thing in am and then another 2 puffs about 12 hours later.   And 11 refills

## 2022-09-15 MED ORDER — BREZTRI AEROSPHERE 160-9-4.8 MCG/ACT IN AERO: INHALATION_SPRAY | RESPIRATORY_TRACT | 11 refills | Status: DC

## 2022-09-15 NOTE — Telephone Encounter (Signed)
ATC LVMTCB x 1 ?

## 2022-09-15 NOTE — Telephone Encounter (Signed)
Pt called the office. I let him know that MW was fine with him switching to [place]. Rx has been sent to preferred pharmacy. Nothing further needed.

## 2022-09-15 NOTE — Telephone Encounter (Signed)
Patient stated he missed a call and would like a call back.  CB# [phone]

## 2022-10-09 ENCOUNTER — Other Ambulatory Visit: Payer: Self-pay | Admitting: Family Medicine

## 2022-10-16 ENCOUNTER — Other Ambulatory Visit: Payer: Self-pay | Admitting: Family Medicine

## 2022-10-16 ENCOUNTER — Other Ambulatory Visit: Payer: Self-pay

## 2022-10-16 ENCOUNTER — Telehealth: Payer: Self-pay | Admitting: Family Medicine

## 2022-10-16 MED ORDER — ATORVASTATIN CALCIUM 10 MG PO TABS: 10.0000 mg | ORAL_TABLET | Freq: Every day | ORAL | 0 refills | Status: DC

## 2022-10-16 NOTE — Telephone Encounter (Signed)
Prescription Request  10/16/2022  Is this a "Controlled Substance" medicine? No  LOV: Visit date not found  What is the name of the medication or equipment? atorvastatin (LIPITOR) 10 MG tablet   Have you contacted your pharmacy to request a refill? No   Which pharmacy would you like this sent to?  [facility] [place], [place] - [address] [place] [address] [place] [address] [place] [address] Phone: [phone] Fax: [phone]    Patient notified that their request is being sent to the clinical staff for review and that they should receive a response within 2 business days.   Please advise at Mobile [phone] (mobile)

## 2022-10-16 NOTE — Telephone Encounter (Signed)
30 day supply sent.   Needing appt information placed on prescription.

## 2022-10-29 ENCOUNTER — Other Ambulatory Visit: Payer: Self-pay | Admitting: Internal Medicine

## 2022-10-29 DIAGNOSIS — R0609 Other forms of dyspnea: Secondary | ICD-10-CM

## 2022-11-21 ENCOUNTER — Ambulatory Visit (INDEPENDENT_AMBULATORY_CARE_PROVIDER_SITE_OTHER): Payer: PPO | Admitting: Internal Medicine

## 2022-11-21 ENCOUNTER — Telehealth: Payer: Self-pay | Admitting: Internal Medicine

## 2022-11-21 ENCOUNTER — Encounter: Payer: Self-pay | Admitting: Internal Medicine

## 2022-11-21 VITALS — BP 134/76 | HR 68 | Temp 97.7°F | Ht 66.0 in | Wt 176.6 lb

## 2022-11-21 DIAGNOSIS — R0902 Hypoxemia: Secondary | ICD-10-CM | POA: Diagnosis not present

## 2022-11-21 DIAGNOSIS — J449 Chronic obstructive pulmonary disease, unspecified: Secondary | ICD-10-CM

## 2022-11-21 NOTE — Telephone Encounter (Signed)
Mardene Celeste states patient's order for oxygen needs to state continous oxygen. Mardene Celeste phone number is [phone].

## 2022-11-21 NOTE — Patient Instructions (Signed)
My office will be contacting you by phone for referral to PFT  - if you don't hear back from my office within one week please call us back or notify us thru MyChart and we'll address it right away.   Also  Ok to try albuterol 15 min before an activity (on alternating days)  that you know would usually make you short of breath and see if it makes any difference and if makes none then don't take albuterol after activity unless you can't catch your breath as this means it's the resting that helps, not the albuterol.   Please schedule a follow up visit in 6 months but call sooner if needed

## 2022-11-21 NOTE — Telephone Encounter (Signed)
Updated oxygen order placed. Nothing further needed

## 2022-11-21 NOTE — Assessment & Plan Note (Addendum)
11/21/2022 qualified for amb 02 Patient walked at moderate pace. patient was able to complete 500  on room air. c/o SOB and dyspnea with desats to 87% correlating with need to stop.  Patient put on 3 lpm oxygen POC pulsed. SaO2 at 94%. Patient was able to complete another 250 ft  without difficulty. SaO2 at 94%. No c/o dyspnea or SOB noted on 02    Rec 3lpm POC with walking more than 500 ft  Make sure you check your oxygen saturation  AT  your highest level of activity (not after you stop)   to be sure it stays over 90% and adjust  02 flow upward to maintain this level if needed but remember to turn it back to previous settings when you stop (to conserve your supply).   Please schedule a follow up visit in 3 months but call sooner if needed          Each maintenance medication was reviewed in detail including emphasizing most importantly the difference between maintenance and prns and under what circumstances the prns are to be triggered using an action plan format where appropriate.  Total time for H and P, chart review, counseling, reviewing hfa/02 device(s) , directly observing portions of ambulatory 02 saturation study/ and generating customized AVS unique to this office visit / same day charting = 30 min

## 2022-11-21 NOTE — Assessment & Plan Note (Signed)
Quit smoking 1992/MM - Spirometry 06/18/11 FEV1 1.27 (52%) ratio 40  - Spirometry 12/29/2016  FEV1 0.78 (34%)  Ratio 37  No saba first  - 12/29/2016   trial of bevespi 2bid  - alpha one AT screen 12/29/16 >  MM - Allergy profile  12/29/16  >    IgE  16  RAST neg   - 11/12/2017  After extensive coaching inhaler device  effectiveness =    90% with dpi > try trelegy if insurance covers - 11/13/2018 d/c trelegy trial basis as not sure it's helping > worse sob so restarted bevespi - 11/17/2019    try breztri 2bid > does remember  - 12/03/2020  After extensive coaching inhaler device,  effectiveness =    90% > rechallenge with Breztri - 12/15/2021 trained on Flutter valve and rx pred x 6 days only  - Spirometry 02/06/2022  FEV1 0.8 (35%)  Ratio 0.44 p breztri with classic concave f/v  - see phone message 05/17/22 re limited alternatives for copd meds on his formulary - can't afford breztri  - new dx ex hypoxemia  > see sep a/p   Group D (now reclassified as E) in terms of symptom/risk and laba/lama/ICS  therefore appropriate rx at this point >>>  breztri and more approp saba  Re SABA :  I spent extra time with pt today reviewing appropriate use of albuterol for prn use on exertion with the following points: 1) saba is for relief of sob that does not improve by walking a slower pace or resting but rather if the pt does not improve after trying this first. 2) If the pt is convinced, as many are, that saba helps recover from activity faster then it's easy to tell if this is the case by re-challenging : ie stop, take the inhaler, then p 5 minutes try the exact same activity (intensity of workload) that just caused the symptoms and see if they are substantially diminished or not after saba 3) if there is an activity that reproducibly causes the symptoms, try the saba 15 min before the activity on alternate days   If in fact the saba really does help, then fine to continue to use it prn but advised may need to look  closer at the maintenance regimen being used to achieve better control of airways disease with exertion.

## 2022-11-21 NOTE — Progress Notes (Unsigned)
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 rec If not revealing will consider going ahead with a cpst - we will call to schedule Try prilosec 20mg   Take 30-60 min before first meal of the day and Pepcid 20 mg one bedtime until  We complete your work up GERD diet      05/23/2022  f/u ov/Marc Flores re: GOLD 3/bronchiectasis maint on breztri   Chief Complaint  Patient presents with   Follow-up    Having worse problems breathing. Pt states he has had a productive cough x2 weeks  Dyspnea:  3 aisles and has to stop/ still walking to mb/shed but stopping more often esp in heat Cough: light beige x 2 weeks, nothing really purulent Sleeping: flat bd 2 pillows  SABA use: ventolin every 6 hours  02: none  Rec Try symbiocrt 80 Take 2 puffs first thing in am and then another 2 puffs about 12 hours later.  Only use your albuterol as a rescue medication      11/21/2022  f/u ov/Marc Flores re: GOLD 3/ bronchiectasis  maint on breztri and freq saba p ex Chief Complaint  Patient presents with   Follow-up    Dyspnea persistent.  Albuterol not helping sx.   Dyspnea:  no change doe  Cough: none  Sleeping: flat bd 2 pillows  SABA use: way too much  02: none    No obvious day to day or daytime variability or assoc excess/  purulent sputum or mucus plugs or hemoptysis or cp or chest tightness, subjective wheeze or overt sinus or hb symptoms.   Sleeping ok as above without nocturnal  or early am exacerbation  of respiratory  c/o's or need for noct saba. Also denies any obvious fluctuation of symptoms with weather or environmental changes or other aggravating or alleviating factors except as outlined above   No unusual exposure hx or h/o childhood pna/ asthma or knowledge of premature birth.  Current Allergies, Complete Past Medical History, Past Surgical History, Family History, and Social History were reviewed in [employer] record.  ROS  The following are not active complaints unless bolded Hoarseness, sore throat, dysphagia, dental problems, itching, sneezing,  nasal congestion or discharge of excess mucus or purulent secretions, ear ache,   fever, chills, sweats, unintended wt loss or wt gain, classically pleuritic or exertional cp,  orthopnea pnd or arm/hand swelling  or leg swelling, presyncope, palpitations, abdominal pain, anorexia, nausea, vomiting, diarrhea  or change in bowel habits or change in bladder habits, change in stools or change in urine, dysuria, hematuria,  rash, arthralgias, visual complaints, headache, numbness, weakness or ataxia or problems with walking or coordination,  change  in mood or  memory.        Current Meds  Medication Sig   acetaminophen (TYLENOL) 500 MG tablet Take 500-1,000 mg by mouth every 6 (six) hours as needed for mild pain.   albuterol (VENTOLIN HFA) 108 (90 Base) MCG/ACT inhaler Inhale 2 puffs into the lungs every 6 (six) hours as needed.   atorvastatin (LIPITOR) 10 MG tablet TAKE 1 TABLET(10 MG) BY MOUTH DAILY   Budeson-Glycopyrrol-Formoterol (BREZTRI AEROSPHERE) 160-9-4.8 MCG/ACT AERO 2 puffs in morning 2 puffs about 12 hours later   colchicine 0.6 MG tablet TAKE 2 TABLETS BY MOUTH TWICE DAILY AS NEEDED FOR GOUT FLARE   famotidine (PEPCID) 20 MG  tablet One after supper   pantoprazole (PROTONIX) 40 MG tablet TAKE 1 TABLET(40 MG) BY MOUTH DAILY 30 TO 60 MINUTES BEFORE FIRST MEAL OF THE DAY   tamsulosin (FLOMAX) 0.4 MG CAPS capsule Take 0.4 mg by mouth daily after supper.                      Objective:   Physical Exam   Wts  11/21/2022         176  05/23/2022         173  03/27/2022        171   02/06/2022          167   12/15/2021        175  12/03/2020          178  11/17/2019        183 11/13/2018        186  .11/12/2017       178   01/26/2017       176  12/29/16 177 lb (80.3 kg)  12/20/16 176 lb 6.4 oz (80 kg)  12/08/11 179 lb (81.2 kg)     Vital signs reviewed  11/21/2022  - Note at rest 02 sats  95% on RA   General appearance:    amb pleasant wm nad  HEENT : Oropharynx  clear     NECK :  without  apparent JVD/ palpable Nodes/TM    LUNGS: no acc muscle use,  Mild barrel  contour chest wall with bilateral  Distant bs s audible wheeze and  without cough on insp or exp maneuvers  and mild  Hyperresonant  to  percussion bilaterally     CV:  RRR  no s3 or murmur or increase in P2, and no edema   ABD:  soft and nontender with pos end  insp Hoover's  in the supine position.  No bruits or organomegaly appreciated   MS:  Nl gait/ ext warm without deformities Or obvious joint restrictions  calf tenderness, cyanosis or clubbing     SKIN: warm and dry without lesions    NEURO:  alert, approp, nl sensorium with  no motor or cerebellar deficits apparent.              Assessment & Plan:

## 2022-11-22 ENCOUNTER — Encounter: Payer: Self-pay | Admitting: Internal Medicine

## 2022-11-23 DIAGNOSIS — J449 Chronic obstructive pulmonary disease, unspecified: Secondary | ICD-10-CM | POA: Diagnosis not present

## 2022-12-13 ENCOUNTER — Telehealth: Payer: Self-pay | Admitting: Internal Medicine

## 2022-12-14 NOTE — Telephone Encounter (Signed)
Called the pt and there was no answer- LMTCB   If they need his whole medical record he will need to reach out to medical records dept for this.

## 2022-12-18 ENCOUNTER — Telehealth: Payer: Self-pay | Admitting: Family Medicine

## 2022-12-18 NOTE — Telephone Encounter (Signed)
Contacted Salome Arnt Lazalde to schedule their annual wellness visit. Appointment made for 12/21/22.  Rudell Cobb AWV direct phone # [phone]   Change appt to phone and also changed time Pt aware

## 2022-12-21 ENCOUNTER — Encounter: Payer: Self-pay | Admitting: Family Medicine

## 2022-12-21 ENCOUNTER — Telehealth (INDEPENDENT_AMBULATORY_CARE_PROVIDER_SITE_OTHER): Payer: PPO | Admitting: Family Medicine

## 2022-12-21 DIAGNOSIS — Z0000 Encounter for general adult medical examination without abnormal findings: Secondary | ICD-10-CM | POA: Diagnosis not present

## 2022-12-21 NOTE — Patient Instructions (Signed)
I really enjoyed getting to talk with you today! I am available on Tuesdays and Thursdays for virtual visits if you have any questions or concerns, or if I can be of any further assistance.   CHECKLIST FROM ANNUAL WELLNESS VISIT:  -Follow up (please call to schedule if not scheduled after visit):   -yearly for annual wellness visit with primary care office  Here is a list of your preventive care/health maintenance measures and the plan for each if any are due:  PLAN For any measures below that may be due:  -can get the vaccines at the pharmacy - please let us know if/when you do so that we can update your record  Health Maintenance  Topic Date Due   Zoster Vaccines- Shingrix (1 of 2) Never done   COVID-19 Vaccine (4 - 2023-24 season) 05/05/2022   Medicare Annual Wellness (AWV)  12/21/2023   DTaP/Tdap/Td (3 - Td or Tdap) 04/21/2029   Pneumonia Vaccine 72+ Years old  Completed   HPV VACCINES  Aged Out   INFLUENZA VACCINE  Discontinued    -See a dentist at least yearly  -Get your eyes checked and then per your eye specialist's recommendations  -Other issues addressed today:   -I have included below further information regarding a healthy whole foods based diet, physical activity guidelines for adults, stress management and opportunities for social connections. I hope you find this information useful.   -----------------------------------------------------------------------------------------------------------------------------------------------------------------------------------------------------------------------------------------------------------  NUTRITION: -eat real food: lots of colorful vegetables (half the plate) and fruits -5-7 servings of vegetables and fruits per day (fresh or steamed is best), exp. 2 servings of vegetables with lunch and dinner and 2 servings of fruit per day. Berries and greens such as kale and collards are great choices.  -consume on a regular basis:  whole grains (make sure first ingredient on label contains the word "whole"), fresh fruits, fish, nuts, seeds, healthy oils (such as olive oil, avocado oil, grape seed oil) -may eat small amounts of dairy and lean meat on occasion, but avoid processed meats such as ham, bacon, lunch meat, etc. -drink water -try to avoid fast food and pre-packaged foods, processed meat -most experts advise limiting sodium to <  per day, should limit further is any chronic conditions such as high blood pressure, heart disease, diabetes, etc. The American Heart Association advised that <  is is ideal -try to avoid foods that contain any ingredients with names you do not recognize  -try to avoid sugar/sweets (except for the natural sugar that occurs in fresh fruit) -try to avoid sweet drinks -try to avoid white rice, white bread, pasta (unless whole grain), white or yellow potatoes  EXERCISE GUIDELINES FOR ADULTS: -if you wish to increase your physical activity, do so gradually and with the approval of your doctor -STOP and seek medical care immediately if you have any chest pain, chest discomfort or trouble breathing when starting or increasing exercise  -move and stretch your body, legs, feet and arms when sitting for long periods -Physical activity guidelines for optimal health in adults: -least 150 minutes per week of aerobic exercise (can talk, but not sing) once approved by your doctor, 20-30 minutes of sustained activity or two 10 minute episodes of sustained activity every day.  -resistance training at least 2 days per week if approved by your doctor -balance exercises 3+ days per week:   Stand somewhere where you have something sturdy to hold onto if you lose balance.    1) lift up on toes,  start with 5x per day and work up to 20x   2) stand and lift on leg straight out to the side so that foot is a few inches of the floor, start with 5x each side and work up to 20x each side   3) stand on one  foot, start with 5 seconds each side and work up to 20 seconds on each side  If you need ideas or help with getting more active:  -Silver sneakers https://tools.silversneakers.com  -Walk with a Doc: http://www.duncan-williams.com/  -try to include resistance (weight lifting/strength building) and balance exercises twice per week: or the following link for ideas: http://castillo-powell.com/  BuyDucts.dk  STRESS MANAGEMENT: -can try meditating, or just sitting quietly with deep breathing while intentionally relaxing all parts of your body for 5 minutes daily -if you need further help with stress, anxiety or depression please follow up with your primary doctor or contact the wonderful folks at [facility]: [phone]  SOCIAL CONNECTIONS: -options in [place] if you wish to engage in more social and exercise related activities:  -Silver sneakers https://tools.silversneakers.com  -Walk with a Doc: http://www.duncan-williams.com/  -Check out the [facility] Active Adults 50+ section on the [place] [employer] (hiking clubs, book clubs, cards and games, chess, exercise classes, aquatic classes and much more) - see the website for details: https://www.[place].gov/departments/parks-recreation/active-adults50  -YouTube has lots of exercise videos for different ages and abilities as well  -[facility] (a variety of indoor and outdoor inperson activities for adults). [phone]. [address].  -Virtual Online Classes (a variety of topics): see seniorplanet.org or call [phone]  -consider volunteering at a school, hospice center, church, senior center or elsewhere         ADVANCED HEALTHCARE DIRECTIVES:  Everyone should have advanced health care directives in place. This is so that you get the care you want, should you ever be in a situation where  you are unable to make your own medical decisions.   From the [place] Advanced Directive Website: "Advance Health Care Directives are legal documents in which you give written instructions about your health care if, in the future, you cannot speak for yourself.   A health care power of attorney allows you to name a person you trust to make your health care decisions if you cannot make them yourself. A declaration of a desire for a natural death (or living will) is document, which states that you desire not to have your life prolonged by extraordinary measures if you have a terminal or incurable illness or if you are in a vegetative state. An advance instruction for mental health treatment makes a declaration of instructions, information and preferences regarding your mental health treatment. It also states that you are aware that the advance instruction authorizes a mental health treatment provider to act according to your wishes. It may also outline your consent or refusal of mental health treatment. A declaration of an anatomical gift allows anyone over the age of 58 to make a gift by will, organ donor card or other document."   Please see the following website or an elder law attorney for forms, FAQs and for completion of advanced directives: [place] Health Care Directives Advance Health Care Directives (http://guzman.com/)  Or copy and paste the following to your web browser: PoshChat.fi

## 2022-12-21 NOTE — Telephone Encounter (Signed)
Attempted to call pt but unable to reach. Left him a detailed message of the fax and phone numbers for the medical records dept so he can provide this info to the [place]. Nothing further needed.

## 2022-12-21 NOTE — Progress Notes (Signed)
PATIENT CHECK-IN and HEALTH RISK ASSESSMENT QUESTIONNAIRE:  -completed by phone/video for upcoming Medicare Preventive Visit  Pre-Visit Check-in: 1)Vitals (height, wt, BP, etc) - record in vitals section for visit on day of visit 2)Review and Update Medications, Allergies PMH, Surgeries, Social history in Epic 3)Hospitalizations in the last year with date/reason? N/a  4)Review and Update Care Team (patient's specialists) in Epic 5) Complete PHQ9 in Epic  6) Complete Fall Screening in Epic 7)Review all Health Maintenance Due and order under PCP if not done.  Medicare Wellness Patient Questionnaire:  Answer theses question about your habits: Do you drink alcohol? No If yes, how many drinks do you have a day?n/a Have you ever smoked?Yes  Quit date if applicable? 1993  How many packs a day do/did you smoke? N/a Do you use smokeless tobacco?no Do you use an illicit drugs? No Do you exercises? No IF so, what type and how many days/minutes per week?walks on property frequently doing yard work - very active caring for home and property.  Are you sexually active? Yes Number of partners?1 Eats mostly at home - eats lots of fruits and veggies Typical breakfast Toast, ham, eggs; cereal Typical lunch cold cut sandwich; leftovers Typical dinner protein, veg carb combo Typical snacks: Nuts  Beverages: coffee, water, occ wine  Answer theses question about you: Can you perform most household chores?Yes Do you find it hard to follow a conversation in a noisy room? No Do you often ask people to speak up or repeat themselves? No Do you feel that you have a problem with memory? No Do you balance your checkbook and or bank acounts? Yes Do you feel safe at home? Yes Last dentist visit? About 2 years ago Do you need assistance with any of the following: Please note if so No  Driving?  Feeding yourself?  Getting from bed to chair?  Getting to the toilet?  Bathing or showering?  Dressing  yourself?  Managing money?  Climbing a flight of stairs  Preparing meals?    Do you have Advanced Directives in place (Living Will, Healthcare Power or Attorney)? No, but wants more info.   Last eye Exam and location? Last year; Dr Arn Medal [facility]   Do you currently use prescribed or non-prescribed narcotic or opioid pain medications? No  Do you have a history or close family history of breast, ovarian, tubal or peritoneal cancer or a family member with BRCA (breast cancer susceptibility 1 and 2) gene mutations? No  Nurse/Assistant Credentials/time stamp: Claudette Laws BSN, Editor, commissioning [facility] [place] Clinical RN Supervisor    ----------------------------------------------------------------------------------------------------------------------------------------------------------------------------------------------------------------------    MEDICARE ANNUAL PREVENTIVE CARE VISIT WITH PROVIDER (Welcome to [facility], initial annual wellness or annual wellness exam)  Virtual Visit via Phone Note  I connected with Marc Flores on 12/21/22  by phone and verified that I am speaking with the correct person using two identifiers.  Location patient: home Location provider:work or home office Persons participating in the virtual visit: patient, provider  Concerns and/or follow up today: reports is doing ok other than COPD - sees Pulmonologist for this - reports only change is they now put him on oxygen and he is following closely with them.    See HM section in Epic for other details of completed HM.    ROS: negative for report of fevers, unintentional weight loss, vision changes, vision loss, hearing loss or change, chest pain, sob, hemoptysis, melena, hematochezia, hematuria, falls, bleeding or bruising, thoughts of suicide or self  harm, memory loss  Patient-completed extensive health risk assessment - reviewed and discussed with the patient: See  Health Risk Assessment completed with patient prior to the visit either above or in recent phone note. This was reviewed in detailed with the patient today and appropriate recommendations, orders and referrals were placed as needed per Summary below and patient instructions.   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 Sherene Sires     Diverticulitis    Dyspnea    ON EXERTION   History of chickenpox    Hyperlipidemia     Past Surgical History:  Procedure Laterality Date   COLONOSCOPY  04/13/2020   per Dr. Jeani Hawking, adenomaotus polyps, repeat in 7 yrs    CYSTOSCOPY  11/27/2019   IN [facility]   CYSTOSCOPY W/ RETROGRADES Bilateral 12/18/2019   Procedure: CYSTOSCOPY WITH RETROGRADE PYELOGRAM;  Surgeon: Marcine Matar, MD;  Location: [facility];  Service: Urology;  Laterality: Bilateral;   TONSILLECTOMY  AS CHILD   TRANSURETHRAL RESECTION OF BLADDER TUMOR WITH MITOMYCIN-C N/A 12/18/2019   Procedure: TRANSURETHRAL RESECTION OF BLADDER TUMOR, RIGHT URETEROSCOPY AND RIGHT URETERAL STENT PLACEMENT;  Surgeon: Marcine Matar, MD;  Location[facility];  Service: Urology;  Laterality: N/A;   WISDOM TOOTH EXTRACTION  AS CHILD    Social History   Socioeconomic History   Marital status: Married    Spouse name: Not on file   Number of children: 2   Years of education: Not on file   Highest education level: Not on file  Occupational History   Occupation: security  Tobacco Use   Smoking status: Former    Packs/day: 1.00    Years: 30.00    Additional pack  years: 0.00    Total pack years: 30.00    Types: Cigarettes    Quit date: 12/08/1991    Years since quitting: 31.0    Passive exposure: Past   Smokeless tobacco: Never  Vaping Use   Vaping Use: Never used  Substance and Sexual Activity   Alcohol use: Yes   Drug use: No   Sexual activity: Yes  Other Topics Concern   Not on file  Social History Narrative   Not on file   Social Determinants of Health   Financial Resource Strain: Low Risk  (12/19/2021)   Overall Financial Resource Strain (CARDIA)    Difficulty of Paying Living Expenses: Not hard at all  Food Insecurity: No Food Insecurity (12/19/2021)   Hunger Vital Sign    Worried About Running Out of Food in the Last Year: Never true    Ran Out of Food in the Last Year: Never true  Transportation Needs: No Transportation Needs (12/19/2021)   PRAPARE - Administrator, Civil Service (Medical): No    Lack of Transportation (Non-Medical): No  Physical Activity: Sufficiently Active (12/19/2021)   Exercise Vital Sign    Days of Exercise per Week: 7 days    Minutes of Exercise per Session: 150+ min  Stress: No Stress Concern Present (12/19/2021)   [employer] of  Occupational Health - Occupational Stress Questionnaire    Feeling of Stress : Not at all  Social Connections: Moderately Isolated (12/19/2021)   Social Connection and Isolation Panel [NHANES]    Frequency of Communication with Friends and Family: More than three times a week    Frequency of Social Gatherings with Friends and Family: More than three times a week    Attends Religious Services: Never    Database administrator or Organizations: No    Attends Banker Meetings: Never    Marital Status: Married  Catering manager Violence: Not At Risk (12/19/2021)   Humiliation, Afraid, Rape, and Kick questionnaire    Fear of Current or Ex-Partner: No    Emotionally Abused: No    Physically Abused: No    Sexually Abused: No    Family History   Problem Relation Age of Onset   Glaucoma Mother    Diabetes Mother    Hearing loss Mother    Heart disease Mother    Heart disease Father    Atrial fibrillation Father    Arthritis Father    Cancer Father    Hearing loss Father    Hypertension Father     Current Outpatient Medications on File Prior to Visit  Medication Sig Dispense Refill   albuterol (VENTOLIN HFA) 108 (90 Base) MCG/ACT inhaler Inhale 2 puffs into the lungs every 6 (six) hours as needed.     atorvastatin (LIPITOR) 10 MG tablet TAKE 1 TABLET(10 MG) BY MOUTH DAILY 90 tablet 0   Budeson-Glycopyrrol-Formoterol (BREZTRI AEROSPHERE) 160-9-4.8 MCG/ACT AERO 2 puffs in morning 2 puffs about 12 hours later 10.7 g 11   colchicine 0.6 MG tablet TAKE 2 TABLETS BY MOUTH TWICE DAILY AS NEEDED FOR GOUT FLARE 60 tablet 0   pantoprazole (PROTONIX) 40 MG tablet TAKE 1 TABLET(40 MG) BY MOUTH DAILY 30 TO 60 MINUTES BEFORE FIRST MEAL OF THE DAY 30 tablet 2   tamsulosin (FLOMAX) 0.4 MG CAPS capsule Take 0.4 mg by mouth daily after supper.     acetaminophen (TYLENOL) 500 MG tablet Take 500-1,000 mg by mouth every 6 (six) hours as needed for mild pain. 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 Estimated body mass index is 28.5 kg/m as calculated from the following:   Height as of 11/21/22: 5\' 6"  (1.676 m).   Weight as of 11/21/22: 176 lb 9.6 oz (80.1 kg).  EKG (optional): deferred due to virtual visit  GENERAL: alert, oriented, no acute distress detected; full vision exam deferred  due to pandemic and/or virtual encounter  PSYCH/NEURO: pleasant and cooperative, no obvious depression or anxiety, speech and thought processing grossly intact, Cognitive function grossly intact  Flowsheet Row Office Visit from [facility]/12/2021 in [facility]  PHQ-9 Total Score 0           12/21/2022    9:42 AM 12/19/2021    2:14 PM 05/12/2021   10:04 AM 12/16/2020    9:09 AM 12/16/2020    9:05 AM  Depression screen PHQ 2/9  Decreased Interest 0 0 0 0 0  Down, Depressed, Hopeless 0 0 0 0 0  PHQ - 2 Score 0 0 0 0 0  Altered sleeping   0    Tired, decreased energy   0    Change in appetite   0    Feeling bad or failure about yourself    0    Trouble concentrating   0    Moving slowly or fidgety/restless   0    Suicidal thoughts   0    PHQ-9 Score   0    Difficult doing work/chores   Not difficult at all         12/16/2020    9:08 AM 05/12/2021    8:36 AM 06/10/2021    3:00 PM 12/19/2021    2:17 PM 12/21/2022    9:41 AM  Fall Risk  Falls in the past year? 0 0  0 0  Was there an injury with Fall? 0 0  0 0  Fall Risk Category Calculator 0 0  0 0  Fall Risk Category (Retired) Low Low  Low   (RETIRED) Patient Fall Risk Level Low fall risk Low fall risk Low fall risk Low fall risk   Patient at Risk for Falls Due to  No Fall Risks   No Fall Risks  Fall risk Follow up Falls evaluation completed    Falls evaluation completed     SUMMARY AND PLAN:  Encounter for Medicare annual wellness exam   Discussed applicable health maintenance/preventive health measures and advised and referred or ordered per patient preferences: -discussed current recommendations for vaccines due, advised could get at pharmacy if wishes to do Health Maintenance  Topic Date Due   Zoster Vaccines- Shingrix (1 of 2) Never done   COVID-19 Vaccine (4 - 2023-24 season) 05/05/2022   Medicare Annual Wellness (AWV)  12/21/2023   DTaP/Tdap/Td (3 - Td or Tdap) 04/21/2029   Pneumonia Vaccine  35+ Years old  Completed   HPV VACCINES  Aged Out   INFLUENZA VACCINE  Discontinued    Education and counseling on the following was provided based on the above review of health and a plan/checklist for the patient, along with additional information discussed, was provided for the patient in the patient instructions :  -Advised on importance of completing advanced directives, discussed options for completing and provided information in patient instructions as well -Provided counseling and plan for difficulty hearing  -Provided safe balance exercises that can be done at home to improve balance and discussed exercise guidelines for adults with include balance exercises at least 3 days per week.  -Advised and counseled on a healthy lifestyle - including the importance of a healthy diet, regular physical activity -provided info in patient instructions on social connections and stress management. -Reviewed patient's current diet. Advised and counseled on a whole foods based healthy diet. A summary of a healthy diet was provided in the Patient Instructions.  -reviewed patient's current physical activity level and discussed exercise guidelines for adults. Discussed community ideas for safe exercise at home to assist in meeting exercise guideline recommendations in a safe and healthy way.  -Advise yearly dental visits at minimum and regular eye exams -Advised and counseled on alcohol safe limits, risks  Follow up: see patient instructions   Patient Instructions  I really enjoyed getting to talk with you today! I am available on Tuesdays and Thursdays for virtual visits if you have any questions or  concerns, or if I can be of any further assistance.   CHECKLIST FROM ANNUAL WELLNESS VISIT:  -Follow up (please call to schedule if not scheduled after visit):   -yearly for annual wellness visit with primary care office  Here is a list of your preventive care/health maintenance measures and the plan  for each if any are due:  PLAN For any measures below that may be due:  -can get the vaccines at the pharmacy - please let us know if/when you do so that we can update your record  Health Maintenance  Topic Date Due   Zoster Vaccines- Shingrix (1 of 2) Never done   COVID-19 Vaccine (4 - 2023-24 season) 05/05/2022   Medicare Annual Wellness (AWV)  12/21/2023   DTaP/Tdap/Td (3 - Td or Tdap) 04/21/2029   Pneumonia Vaccine 64+ Years old  Completed   HPV VACCINES  Aged Out   INFLUENZA VACCINE  Discontinued    -See a dentist at least yearly  -Get your eyes checked and then per your eye specialist's recommendations  -Other issues addressed today:   -I have included below further information regarding a healthy whole foods based diet, physical activity guidelines for adults, stress management and opportunities for social connections. I hope you find this information useful.   -----------------------------------------------------------------------------------------------------------------------------------------------------------------------------------------------------------------------------------------------------------  NUTRITION: -eat real food: lots of colorful vegetables (half the plate) and fruits -5-7 servings of vegetables and fruits per day (fresh or steamed is best), exp. 2 servings of vegetables with lunch and dinner and 2 servings of fruit per day. Berries and greens such as kale and collards are great choices.  -consume on a regular basis: whole grains (make sure first ingredient on label contains the word "whole"), fresh fruits, fish, nuts, seeds, healthy oils (such as olive oil, avocado oil, grape seed oil) -may eat small amounts of dairy and lean meat on occasion, but avoid processed meats such as ham, bacon, lunch meat, etc. -drink water -try to avoid fast food and pre-packaged foods, processed meat -most experts advise limiting sodium to < 2300mg  per day, should limit  further is any chronic conditions such as high blood pressure, heart disease, diabetes, etc. The American Heart Association advised that < 1500mg  is is ideal -try to avoid foods that contain any ingredients with names you do not recognize  -try to avoid sugar/sweets (except for the natural sugar that occurs in fresh fruit) -try to avoid sweet drinks -try to avoid white rice, white bread, pasta (unless whole grain), white or yellow potatoes  EXERCISE GUIDELINES FOR ADULTS: -if you wish to increase your physical activity, do so gradually and with the approval of your doctor -STOP and seek medical care immediately if you have any chest pain, chest discomfort or trouble breathing when starting or increasing exercise  -move and stretch your body, legs, feet and arms when sitting for long periods -Physical activity guidelines for optimal health in adults: -least 150 minutes per week of aerobic exercise (can talk, but not sing) once approved by your doctor, 20-30 minutes of sustained activity or two 10 minute episodes of sustained activity every day.  -resistance training at least 2 days per week if approved by your doctor -balance exercises 3+ days per week:   Stand somewhere where you have something sturdy to hold onto if you lose balance.    1) lift up on toes, start with 5x per day and work up to 20x   2) stand and lift on leg straight out to the side so  that foot is a few inches of the floor, start with 5x each side and work up to 20x each side   3) stand on one foot, start with 5 seconds each side and work up to 20 seconds on each side  If you need ideas or help with getting more active:  -Silver sneakers https://tools.silversneakers.com  -Walk with a Doc: http://www.duncan-williams.com/  -try to include resistance (weight lifting/strength building) and balance exercises twice per week: or the following link for  ideas: http://castillo-powell.com/  BuyDucts.dk  STRESS MANAGEMENT: -can try meditating, or just sitting quietly with deep breathing while intentionally relaxing all parts of your body for 5 minutes daily -if you need further help with stress, anxiety or depression please follow up with your primary doctor or contact the wonderful folks at [facility]: [phone]  SOCIAL CONNECTIONS: -options in [place] if you wish to engage in more social and exercise related activities:  -Silver sneakers https://tools.silversneakers.com  -Walk with a Doc: http://www.duncan-williams.com/  -Check out the [facility] Active Adults 50+ section on the [place] of [employer] (hiking clubs, book clubs, cards and games, chess, exercise classes, aquatic classes and much more) - see the website for details: https://www.[place].gov/departments/parks-recreation/active-adults50  -YouTube has lots of exercise videos for different ages and abilities as well  -[facility] (a variety of indoor and outdoor inperson activities for adults). [phone]. [address]  -Virtual Online Classes (a variety of topics): see seniorplanet.org or call 240-624-8084  -consider volunteering at a school, hospice center, church, senior center or elsewhere         ADVANCED HEALTHCARE DIRECTIVES:  Everyone should have advanced health care directives in place. This is so that you get the care you want, should you ever be in a situation where you are unable to make your own medical decisions.   From the [place] Advanced Directive Website: "Advance Health Care Directives are legal documents in which you give written instructions about your health care if, in the future, you cannot speak for yourself.   A health care power of attorney allows you to name a person you trust to make your health care  decisions if you cannot make them yourself. A declaration of a desire for a natural death (or living will) is document, which states that you desire not to have your life prolonged by extraordinary measures if you have a terminal or incurable illness or if you are in a vegetative state. An advance instruction for mental health treatment makes a declaration of instructions, information and preferences regarding your mental health treatment. It also states that you are aware that the advance instruction authorizes a mental health treatment provider to act according to your wishes. It may also outline your consent or refusal of mental health treatment. A declaration of an anatomical gift allows anyone over the age of 18 to make a gift by will, organ donor card or other document."   Please see the following website or an elder law attorney for forms, FAQs and for completion of advanced directives: [place] TEFL teacher Health Care Directives Advance Health Care Directives (http://guzman.com/)  Or copy and paste the following to your web browser: PoshChat.fi    Terressa Koyanagi, DO

## 2022-12-24 DIAGNOSIS — J449 Chronic obstructive pulmonary disease, unspecified: Secondary | ICD-10-CM | POA: Diagnosis not present

## 2022-12-27 ENCOUNTER — Ambulatory Visit (INDEPENDENT_AMBULATORY_CARE_PROVIDER_SITE_OTHER): Payer: PPO | Admitting: Internal Medicine

## 2022-12-27 DIAGNOSIS — J449 Chronic obstructive pulmonary disease, unspecified: Secondary | ICD-10-CM | POA: Diagnosis not present

## 2022-12-27 NOTE — Patient Instructions (Signed)
Full PFT performed today. °

## 2022-12-27 NOTE — Progress Notes (Signed)
Full PFT performed today. °

## 2022-12-28 LAB — PULMONARY FUNCTION TEST
DL/VA % pred: 60 %
DL/VA: 2.38 ml/min/mmHg/L
DLCO cor % pred: 49 %
DLCO cor: 10.75 ml/min/mmHg
DLCO unc % pred: 49 %
DLCO unc: 10.75 ml/min/mmHg
FEF 25-75 Post: 0.43 L/sec
FEF 25-75 Pre: 0.39 L/sec
FEF2575-%Change-Post: 10 %
FEF2575-%Pred-Post: 26 %
FEF2575-%Pred-Pre: 24 %
FEV1-%Change-Post: 4 %
FEV1-%Pred-Post: 42 %
FEV1-%Pred-Pre: 40 %
FEV1-Post: 1.01 L
FEV1-Pre: 0.96 L
FEV1FVC-%Change-Post: 1 %
FEV1FVC-%Pred-Pre: 58 %
FEV6-%Change-Post: 3 %
FEV6-%Pred-Post: 71 %
FEV6-%Pred-Pre: 69 %
FEV6-Post: 2.23 L
FEV6-Pre: 2.16 L
FEV6FVC-%Change-Post: 0 %
FEV6FVC-%Pred-Post: 102 %
FEV6FVC-%Pred-Pre: 102 %
FVC-%Change-Post: 3 %
FVC-%Pred-Post: 69 %
FVC-%Pred-Pre: 67 %
FVC-Post: 2.36 L
FVC-Pre: 2.29 L
Post FEV1/FVC ratio: 43 %
Post FEV6/FVC ratio: 94 %
Pre FEV1/FVC ratio: 42 %
Pre FEV6/FVC Ratio: 94 %
RV % pred: 150 %
RV: 3.66 L
TLC % pred: 103 %
TLC: 6.45 L

## 2022-12-30 ENCOUNTER — Other Ambulatory Visit: Payer: Self-pay | Admitting: Family Medicine

## 2022-12-30 DIAGNOSIS — M1A9XX0 Chronic gout, unspecified, without tophus (tophi): Secondary | ICD-10-CM

## 2023-01-03 NOTE — Progress Notes (Unsigned)
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 rec If not revealing will consider going ahead with a cpst - we will call to schedule Try prilosec 20mg   Take 30-60 min before first meal of the day and Pepcid 20 mg one bedtime until  We complete your work up GERD diet      05/23/2022  f/u ov/Marc Flores re: GOLD 3/bronchiectasis maint on breztri   Chief Complaint  Patient presents with   Follow-up    Having worse problems breathing. Pt states he has had a productive cough x2 weeks  Dyspnea:  3 aisles and has to stop/ still walking to mb/shed but stopping more often esp in heat Cough: light beige x 2 weeks, nothing really purulent Sleeping: flat bd 2 pillows  SABA use: ventolin every 6 hours  02: none  Rec Try symbiocrt 80 Take 2 puffs first thing in am and then another 2 puffs about 12 hours later.  Only use your albuterol as a rescue medication      11/21/2022  f/u ov/Marc Flores re: GOLD 3/ bronchiectasis  maint on breztri and freq saba p ex Chief Complaint  Patient presents with   Follow-up    Dyspnea persistent.  Albuterol not helping sx.   Dyspnea:  no change doe  Cough: none  Sleeping: flat bd 2 pillows  SABA use: way too much  02: none  Rec Also  Ok to try albuterol 15 min before an activity (on  alternating days)  that you know would usually make you short of breath    01/04/2023  f/u ov/Marc Flores re: ***   maint on ***  No chief complaint on file.   Dyspnea:  *** Cough: *** Sleeping: *** SABA use: *** 02: *** Covid status:   *** Lung cancer screening :  ***    No obvious day to day or daytime variability or assoc excess/ purulent sputum or mucus plugs or hemoptysis or cp or chest tightness, subjective wheeze or overt sinus or hb symptoms.   *** without nocturnal  or early am exacerbation  of respiratory  c/o's or need for noct saba. Also denies any obvious fluctuation of symptoms with weather or environmental changes or other aggravating or alleviating factors except as outlined above   No unusual exposure hx or h/o childhood pna/ asthma or knowledge of premature birth.  Current Allergies, Complete Past Medical History, Past Surgical History, Family History, and Social History were reviewed in [employer] record.  ROS  The following are not active complaints unless bolded Hoarseness, sore throat, dysphagia, dental problems, itching, sneezing,  nasal congestion or discharge of excess mucus or purulent secretions, ear ache,   fever,  chills, sweats, unintended wt loss or wt gain, classically pleuritic or exertional cp,  orthopnea pnd or arm/hand swelling  or leg swelling, presyncope, palpitations, abdominal pain, anorexia, nausea, vomiting, diarrhea  or change in bowel habits or change in bladder habits, change in stools or change in urine, dysuria, hematuria,  rash, arthralgias, visual complaints, headache, numbness, weakness or ataxia or problems with walking or coordination,  change in mood or  memory.        No outpatient medications have been marked as taking for the 01/04/23 encounter (Appointment) with Nyoka Cowden, MD.                    Objective:   Physical Exam   Wts  01/04/2023           *** 11/21/2022         176  05/23/2022         173   03/27/2022        171   02/06/2022          167   12/15/2021        175  12/03/2020          178  11/17/2019        183 11/13/2018        186  .11/12/2017       178   01/26/2017       176  12/29/16 177 lb (80.3 kg)  12/20/16 176 lb 6.4 oz (80 kg)  12/08/11 179 lb (81.2 kg)     Vital signs reviewed  01/04/2023  - Note at rest 02 sats  ***% on ***   General appearance:    ***    Mild barr***           Assessment & Plan:

## 2023-01-04 ENCOUNTER — Encounter: Payer: Self-pay | Admitting: Internal Medicine

## 2023-01-04 ENCOUNTER — Ambulatory Visit (INDEPENDENT_AMBULATORY_CARE_PROVIDER_SITE_OTHER): Payer: PPO | Admitting: Internal Medicine

## 2023-01-04 VITALS — BP 132/72 | HR 74 | Temp 97.6°F | Ht 66.0 in | Wt 174.2 lb

## 2023-01-04 DIAGNOSIS — R0902 Hypoxemia: Secondary | ICD-10-CM | POA: Diagnosis not present

## 2023-01-04 DIAGNOSIS — J449 Chronic obstructive pulmonary disease, unspecified: Secondary | ICD-10-CM

## 2023-01-04 NOTE — Assessment & Plan Note (Signed)
Quit smoking 1992/MM - Spirometry 06/18/11 FEV1 1.27 (52%) ratio 40  - Spirometry 12/29/2016  FEV1 0.78 (34%)  Ratio 37  No saba first  - 12/29/2016   trial of bevespi 2bid  - alpha one AT screen 12/29/16 >  MM - Allergy profile  12/29/16  >    IgE  16  RAST neg   - 11/12/2017  After extensive coaching inhaler device  effectiveness =    90% with dpi > try trelegy if insurance covers - 11/13/2018 d/c trelegy trial basis as not sure it's helping > worse sob so restarted bevespi - 11/17/2019    try breztri 2bid > does remember  - 12/03/2020  After extensive coaching inhaler device,  effectiveness =    90% > rechallenge with Markus Daft - 12/15/2021 trained on Flutter valve and rx pred x 6 days only  - Spirometry 02/06/2022  FEV1 0.8 (35%)  Ratio 0.44 p breztri with classic concave f/v  - see phone message 05/17/22 re limited alternatives for copd meds on his formulary - can't afford breztri  -  11/21/2022 new dx ex hypoxemia  > see sep a/p -  PFT's  12/27/22 FEV1 1.01 (42 % ) ratio 0.43  p 1 % improvement from saba p Breztri prior to study with DLCO  10.75 (49%)   and FV curve classic with air trapping  - 01/04/2023 referred to rehab (02 not helping ex tol and weighs too much    Group D (now reclassified as E) in terms of symptom/risk and laba/lama/ICS  therefore appropriate rx at this point >>>  breztri 2bid / has found saba not helping ex tol so stopped using it altogether and good candidate for rehab > referred

## 2023-01-04 NOTE — Patient Instructions (Signed)
My office will be contacting you by phone for referral to pulmonary rehab   - if you don't hear back from my office within one week please call us back or notify us thru MyChart and we'll address it right away.    Please schedule a follow up visit in 6  months but call sooner if needed

## 2023-01-04 NOTE — Assessment & Plan Note (Signed)
  11/21/2022  patient walked at moderate pace. patient was able to complete 2 laps on room air. c/o SOB and dyspnea. Patient put on 3 lpm oxygen POC pulsed. SaO2 at 94%. Patient was able to complete 3rd lap without difficulty. SaO2 at 94%. No c/o dyspnea or SOB noted.  Rec 3lpm POC  >  pt d/c'd 01/04/2023 as too heavy, no better with 02 than s it > referred to rehab        F/u q 6 m, sooner prn   Each maintenance medication was reviewed in detail including emphasizing most importantly the difference between maintenance and prns and under what circumstances the prns are to be triggered using an action plan format where appropriate.  Total time for H and P, chart review, counseling, reviewing hfa/ 02 device(s) and generating customized AVS unique to this office visit / same day charting = 20 min

## 2023-01-17 ENCOUNTER — Encounter: Payer: Self-pay | Admitting: Internal Medicine

## 2023-01-23 DIAGNOSIS — J449 Chronic obstructive pulmonary disease, unspecified: Secondary | ICD-10-CM | POA: Diagnosis not present

## 2023-01-26 ENCOUNTER — Encounter: Payer: Self-pay | Admitting: Internal Medicine

## 2023-01-26 ENCOUNTER — Other Ambulatory Visit: Payer: Self-pay | Admitting: Internal Medicine

## 2023-01-26 DIAGNOSIS — R0609 Other forms of dyspnea: Secondary | ICD-10-CM

## 2023-01-30 ENCOUNTER — Encounter: Payer: Self-pay | Admitting: Internal Medicine

## 2023-01-31 ENCOUNTER — Other Ambulatory Visit: Payer: Self-pay

## 2023-01-31 DIAGNOSIS — R0609 Other forms of dyspnea: Secondary | ICD-10-CM

## 2023-01-31 DIAGNOSIS — J449 Chronic obstructive pulmonary disease, unspecified: Secondary | ICD-10-CM

## 2023-01-31 NOTE — Telephone Encounter (Unsigned)
Ok to d/c all 02  

## 2023-02-09 ENCOUNTER — Encounter: Payer: Self-pay | Admitting: Internal Medicine

## 2023-02-09 DIAGNOSIS — R0609 Other forms of dyspnea: Secondary | ICD-10-CM

## 2023-02-13 MED ORDER — FAMOTIDINE 20 MG PO TABS
ORAL_TABLET | ORAL | 11 refills | Status: AC
Start: 2023-02-13 — End: ?

## 2023-03-28 ENCOUNTER — Other Ambulatory Visit: Payer: Self-pay | Admitting: Family Medicine

## 2023-04-26 ENCOUNTER — Other Ambulatory Visit: Payer: Self-pay | Admitting: Internal Medicine

## 2023-04-26 DIAGNOSIS — R0609 Other forms of dyspnea: Secondary | ICD-10-CM

## 2023-05-03 DIAGNOSIS — R350 Frequency of micturition: Secondary | ICD-10-CM | POA: Diagnosis not present

## 2023-05-03 DIAGNOSIS — D414 Neoplasm of uncertain behavior of bladder: Secondary | ICD-10-CM | POA: Diagnosis not present

## 2023-05-03 DIAGNOSIS — N401 Enlarged prostate with lower urinary tract symptoms: Secondary | ICD-10-CM | POA: Diagnosis not present

## 2023-05-03 DIAGNOSIS — N130 Hydronephrosis with ureteropelvic junction obstruction: Secondary | ICD-10-CM | POA: Diagnosis not present

## 2023-06-29 ENCOUNTER — Encounter (HOSPITAL_BASED_OUTPATIENT_CLINIC_OR_DEPARTMENT_OTHER): Payer: Self-pay

## 2023-06-29 DIAGNOSIS — G4733 Obstructive sleep apnea (adult) (pediatric): Secondary | ICD-10-CM

## 2023-07-05 DIAGNOSIS — N130 Hydronephrosis with ureteropelvic junction obstruction: Secondary | ICD-10-CM | POA: Diagnosis not present

## 2023-07-05 DIAGNOSIS — D414 Neoplasm of uncertain behavior of bladder: Secondary | ICD-10-CM | POA: Diagnosis not present

## 2023-07-11 ENCOUNTER — Ambulatory Visit (HOSPITAL_BASED_OUTPATIENT_CLINIC_OR_DEPARTMENT_OTHER): Payer: No Typology Code available for payment source | Attending: Physician Assistant | Admitting: Internal Medicine

## 2023-07-11 VITALS — Ht 66.0 in | Wt 175.0 lb

## 2023-07-11 DIAGNOSIS — G4733 Obstructive sleep apnea (adult) (pediatric): Secondary | ICD-10-CM | POA: Diagnosis present

## 2023-07-11 DIAGNOSIS — J449 Chronic obstructive pulmonary disease, unspecified: Secondary | ICD-10-CM | POA: Insufficient documentation

## 2023-07-11 DIAGNOSIS — G4736 Sleep related hypoventilation in conditions classified elsewhere: Secondary | ICD-10-CM | POA: Diagnosis not present

## 2023-07-15 DIAGNOSIS — G4733 Obstructive sleep apnea (adult) (pediatric): Secondary | ICD-10-CM

## 2023-07-15 NOTE — Procedures (Signed)
     Patient Name: Marc Flores, Marc Flores Date: 07/11/2023 Gender: Male D.O.B: 10/14/1940 Age (years): 33 Referring Provider: Nada Boozer Height (inches): 66 Interpreting Physician: Jetty Duhamel MD, ABSM Weight (lbs): 175 RPSGT: Shelah Lewandowsky BMI: 28 MRN: [id number] Neck Size: 15.50  CLINICAL INFORMATION Sleep Study Type: NPSG Indication for sleep study: COPD Epworth Sleepiness Score: 4  SLEEP STUDY TECHNIQUE As per the AASM Manual for the Scoring of Sleep and Associated Events v2.3 (April 2016) with a hypopnea requiring 4% desaturations.  The channels recorded and monitored were frontal, central and occipital EEG, electrooculogram (EOG), submentalis EMG (chin), nasal and oral airflow, thoracic and abdominal wall motion, anterior tibialis EMG, snore microphone, electrocardiogram, and pulse oximetry.  MEDICATIONS Medications self-administered by patient taken the night of the study : none reported  SLEEP ARCHITECTURE The study was initiated at 10:37:18 PM and ended at 5:02:11 AM.  Sleep onset time was 16.8 minutes and the sleep efficiency was 70.4%. The total sleep time was 271 minutes.  Stage REM latency was 193.0 minutes.  The patient spent 16.8% of the night in stage N1 sleep, 75.5% in stage N2 sleep, 0.0% in stage N3 and 7.8% in REM.  Alpha intrusion was absent.  Supine sleep was 19.93%.  RESPIRATORY PARAMETERS The overall apnea/hypopnea index (AHI) was 0.0 per hour. There were 0 total apneas, including 0 obstructive, 0 central and 0 mixed apneas. There were 0 hypopneas and 10 RERAs.  The AHI during Stage REM sleep was 0.0 per hour.  AHI while supine was 0.0 per hour.  The mean oxygen saturation was 92.4%. The minimum SpO2 during sleep was 86.0%.  Intermittent soft snoring was noted during this study.  CARDIAC DATA The 2 lead EKG demonstrated sinus rhythm. The mean heart rate was 68.0 beats per minute. Other EKG findings include: PVCs.  LEG MOVEMENT  DATA The total PLMS were 0 with a resulting PLMS index of 0.0. Associated arousal with leg movement index was 2.2 .  IMPRESSIONS - No significant obstructive sleep apnea occurred during this study (AHI = 0.0/h). - Oxygen desaturation was noted during this study (Min O2 = 86.0%). Supplemental O2, 1L, was added at 2341 per protocol for saturation 88% or less.  Subsequent O2 saturation ranged 88-92%. - Soft Intermittent snoring was audible during this study. - EKG findings include PVCs. - Clinically significant periodic limb movements did not occur during sleep. No significant associated arousals.  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 - Sleep hygiene should be reviewed to assess factors that may improve sleep quality. - Weight management and regular exercise should be initiated or continued if appropriate.  [Electronically signed] 07/15/2023 11:40 AM  Jetty Duhamel MD, ABSM Diplomate, American Board of Sleep Medicine NPI: [id number]                        Jetty Duhamel Diplomate, American Board of Sleep Medicine  ELECTRONICALLY SIGNED ON:  07/15/2023, 11:34 AM [facility] PH: [phone]   FX: [phone] ACCREDITED BY THE AMERICAN ACADEMY OF SLEEP MEDICINE

## 2023-07-27 ENCOUNTER — Ambulatory Visit: Payer: PPO | Admitting: Internal Medicine

## 2023-07-30 ENCOUNTER — Ambulatory Visit: Payer: PPO | Admitting: Internal Medicine

## 2023-09-23 ENCOUNTER — Encounter (HOSPITAL_COMMUNITY): Payer: Self-pay

## 2023-09-23 ENCOUNTER — Observation Stay (HOSPITAL_COMMUNITY)
Admission: EM | Admit: 2023-09-23 | Discharge: 2023-09-24 | Disposition: A | Discharge status: Home / Self Care | Payer: No Typology Code available for payment source | Attending: Internal Medicine | Admitting: Internal Medicine

## 2023-09-23 ENCOUNTER — Emergency Department (HOSPITAL_COMMUNITY): Payer: No Typology Code available for payment source

## 2023-09-23 ENCOUNTER — Other Ambulatory Visit: Payer: Self-pay

## 2023-09-23 DIAGNOSIS — N400 Enlarged prostate without lower urinary tract symptoms: Secondary | ICD-10-CM | POA: Diagnosis not present

## 2023-09-23 DIAGNOSIS — G4733 Obstructive sleep apnea (adult) (pediatric): Secondary | ICD-10-CM | POA: Diagnosis not present

## 2023-09-23 DIAGNOSIS — R000 Tachycardia, unspecified: Secondary | ICD-10-CM | POA: Diagnosis not present

## 2023-09-23 DIAGNOSIS — E785 Hyperlipidemia, unspecified: Secondary | ICD-10-CM | POA: Diagnosis not present

## 2023-09-23 DIAGNOSIS — J9601 Acute respiratory failure with hypoxia: Secondary | ICD-10-CM | POA: Insufficient documentation

## 2023-09-23 DIAGNOSIS — Z20822 Contact with and (suspected) exposure to covid-19: Secondary | ICD-10-CM | POA: Insufficient documentation

## 2023-09-23 DIAGNOSIS — K219 Gastro-esophageal reflux disease without esophagitis: Secondary | ICD-10-CM | POA: Diagnosis present

## 2023-09-23 DIAGNOSIS — Z87891 Personal history of nicotine dependence: Secondary | ICD-10-CM | POA: Insufficient documentation

## 2023-09-23 DIAGNOSIS — R062 Wheezing: Secondary | ICD-10-CM | POA: Diagnosis not present

## 2023-09-23 DIAGNOSIS — Z79899 Other long term (current) drug therapy: Secondary | ICD-10-CM | POA: Diagnosis not present

## 2023-09-23 DIAGNOSIS — M109 Gout, unspecified: Secondary | ICD-10-CM | POA: Diagnosis not present

## 2023-09-23 DIAGNOSIS — R001 Bradycardia, unspecified: Secondary | ICD-10-CM | POA: Diagnosis not present

## 2023-09-23 DIAGNOSIS — M1A9XX0 Chronic gout, unspecified, without tophus (tophi): Secondary | ICD-10-CM

## 2023-09-23 DIAGNOSIS — R0602 Shortness of breath: Secondary | ICD-10-CM | POA: Diagnosis present

## 2023-09-23 DIAGNOSIS — R4702 Dysphasia: Secondary | ICD-10-CM | POA: Diagnosis not present

## 2023-09-23 DIAGNOSIS — Z8551 Personal history of malignant neoplasm of bladder: Secondary | ICD-10-CM | POA: Diagnosis not present

## 2023-09-23 DIAGNOSIS — I10 Essential (primary) hypertension: Secondary | ICD-10-CM | POA: Diagnosis not present

## 2023-09-23 DIAGNOSIS — J441 Chronic obstructive pulmonary disease with (acute) exacerbation: Secondary | ICD-10-CM | POA: Diagnosis not present

## 2023-09-23 LAB — CBC WITH DIFFERENTIAL/PLATELET
Abs Immature Granulocytes: 0.02 10*3/uL (ref 0.00–0.07)
Basophils Absolute: 0.1 10*3/uL (ref 0.0–0.1)
Basophils Relative: 1 %
Eosinophils Absolute: 0.7 10*3/uL — ABNORMAL HIGH (ref 0.0–0.5)
Eosinophils Relative: 8 %
HCT: 55 % — ABNORMAL HIGH (ref 39.0–52.0)
Hemoglobin: 18.3 g/dL — ABNORMAL HIGH (ref 13.0–17.0)
Immature Granulocytes: 0 %
Lymphocytes Relative: 35 %
Lymphs Abs: 3.2 10*3/uL (ref 0.7–4.0)
MCH: 30.2 pg (ref 26.0–34.0)
MCHC: 33.3 g/dL (ref 30.0–36.0)
MCV: 90.9 fL (ref 80.0–100.0)
Monocytes Absolute: 0.9 10*3/uL (ref 0.1–1.0)
Monocytes Relative: 10 %
Neutro Abs: 4.2 10*3/uL (ref 1.7–7.7)
Neutrophils Relative %: 46 %
Platelets: 270 10*3/uL (ref 150–400)
RBC: 6.05 MIL/uL — ABNORMAL HIGH (ref 4.22–5.81)
RDW: 13.3 % (ref 11.5–15.5)
WBC: 9.2 10*3/uL (ref 4.0–10.5)
nRBC: 0 % (ref 0.0–0.2)

## 2023-09-23 LAB — RESP PANEL BY RT-PCR (RSV, FLU A&B, COVID)  RVPGX2
Influenza A by PCR: NEGATIVE
Influenza B by PCR: NEGATIVE
Resp Syncytial Virus by PCR: NEGATIVE
SARS Coronavirus 2 by RT PCR: NEGATIVE

## 2023-09-23 LAB — COMPREHENSIVE METABOLIC PANEL
ALT: 24 U/L (ref 0–44)
AST: 28 U/L (ref 15–41)
Albumin: 4.4 g/dL (ref 3.5–5.0)
Alkaline Phosphatase: 97 U/L (ref 38–126)
Anion gap: 14 (ref 5–15)
BUN: 14 mg/dL (ref 8–23)
CO2: 25 mmol/L (ref 22–32)
Calcium: 9.5 mg/dL (ref 8.9–10.3)
Chloride: 99 mmol/L (ref 98–111)
Creatinine, Ser: 1.46 mg/dL — ABNORMAL HIGH (ref 0.61–1.24)
GFR, Estimated: 48 mL/min — ABNORMAL LOW (ref >60)
Glucose, Bld: 112 mg/dL — ABNORMAL HIGH (ref 70–99)
Potassium: 4.5 mmol/L (ref 3.5–5.1)
Sodium: 138 mmol/L (ref 135–145)
Total Bilirubin: 2.1 mg/dL — ABNORMAL HIGH (ref 0.0–1.2)
Total Protein: 7.4 g/dL (ref 6.5–8.1)

## 2023-09-23 LAB — BRAIN NATRIURETIC PEPTIDE: B Natriuretic Peptide: 26.1 pg/mL (ref 0.0–100.0)

## 2023-09-23 LAB — D-DIMER, QUANTITATIVE: D-Dimer, Quant: <0.27 ug{FEU}/mL (ref 0.00–0.50)

## 2023-09-23 LAB — TROPONIN I (HIGH SENSITIVITY): Troponin I (High Sensitivity): 7 ng/L (ref <18)

## 2023-09-23 MED ORDER — PREDNISONE 20 MG PO TABS
40.0000 mg | ORAL_TABLET | Freq: Every day | ORAL | Status: DC
  Administered 2023-09-24: 40 mg via ORAL
  Filled 2023-09-23: qty 2

## 2023-09-23 MED ORDER — ALBUTEROL SULFATE (2.5 MG/3ML) 0.083% IN NEBU
2.5000 mg | INHALATION_SOLUTION | Freq: Once | RESPIRATORY_TRACT | Status: AC
  Administered 2023-09-23: 2.5 mg via RESPIRATORY_TRACT
  Filled 2023-09-23: qty 3

## 2023-09-23 MED ORDER — PANTOPRAZOLE SODIUM 40 MG PO TBEC
40.0000 mg | DELAYED_RELEASE_TABLET | Freq: Every day | ORAL | Status: DC
  Administered 2023-09-24: 40 mg via ORAL
  Filled 2023-09-23: qty 2

## 2023-09-23 MED ORDER — IPRATROPIUM-ALBUTEROL 0.5-2.5 (3) MG/3ML IN SOLN
3.0000 mL | Freq: Once | RESPIRATORY_TRACT | Status: AC
  Administered 2023-09-23: 3 mL via RESPIRATORY_TRACT
  Filled 2023-09-23: qty 3

## 2023-09-23 MED ORDER — ONDANSETRON HCL 4 MG/2ML IJ SOLN
4.0000 mg | Freq: Once | INTRAMUSCULAR | Status: AC
  Administered 2023-09-23: 4 mg via INTRAVENOUS
  Filled 2023-09-23: qty 2

## 2023-09-23 MED ORDER — TAMSULOSIN HCL 0.4 MG PO CAPS
0.4000 mg | ORAL_CAPSULE | Freq: Every day | ORAL | Status: DC
  Administered 2023-09-23: 0.4 mg via ORAL
  Filled 2023-09-23: qty 1

## 2023-09-23 MED ORDER — ACETAMINOPHEN 325 MG PO TABS: 650.0000 mg | ORAL_TABLET | Freq: Four times a day (QID) | ORAL | Status: DC | PRN

## 2023-09-23 MED ORDER — ATORVASTATIN CALCIUM 10 MG PO TABS
10.0000 mg | ORAL_TABLET | Freq: Every day | ORAL | Status: DC
Start: 2023-09-24 — End: 2023-09-24
  Administered 2023-09-24: 10 mg via ORAL
  Filled 2023-09-23: qty 1

## 2023-09-23 MED ORDER — MOMETASONE FURO-FORMOTEROL FUM 100-5 MCG/ACT IN AERO
2.0000 | INHALATION_SPRAY | Freq: Two times a day (BID) | RESPIRATORY_TRACT | Status: DC
  Administered 2023-09-23 – 2023-09-24 (×2): 2 via RESPIRATORY_TRACT
  Filled 2023-09-23: qty 8.8

## 2023-09-23 MED ORDER — ENOXAPARIN SODIUM 40 MG/0.4ML IJ SOSY
40.0000 mg | PREFILLED_SYRINGE | INTRAMUSCULAR | Status: DC
  Administered 2023-09-23: 40 mg via SUBCUTANEOUS
  Filled 2023-09-23: qty 0.4

## 2023-09-23 MED ORDER — BUDESON-GLYCOPYRROL-FORMOTEROL 160-9-4.8 MCG/ACT IN AERO: 2.0000 | INHALATION_SPRAY | Freq: Two times a day (BID) | RESPIRATORY_TRACT | Status: DC

## 2023-09-23 MED ORDER — POLYETHYLENE GLYCOL 3350 17 G PO PACK: 17.0000 g | PACK | Freq: Every day | ORAL | Status: DC | PRN

## 2023-09-23 MED ORDER — ALBUTEROL SULFATE (2.5 MG/3ML) 0.083% IN NEBU: 2.5000 mg | INHALATION_SOLUTION | RESPIRATORY_TRACT | Status: DC | PRN

## 2023-09-23 MED ORDER — ACETAMINOPHEN 650 MG RE SUPP: 650.0000 mg | Freq: Four times a day (QID) | RECTAL | Status: DC | PRN

## 2023-09-23 MED ORDER — IPRATROPIUM BROMIDE 0.02 % IN SOLN
0.5000 mg | Freq: Four times a day (QID) | RESPIRATORY_TRACT | Status: DC
  Administered 2023-09-23 – 2023-09-24 (×4): 0.5 mg via RESPIRATORY_TRACT
  Filled 2023-09-23 (×4): qty 2.5

## 2023-09-23 MED ORDER — SODIUM CHLORIDE 0.9% FLUSH
3.0000 mL | Freq: Two times a day (BID) | INTRAVENOUS | Status: DC
  Administered 2023-09-23 – 2023-09-24 (×3): 3 mL via INTRAVENOUS

## 2023-09-23 NOTE — H&P (Signed)
History and Physical   Marc Flores QMV:[id number] DOB: 08-23-1941 DOA: 09/23/2023  PCP: Nelwyn Salisbury, MD   Patient coming from: Home  Chief Complaint: Shortness of breath  HPI: Marc Flores is a 83 y.o. male with medical history significant of hyperlipidemia, gout, COPD, pancreatic cyst, BPH, GERD, OSA presenting with worsening shortness of breath.  Patient reports several days of worsening shortness of breath.  Has been taking his Hailer at home with some relief but he ran out of it.  Called EMS and on fire department arrival he was saturating in the 80s on room air.  Has improved to the 90s after treatment from EMS including Solu-Medrol, Atrovent, albuterol.  Reports feels similar to previous COPD exacerbations but much more severe than he has had before.  Denies fevers, chills, chest pain, abdominal pain, constipation, diarrhea, nausea, vomiting.  ED Course: Vital signs in the ED notable for heart rate in the 100s, blood pressure in the 110s systolic, respirate in the 20s, able to be weaned to room air at rest.  Lab workup included CMP with creatinine of 1.46 which is stable, glucose 112, T. bili 2.1.  Chronic erythrocytosis at 18.3.  BNP normal.  Troponin normal.  D-dimer negative.  Respiratory panel negative for flu COVID and RSV.  Chest x-ray showed no acute normality.  Patient is a DuoNebs, albuterol, Zofran in the ED (in addition to treatments by EMS).  Review of Systems: As per HPI otherwise all other systems reviewed and are negative.  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 Sherene Sires     Diverticulitis    Dyspnea    ON EXERTION   History of chickenpox    Hyperlipidemia     Past Surgical History:  Procedure Laterality Date   COLONOSCOPY  04/13/2020   per Dr. Jeani Hawking, adenomaotus polyps, repeat in 7 yrs    CYSTOSCOPY   11/27/2019   IN [facility]   CYSTOSCOPY W/ RETROGRADES Bilateral 12/18/2019   Procedure: CYSTOSCOPY WITH RETROGRADE PYELOGRAM;  Surgeon: Marcine Matar, MD;  Location[facility];  Service: Urology;  Laterality: Bilateral;   TONSILLECTOMY  AS CHILD   TRANSURETHRAL RESECTION OF BLADDER TUMOR WITH MITOMYCIN-C N/A 12/18/2019   Procedure: TRANSURETHRAL RESECTION OF BLADDER TUMOR, RIGHT URETEROSCOPY AND RIGHT URETERAL STENT PLACEMENT;  Surgeon: Marcine Matar, MD;  Location: [facility];  Service: Urology;  Laterality: N/A;   WISDOM TOOTH EXTRACTION  AS CHILD    Social History  reports that he quit smoking about 31 years ago. His smoking use included cigarettes. He started smoking about 61 years ago. He has a 30 pack-year smoking history. He has been exposed to tobacco smoke. He has never used smokeless tobacco. He reports current alcohol use. He reports that he does not use drugs.  No Known Allergies  Family History  Problem Relation Age of Onset   Glaucoma Mother    Diabetes Mother    Hearing loss Mother    Heart disease Mother    Heart disease Father    Atrial fibrillation Father    Arthritis Father    Cancer Father    Hearing loss Father    Hypertension Father   Reviewed on admission  Prior to Admission medications   Medication Sig Start Date End Date Taking? Authorizing Provider  acetaminophen (TYLENOL) 500 MG tablet Take 500-1,000 mg by mouth every 6 (six) hours as needed for mild pain.    [provider]  albuterol (VENTOLIN HFA) 108 (90 Base) MCG/ACT inhaler Inhale 2 puffs into the lungs every 6 (six) hours as needed.    [provider]  atorvastatin (LIPITOR) 10 MG tablet TAKE 1 TABLET(10 MG) BY MOUTH DAILY 03/29/23   Nelwyn Salisbury, MD  Budeson-Glycopyrrol-Formoterol (BREZTRI AEROSPHERE) 160-9-4.8 MCG/ACT AERO 2 puffs in morning 2 puffs about 12 hours later 09/15/22   Nyoka Cowden, MD  colchicine 0.6 MG tablet TAKE  2 TABLETS BY MOUTH TWICE DAILY AS NEEDED FOR GOUT FLARE 01/01/23   Nelwyn Salisbury, MD  famotidine (PEPCID) 20 MG tablet One after supper 02/13/23   Nyoka Cowden, MD  pantoprazole (PROTONIX) 40 MG tablet TAKE 1 TABLET(40 MG) BY MOUTH DAILY 30 TO 60 MINUTES BEFORE FIRST MEAL OF THE DAY 04/30/23   Nyoka Cowden, MD  tamsulosin (FLOMAX) 0.4 MG CAPS capsule Take 0.4 mg by mouth daily after supper. 08/21/18   [provider]    Physical Exam: Vitals:   09/23/23 1005 09/23/23 1006 09/23/23 1051  BP:  (!) 113/101   Pulse: (!) 107    Resp: (!) 22    Temp: 98.4 F (36.9 C)    TempSrc: Oral    SpO2: 94%  96%  Weight: 80 kg    Height: 5\' 6"  (1.676 m)      Physical Exam Constitutional:      General: He is not in acute distress.    Appearance: Normal appearance.  HENT:     Head: Normocephalic and atraumatic.     Mouth/Throat:     Mouth: Mucous membranes are moist.     Pharynx: Oropharynx is clear.  Eyes:     Extraocular Movements: Extraocular movements intact.     Pupils: Pupils are equal, round, and reactive to light.  Cardiovascular:     Rate and Rhythm: Regular rhythm. Tachycardia present.     Pulses: Normal pulses.     Heart sounds: Normal heart sounds.  Pulmonary:     Effort: Pulmonary effort is normal. No respiratory distress.     Breath sounds: Wheezing present.  Abdominal:     General: Bowel sounds are normal. There is no distension.     Palpations: Abdomen is soft.     Tenderness: There is no abdominal tenderness.  Musculoskeletal:        General: No swelling or deformity.  Skin:    General: Skin is warm and dry.  Neurological:     General: No focal deficit present.     Mental Status: Mental status is at baseline.    Labs on Admission: I have personally reviewed following labs and imaging studies  CBC: Recent Labs  Lab 09/23/23 1027  WBC 9.2  NEUTROABS 4.2  HGB 18.3*  HCT 55.0*  MCV 90.9  PLT 270    Basic Metabolic Panel: Recent Labs  Lab  09/23/23 1027  NA 138  K 4.5  CL 99  CO2 25  GLUCOSE 112*  BUN 14  CREATININE 1.46*  CALCIUM 9.5    GFR: Estimated Creatinine Clearance: 40.1 mL/min (A) (by C-G formula based on SCr of 1.46 mg/dL (H)).  Liver Function Tests: Recent Labs  Lab 09/23/23 1027  AST 28  ALT 24  ALKPHOS 97  BILITOT 2.1*  PROT 7.4  ALBUMIN 4.4    Urine analysis: No results found for: "COLORURINE", "APPEARANCEUR", "  LABSPEC", "PHURINE", "GLUCOSEU", "HGBUR", "BILIRUBINUR", "KETONESUR", "PROTEINUR", "UROBILINOGEN", "NITRITE", "LEUKOCYTESUR"  Radiological Exams on Admission: DG Chest Portable 1 View Result Date: 09/23/2023 CLINICAL DATA:  Dyspnea. EXAM: PORTABLE CHEST 1 VIEW COMPARISON:  02/06/2022. FINDINGS: Cardiac silhouette is normal in size. No mediastinal or hilar masses. Lungs are hyperexpanded. There are prominent interstitial markings in the lower lungs. Small area scarring in the left upper lobe lingula. These findings are stable. No evidence of pneumonia or pulmonary edema. Relative lucency in the upper lungs is consistent with emphysema. No pleural effusion or pneumothorax. Skeletal structures are grossly intact. IMPRESSION: 1. No acute cardiopulmonary disease. 2. Emphysema. Electronically Signed   By: Amie Portland M.D.   On: 09/23/2023 10:36   EKG: Independently reviewed. Tachycardia at 129 bpm.  Significant baseline wander heart rate 3 EKG.  Some baseline wander in all leads.  Minimal baseline artifact.  Low voltage multiple leads.  Nonspecific T wave changes difficult to interpret with baseline wander.  Assessment/Plan Principal Problem:   COPD exacerbation (HCC) Active Problems:   Gout   Dyslipidemia   OSA (obstructive sleep apnea)   Benign prostatic hyperplasia without urinary obstruction   GERD (gastroesophageal reflux disease)   COPD exacerbation > 3 days of worsening shortness of breath.  Did run out of home inhaler, Spiolto.  Current regimen is Spiolto and Asmanex through the  [place]. Albuterol and Duoneb Rx PRN. > Initially found to be hypoxic by emergency services, has been able to be weaned to room air at rest. > EMS administered Solu-Medrol and multiple nebulizer treatments including Atrovent and albuterol.  In the ED he received additional DuoNeb albuterol. > Continues to wheeze but as above has been weaned to room air at rest.  Similar to previous COPD exacerbation but much more severe per patient. > Chest x-ray without acute abnormality.  No leukocytosis.  Negative for flu COVID RSV. - Monitor on telemetry with continuous pulse ox overnight - Continue with daily steroids - Scheduled Atrovent - As needed albuterol - Check full RVP for possible viral trigger - Replace home regimen with formulary Dulera for now  Hyperlipidemia - Continue atorvastatin  GERD - Continue PPI  BPH - Continue tamsulosin  Gout - Takes colchicine as needed   DVT prophylaxis: Lovenox Code Status:   Full Family Communication:  None on admission  Disposition Plan:   Patient is from:  Home  Anticipated DC to:  Home  Anticipated DC date:  1 to 3 days  Anticipated DC barriers: None  Consults called:  None Admission status:  Observation, telemetry  Severity of Illness: The appropriate patient status for this patient is OBSERVATION. Observation status is judged to be reasonable and necessary in order to provide the required intensity of service to ensure the patient's safety. The patient's presenting symptoms, physical exam findings, and initial radiographic and laboratory data in the context of their medical condition is felt to place them at decreased risk for further clinical deterioration. Furthermore, it is anticipated that the patient will be medically stable for discharge from the hospital within 2 midnights of admission.    Synetta Fail MD [facility]  How to contact the [facility] Attending or Consulting provider 7A - 7P or covering provider during after hours 7P  -7A, for this patient?   Check the care team in [facility] and look for a) attending/consulting [facility] provider listed and b) the [facility] team listed Log into [email or web address] and use [place]'s universal password to access. If you do not have the password,  please contact the hospital operator. Locate the [facility] provider you are looking for under [facility] and page to a number that you can be directly reached. If you still have difficulty reaching the provider, please page the [facility] (Director on Call) for the Hospitalists listed on amion for assistance.  09/23/2023, 1:44 PM

## 2023-09-23 NOTE — ED Notes (Signed)
CCMD notified of pt.

## 2023-09-23 NOTE — ED Provider Notes (Signed)
[place] EMERGENCY DEPARTMENT AT [facility] Provider Note   CSN: [id number] Arrival date & time: 09/23/23  1001     History  Chief Complaint  Patient presents with   Shortness of Breath    Marc Flores is a 83 y.o. male.  HPI 83 year old male with a history of COPD presents with worsening shortness of breath. Symptoms started 3 days ago. For about 1 week he's had congestion. No cough, sore throat, fever or chest pain. However his dyspnea is getting worse despite albuterol at home. Mostly had dyspnea with any type of movement/walking. No leg swelling, recent travel, calf pain.  EMS gave patient multiple albuterol/atrovent treatments and IV solumedrol and he's feeling better but not all the way better. This originally started like a COPD problem but is worse than he's ever had it.  Home Medications Prior to Admission medications   Medication Sig Start Date End Date Taking? Authorizing Provider  acetaminophen (TYLENOL) 500 MG tablet Take 500-1,000 mg by mouth every 6 (six) hours as needed for mild pain.   Yes [provider]  albuterol (VENTOLIN HFA) 108 (90 Base) MCG/ACT inhaler Inhale 2 puffs into the lungs every 6 (six) hours as needed for shortness of breath or wheezing.   Yes [provider]  atorvastatin (LIPITOR) 10 MG tablet TAKE 1 TABLET(10 MG) BY MOUTH DAILY 03/29/23  Yes Nelwyn Salisbury, MD  cholecalciferol (VITAMIN D3) 25 MCG (1000 UNIT) tablet Take 1,000 Units by mouth daily.   Yes [provider]  colchicine 0.6 MG tablet TAKE 2 TABLETS BY MOUTH TWICE DAILY AS NEEDED FOR GOUT FLARE 01/01/23  Yes Nelwyn Salisbury, MD  famotidine (PEPCID) 20 MG tablet One after supper Patient taking differently: Take 20 mg by mouth daily after breakfast. 02/13/23  Yes Nyoka Cowden, MD  ipratropium-albuterol (DUONEB) 0.5-2.5 (3) MG/3ML SOLN Take 3 mLs by nebulization 2 (two) times daily. 05/31/23  Yes [provider]  mometasone (ASMANEX) 220  MCG/ACT inhaler Inhale 2 puffs into the lungs at bedtime. 07/12/23  Yes [provider]  montelukast (SINGULAIR) 10 MG tablet Take 10 mg by mouth daily. 05/31/23  Yes [provider]  pantoprazole (PROTONIX) 40 MG tablet TAKE 1 TABLET(40 MG) BY MOUTH DAILY 30 TO 60 MINUTES BEFORE FIRST MEAL OF THE DAY 04/30/23  Yes Nyoka Cowden, MD  tamsulosin (FLOMAX) 0.4 MG CAPS capsule Take 0.4 mg by mouth daily after supper. 08/21/18  Yes [provider]  Tiotropium Bromide-Olodaterol 2.5-2.5 MCG/ACT AERS Take 2 puffs by mouth daily. 07/12/23  Yes [provider]  Budeson-Glycopyrrol-Formoterol (BREZTRI AEROSPHERE) 160-9-4.8 MCG/ACT AERO 2 puffs in morning 2 puffs about 12 hours later Patient not taking: Reported on 09/23/2023 09/15/22   Nyoka Cowden, MD      Allergies    Patient has no known allergies.    Review of Systems   Review of Systems  Constitutional:  Negative for fever.  HENT:  Negative for sore throat.   Respiratory:  Positive for shortness of breath and wheezing. Negative for cough.   Cardiovascular:  Negative for chest pain and leg swelling.  Musculoskeletal:  Negative for myalgias.    Physical Exam Updated Vital Signs BP (!) 113/101   Pulse (!) 107   Temp 98.3 F (36.8 C) (Oral)   Resp (!) 22   Ht 5\' 6"  (1.676 m)   Wt 80 kg   SpO2 96%   BMI 28.47 kg/m  Physical Exam Vitals and nursing  note reviewed.  Constitutional:      Appearance: He is well-developed.  HENT:     Head: Normocephalic and atraumatic.  Cardiovascular:     Rate and Rhythm: Regular rhythm. Tachycardia present.     Heart sounds: Normal heart sounds.  Pulmonary:     Effort: Pulmonary effort is normal. Tachypnea present. No accessory muscle usage or respiratory distress.     Breath sounds: Decreased breath sounds and wheezing present.  Abdominal:     Palpations: Abdomen is soft.     Tenderness: There is no abdominal tenderness.  Musculoskeletal:     Right lower leg: No  tenderness. No edema.     Left lower leg: No tenderness. No edema.  Skin:    General: Skin is warm and dry.  Neurological:     Mental Status: He is alert.     ED Results / Procedures / Treatments   Labs (all labs ordered are listed, but only abnormal results are displayed) Labs Reviewed  COMPREHENSIVE METABOLIC PANEL - Abnormal; Notable for the following components:      Result Value   Glucose, Bld 112 (*)    Creatinine, Ser 1.46 (*)    Total Bilirubin 2.1 (*)    GFR, Estimated 48 (*)    All other components within normal limits  CBC WITH DIFFERENTIAL/PLATELET - Abnormal; Notable for the following components:   RBC 6.05 (*)    Hemoglobin 18.3 (*)    HCT 55.0 (*)    Eosinophils Absolute 0.7 (*)    All other components within normal limits  RESP PANEL BY RT-PCR (RSV, FLU A&B, COVID)  [id number]  RESPIRATORY PANEL BY PCR  BRAIN NATRIURETIC PEPTIDE  D-DIMER, QUANTITATIVE  TROPONIN I (HIGH SENSITIVITY)    EKG EKG Interpretation Date/Time:  Sunday September 23 2023 10:11:18 EST Ventricular Rate:  125 PR Interval:  155 QRS Duration:  86 QT Interval:  343 QTC Calculation: 491 R Axis:   -89  Text Interpretation: Sinus tachycardia with irregular rate LAD, consider left anterior fascicular block Consider right ventricular hypertrophy Repol abnrm suggests ischemia, anterolateral Poor data quality, interpretation may be adversely affected otherwise appears similar to Oct 2022 Confirmed by Pricilla Loveless [id number]) on 09/23/2023 10:24:42 AM  Radiology DG Chest Portable 1 View Result Date: 09/23/2023 CLINICAL DATA:  Dyspnea. EXAM: PORTABLE CHEST 1 VIEW COMPARISON:  02/06/2022. FINDINGS: Cardiac silhouette is normal in size. No mediastinal or hilar masses. Lungs are hyperexpanded. There are prominent interstitial markings in the lower lungs. Small area scarring in the left upper lobe lingula. These findings are stable. No evidence of pneumonia or pulmonary edema. Relative lucency in the upper  lungs is consistent with emphysema. No pleural effusion or pneumothorax. Skeletal structures are grossly intact. IMPRESSION: 1. No acute cardiopulmonary disease. 2. Emphysema. Electronically Signed   By: Amie Portland M.D.   On: 09/23/2023 10:36    Procedures Procedures    Medications Ordered in ED Medications  pantoprazole (PROTONIX) EC tablet 40 mg (has no administration in time range)  atorvastatin (LIPITOR) tablet 10 mg (has no administration in time range)  tamsulosin (FLOMAX) capsule 0.4 mg (has no administration in time range)  predniSONE (DELTASONE) tablet 40 mg (has no administration in time range)  ipratropium (ATROVENT) nebulizer solution 0.5 mg (0.5 mg Nebulization Given 09/23/23 1525)  albuterol (PROVENTIL) (2.5 MG/3ML) 0.083% nebulizer solution 2.5 mg (has no administration in time range)  enoxaparin (LOVENOX) injection 40 mg (40 mg Subcutaneous Given 09/23/23 1535)  sodium chloride flush (NS) 0.9 % injection  3 mL (has no administration in time range)  acetaminophen (TYLENOL) tablet 650 mg (has no administration in time range)    Or  acetaminophen (TYLENOL) suppository 650 mg (has no administration in time range)  polyethylene glycol (MIRALAX / GLYCOLAX) packet 17 g (has no administration in time range)  mometasone-formoterol (DULERA) 100-5 MCG/ACT inhaler 2 puff (has no administration in time range)  ipratropium-albuterol (DUONEB) 0.5-2.5 (3) MG/3ML nebulizer solution 3 mL (3 mLs Nebulization Given 09/23/23 1028)  albuterol (PROVENTIL) (2.5 MG/3ML) 0.083% nebulizer solution 2.5 mg (2.5 mg Nebulization Given 09/23/23 1028)  ondansetron (ZOFRAN) injection 4 mg (4 mg Intravenous Given 09/23/23 1137)  ipratropium-albuterol (DUONEB) 0.5-2.5 (3) MG/3ML nebulizer solution 3 mL (3 mLs Nebulization Given 09/23/23 1137)  albuterol (PROVENTIL) (2.5 MG/3ML) 0.083% nebulizer solution 2.5 mg (2.5 mg Nebulization Given 09/23/23 1137)    ED Course/ Medical Decision Making/ A&P                                  Medical Decision Making Amount and/or Complexity of Data Reviewed Labs: ordered.    Details: Normal ddimer, troponin Radiology: ordered and independent interpretation performed.    Details: No pneumonia ECG/medicine tests: ordered and independent interpretation performed.    Details: Sinus tachycardia  Risk Prescription drug management. Decision regarding hospitalization.   Patient appears to have a COPD exacerbation. Is slowly improving, though still wheezy and symptomatic. No significant cough, which is a little atypical, but he states this feels like COPD to him. His ddimer is negative, and otherwise not at increased risk of PE so I don' think CTA is needed.  He is still short of breath though work of breathing seems a little improved.  I think he will benefit from further treatments and supportive care in the hospital.  Discussed with Dr. Alinda Money, who will admit.        Final Clinical Impression(s) / ED Diagnoses Final diagnoses:  COPD exacerbation [facility])    Rx / DC Orders ED Discharge Orders     None         Pricilla Loveless, MD 09/23/23 1542

## 2023-09-23 NOTE — ED Triage Notes (Signed)
Pt to ED via [facility] from home. Pt has had increasing SHOB for past few days, when FD arrived today, 02 sats mid 80s on RA. Pt has been using Stolto inhaler and ran out 3 days ago. Pt does not wear 02 at home. Pt improved to mid 90s on nebulizer and w/meds w/EMS. Pt labored, 93% on room air on arrival.   Albuterol 15mg  Atrovent 1mg  Solumedrol 125mg   Via 20g LFA

## 2023-09-24 DIAGNOSIS — J9601 Acute respiratory failure with hypoxia: Secondary | ICD-10-CM | POA: Diagnosis not present

## 2023-09-24 DIAGNOSIS — J441 Chronic obstructive pulmonary disease with (acute) exacerbation: Secondary | ICD-10-CM | POA: Diagnosis not present

## 2023-09-24 DIAGNOSIS — G4733 Obstructive sleep apnea (adult) (pediatric): Secondary | ICD-10-CM | POA: Diagnosis not present

## 2023-09-24 DIAGNOSIS — N400 Enlarged prostate without lower urinary tract symptoms: Secondary | ICD-10-CM | POA: Diagnosis not present

## 2023-09-24 LAB — COMPREHENSIVE METABOLIC PANEL
ALT: 22 U/L (ref 0–44)
AST: 22 U/L (ref 15–41)
Albumin: 3.9 g/dL (ref 3.5–5.0)
Alkaline Phosphatase: 80 U/L (ref 38–126)
Anion gap: 12 (ref 5–15)
BUN: 25 mg/dL — ABNORMAL HIGH (ref 8–23)
CO2: 26 mmol/L (ref 22–32)
Calcium: 9.5 mg/dL (ref 8.9–10.3)
Chloride: 98 mmol/L (ref 98–111)
Creatinine, Ser: 1.91 mg/dL — ABNORMAL HIGH (ref 0.61–1.24)
GFR, Estimated: 35 mL/min — ABNORMAL LOW (ref >60)
Glucose, Bld: 128 mg/dL — ABNORMAL HIGH (ref 70–99)
Potassium: 5.1 mmol/L (ref 3.5–5.1)
Sodium: 136 mmol/L (ref 135–145)
Total Bilirubin: 1.4 mg/dL — ABNORMAL HIGH (ref 0.0–1.2)
Total Protein: 6.8 g/dL (ref 6.5–8.1)

## 2023-09-24 LAB — RESPIRATORY PANEL BY PCR

## 2023-09-24 LAB — CBC
HCT: 50.9 % (ref 39.0–52.0)
Hemoglobin: 16.5 g/dL (ref 13.0–17.0)
MCH: 30.1 pg (ref 26.0–34.0)
MCHC: 32.4 g/dL (ref 30.0–36.0)
MCV: 92.9 fL (ref 80.0–100.0)
Platelets: 271 10*3/uL (ref 150–400)
RBC: 5.48 MIL/uL (ref 4.22–5.81)
RDW: 13.4 % (ref 11.5–15.5)
WBC: 11.1 10*3/uL — ABNORMAL HIGH (ref 4.0–10.5)
nRBC: 0 % (ref 0.0–0.2)

## 2023-09-24 MED ORDER — VITAMIN D 25 MCG (1000 UNIT) PO TABS
1000.0000 [IU] | ORAL_TABLET | Freq: Every day | ORAL | Status: DC
  Administered 2023-09-24: 1000 [IU] via ORAL
  Filled 2023-09-24: qty 1

## 2023-09-24 MED ORDER — MONTELUKAST SODIUM 10 MG PO TABS
10.0000 mg | ORAL_TABLET | Freq: Every day | ORAL | Status: DC
  Administered 2023-09-24: 10 mg via ORAL
  Filled 2023-09-24: qty 1

## 2023-09-24 MED ORDER — PREDNISONE 10 MG PO TABS: ORAL_TABLET | ORAL | 0 refills | Status: AC

## 2023-09-24 NOTE — Care Management Obs Status (Signed)
MEDICARE OBSERVATION STATUS NOTIFICATION   Patient Details  Name: Marc Flores MRN: [id number] Date of Birth: 1940-09-06   Medicare Observation Status Notification Given:  Yes    Epifanio Lesches, RN 09/24/2023, 12:02 PM

## 2023-09-24 NOTE — Discharge Planning (Signed)
Patient alert. IV access removed. Discharge teaching given to patient. He verbalized understanding of all teaching. Discharge summary placed in discharge packet. Patient will be transported home in a private vehicle by his wife.

## 2023-09-24 NOTE — ED Notes (Signed)
ED TO INPATIENT HANDOFF REPORT  ED Nurse Name and Phone #: Hansel Starling [phone]  S Name/Age/Gender Marc Flores 83 y.o. male Room/Bed: 044C/044C  Code Status   Code Status: Full Code  Home/SNF/Other Home Patient oriented to: self, place, time, and situation Is this baseline? Yes   Triage Complete: Triage complete  Chief Complaint COPD exacerbation (HCC) [J44.1]  Triage Note Pt to ED via [facility] from home. Pt has had increasing SHOB for past few days, when FD arrived today, 02 sats mid 80s on RA. Pt has been using Stolto inhaler and ran out 3 days ago. Pt does not wear 02 at home. Pt improved to mid 90s on nebulizer and w/meds w/EMS. Pt labored, 93% on room air on arrival.   Albuterol 15mg  Atrovent 1mg  Solumedrol 125mg   Via 20g LFA   Allergies No Known Allergies  Level of Care/Admitting Diagnosis ED Disposition     ED Disposition  Admit   Condition  --   Comment  Hospital Area: [facility] [[id number]]  Level of Care: Telemetry Medical [104]  May place patient in observation at [facility] or [facility] if equivalent level of care is available:: No  Covid Evaluation: Confirmed COVID Negative  Diagnosis: COPD exacerbation [facility]) [[id number]]  Admitting Physician: Synetta Fail [[id number]]  Attending Physician: Synetta Fail [[id number]]          B Medical/Surgery History Past Medical History:  Diagnosis Date   Arthritis    Bruises easily    Cancer (HCC)    BLADDER, 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 Sherene Sires     Diverticulitis    Dyspnea    ON EXERTION   History of chickenpox    Hyperlipidemia    Past Surgical History:  Procedure Laterality Date   COLONOSCOPY  04/13/2020   per Dr. Jeani Hawking, adenomaotus polyps, repeat in 7 yrs    CYSTOSCOPY  11/27/2019   IN [facility]ICE   CYSTOSCOPY W/ RETROGRADES Bilateral 12/18/2019   Procedure: CYSTOSCOPY WITH RETROGRADE PYELOGRAM;   Surgeon: Marcine Matar, MD;  Location: [facility];  Service: Urology;  Laterality: Bilateral;   TONSILLECTOMY  AS CHILD   TRANSURETHRAL RESECTION OF BLADDER TUMOR WITH MITOMYCIN-C N/A 12/18/2019   Procedure: TRANSURETHRAL RESECTION OF BLADDER TUMOR, RIGHT URETEROSCOPY AND RIGHT URETERAL STENT PLACEMENT;  Surgeon: Marcine Matar, MD;  Location: [facility];  Service: Urology;  Laterality: N/A;   WISDOM TOOTH EXTRACTION  AS CHILD     A IV Location/Drains/Wounds Patient Lines/Drains/Airways Status     Active Line/Drains/Airways     Name Placement date Placement time Site Days   Peripheral IV 09/23/23 Distal;Posterior;Right Forearm 09/23/23  0840  Forearm  1   Peripheral IV 09/23/23 20 G Right;Posterior Forearm 09/23/23  1327  Forearm  1   Ureteral Drain/Stent Right ureter 6 Fr. 12/18/19  1424  Right ureter  1376   Incision (Closed) 12/18/19 Penis Other (Comment) 12/18/19  1417  -- 1376            Intake/Output Last 24 hours No intake or output data in the 24 hours ending 09/24/23 0505  Labs/Imaging Results for orders placed or performed during the hospital encounter of 09/23/23 (from the past 48 hours)  Comprehensive metabolic panel     Status: Abnormal   Collection Time: 09/23/23 10:27 AM  Result Value Ref Range   Sodium 138 135 - 145 mmol/L   Potassium 4.5 3.5 -  5.1 mmol/L    Comment: HEMOLYSIS AT THIS LEVEL MAY AFFECT RESULT   Chloride 99 98 - 111 mmol/L   CO2 25 22 - 32 mmol/L   Glucose, Bld 112 (H) 70 - 99 mg/dL    Comment: Glucose reference range applies only to samples taken after fasting for at least 8 hours.   BUN 14 8 - 23 mg/dL   Creatinine, Ser 5.62 (H) 0.61 - 1.24 mg/dL   Calcium 9.5 8.9 - 13.0 mg/dL   Total Protein 7.4 6.5 - 8.1 g/dL   Albumin 4.4 3.5 - 5.0 g/dL   AST 28 15 - 41 U/L    Comment: HEMOLYSIS AT THIS LEVEL MAY AFFECT RESULT   ALT 24 0 - 44 U/L    Comment: HEMOLYSIS AT THIS LEVEL MAY AFFECT RESULT   Alkaline  Phosphatase 97 38 - 126 U/L   Total Bilirubin 2.1 (H) 0.0 - 1.2 mg/dL    Comment: HEMOLYSIS AT THIS LEVEL MAY AFFECT RESULT   GFR, Estimated 48 (L) >60 mL/min    Comment: (NOTE) Calculated using the CKD-EPI Creatinine Equation (2021)    Anion gap 14 5 - 15    Comment: Performed at [facility], [address], [place], [place] [address]  Brain natriuretic peptide     Status: None   Collection Time: 09/23/23 10:27 AM  Result Value Ref Range   B Natriuretic Peptide 26.1 0.0 - 100.0 pg/mL    Comment: Performed at [facility], [address], [place], [place] [address]  Troponin I (High Sensitivity)     Status: None   Collection Time: 09/23/23 10:27 AM  Result Value Ref Range   Troponin I (High Sensitivity) 7 <18 ng/L    Comment: (NOTE) Elevated high sensitivity troponin I (hsTnI) values and significant  changes across serial measurements may suggest ACS but many other  chronic and acute conditions are known to elevate hsTnI results.  Refer to the "Links" section for chest pain algorithms and additional  guidance. Performed at [facility], [address] [place], [place] [address]   CBC with Differential     Status: Abnormal   Collection Time: 09/23/23 10:27 AM  Result Value Ref Range   WBC 9.2 4.0 - 10.5 K/uL   RBC 6.05 (H) 4.22 - 5.81 MIL/uL   Hemoglobin 18.3 (H) 13.0 - 17.0 g/dL   HCT 13.2 (H) 44.0 - 10.2 %   MCV 90.9 80.0 - 100.0 fL   MCH 30.2 26.0 - 34.0 pg   MCHC 33.3 30.0 - 36.0 g/dL   RDW 72.5 36.6 - 44.0 %   Platelets 270 150 - 400 K/uL   nRBC 0.0 0.0 - 0.2 %   Neutrophils Relative % 46 %   Neutro Abs 4.2 1.7 - 7.7 K/uL   Lymphocytes Relative 35 %   Lymphs Abs 3.2 0.7 - 4.0 K/uL   Monocytes Relative 10 %   Monocytes Absolute 0.9 0.1 - 1.0 K/uL   Eosinophils Relative 8 %   Eosinophils Absolute 0.7 (H) 0.0 - 0.5 K/uL   Basophils Relative 1 %   Basophils Absolute 0.1 0.0 - 0.1 K/uL   Immature Granulocytes 0 %   Abs Immature Granulocytes 0.02  0.00 - 0.07 K/uL    Comment: Performed at [facility], [address] [place], [place] [address]  Resp panel by RT-PCR (RSV, Flu A&B, Covid) Anterior Nasal Swab     Status: None   Collection Time: 09/23/23  10:27 AM   Specimen: Anterior Nasal Swab  Result Value Ref Range   SARS Coronavirus 2 by RT PCR NEGATIVE NEGATIVE   Influenza A by PCR NEGATIVE NEGATIVE   Influenza B by PCR NEGATIVE NEGATIVE    Comment: (NOTE) The Xpert Xpress SARS-CoV-2/FLU/RSV plus assay is intended as an aid in the diagnosis of influenza from Nasopharyngeal swab specimens and should not be used as a sole basis for treatment. Nasal washings and aspirates are unacceptable for Xpert Xpress SARS-CoV-2/FLU/RSV testing.  Fact Sheet for Patients: BloggerCourse.com  Fact Sheet for Healthcare Providers: SeriousBroker.it  This test is not yet approved or cleared by the [place] FDA and has been authorized for detection and/or diagnosis of SARS-CoV-2 by FDA under an Emergency Use Authorization (EUA). This EUA will remain in effect (meaning this test can be used) for the duration of the COVID-19 declaration under Section 564(b)(1) of the Act, 21 U.S.C. section 360bbb-3(b)(1), unless the authorization is terminated or revoked.     Resp Syncytial Virus by PCR NEGATIVE NEGATIVE    Comment: (NOTE) Fact Sheet for Patients: BloggerCourse.com  Fact Sheet for Healthcare Providers: SeriousBroker.it  This test is not yet approved or cleared by the [place] FDA and has been authorized for detection and/or diagnosis of SARS-CoV-2 by FDA under an Emergency Use Authorization (EUA). This EUA will remain in effect (meaning this test can be used) for the duration of the COVID-19 declaration under Section 564(b)(1) of the Act, 21 U.S.C. section 360bbb-3(b)(1), unless the authorization is terminated  or revoked.  Performed at [facility], [address], [place], [place] [address]   D-dimer, quantitative     Status: None   Collection Time: 09/23/23 11:28 AM  Result Value Ref Range   D-Dimer, Quant <0.27 0.00 - 0.50 ug/mL-FEU    Comment: (NOTE) At the manufacturer cut-off value of 0.5 g/mL FEU, this assay has a negative predictive value of 95-100%.This assay is intended for use in conjunction with a clinical pretest probability (PTP) assessment model to exclude pulmonary embolism (PE) and deep venous thrombosis (DVT) in outpatients suspected of PE or DVT. Results should be correlated with clinical presentation. Performed at [facility], [address] [place], [place] [address]    DG Chest Portable 1 View Result Date: 09/23/2023 CLINICAL DATA:  Dyspnea. EXAM: PORTABLE CHEST 1 VIEW COMPARISON:  02/06/2022. FINDINGS: Cardiac silhouette is normal in size. No mediastinal or hilar masses. Lungs are hyperexpanded. There are prominent interstitial markings in the lower lungs. Small area scarring in the left upper lobe lingula. These findings are stable. No evidence of pneumonia or pulmonary edema. Relative lucency in the upper lungs is consistent with emphysema. No pleural effusion or pneumothorax. Skeletal structures are grossly intact. IMPRESSION: 1. No acute cardiopulmonary disease. 2. Emphysema. Electronically Signed   By: Amie Portland M.D.   On: 09/23/2023 10:36    Pending Labs Unresulted Labs (From admission, onward)     Start     Ordered   09/30/23 0500  Creatinine, serum  (enoxaparin (LOVENOX)    CrCl >/= 30 ml/min)  Weekly,   R     Comments: while on enoxaparin therapy    09/23/23 1344   09/24/23 0500  Comprehensive metabolic panel  Tomorrow morning,   R        09/23/23 1344   09/24/23 0500  CBC  Tomorrow morning,   R        09/23/23 1344   09/23/23 1338  Respiratory (~20  pathogens) panel by PCR  (COPD / Pneumonia / Cellulitis / Lower Extremity Wound)  Once,    R        09/23/23 1344            Vitals/Pain Today's Vitals   09/23/23 1811 09/23/23 2137 09/24/23 0100 09/24/23 0400  BP:  128/66 110/63 117/62  Pulse:  (!) 102 91 84  Resp:  (!) 24 (!) 21 (!) 22  Temp: 98.2 F (36.8 C) 98.7 F (37.1 C)    TempSrc:  Oral    SpO2:  94% 91% 91%  Weight:      Height:        Isolation Precautions Droplet precaution  Medications Medications  pantoprazole (PROTONIX) EC tablet 40 mg (has no administration in time range)  atorvastatin (LIPITOR) tablet 10 mg (has no administration in time range)  tamsulosin (FLOMAX) capsule 0.4 mg (0.4 mg Oral Given 09/23/23 1858)  predniSONE (DELTASONE) tablet 40 mg (has no administration in time range)  ipratropium (ATROVENT) nebulizer solution 0.5 mg (0.5 mg Nebulization Given 09/24/23 0150)  albuterol (PROVENTIL) (2.5 MG/3ML) 0.083% nebulizer solution 2.5 mg (has no administration in time range)  enoxaparin (LOVENOX) injection 40 mg (40 mg Subcutaneous Given 09/23/23 1535)  sodium chloride flush (NS) 0.9 % injection 3 mL (3 mLs Intravenous Given 09/23/23 2200)  acetaminophen (TYLENOL) tablet 650 mg (has no administration in time range)    Or  acetaminophen (TYLENOL) suppository 650 mg (has no administration in time range)  polyethylene glycol (MIRALAX / GLYCOLAX) packet 17 g (has no administration in time range)  mometasone-formoterol (DULERA) 100-5 MCG/ACT inhaler 2 puff (2 puffs Inhalation Given 09/23/23 2320)  ipratropium-albuterol (DUONEB) 0.5-2.5 (3) MG/3ML nebulizer solution 3 mL (3 mLs Nebulization Given 09/23/23 1028)  albuterol (PROVENTIL) (2.5 MG/3ML) 0.083% nebulizer solution 2.5 mg (2.5 mg Nebulization Given 09/23/23 1028)  ondansetron (ZOFRAN) injection 4 mg (4 mg Intravenous Given 09/23/23 1137)  ipratropium-albuterol (DUONEB) 0.5-2.5 (3) MG/3ML nebulizer solution 3 mL (3 mLs Nebulization Given 09/23/23 1137)  albuterol (PROVENTIL) (2.5 MG/3ML) 0.083% nebulizer solution 2.5 mg (2.5 mg Nebulization  Given 09/23/23 1137)    Mobility walks     Focused Assessments Pulmonary Assessment Handoff:  Lung sounds: L Breath Sounds: Expiratory wheezes R Breath Sounds: Expiratory wheezes O2 Device: Room Air      R Recommendations: See Admitting Provider Note  Report given to:   Additional Notes: Needs oxygen to ambulate due to exertion. RA while nonambulatory. Prefers recliner than bed.

## 2023-09-24 NOTE — Discharge Summary (Signed)
Physician Discharge Summary   Patient: Marc Flores MRN: [id number] DOB: [birth year]  Admit date:     09/23/2023  Discharge date: {dischdate:26783}  Discharge Physician: Marcelino Duster   PCP: Nelwyn Salisbury, MD   Recommendations at discharge:  {Tip this will not be part of the note when signed- Example include specific recommendations for outpatient follow-up, pending tests to follow-up on. (Optional):26781}  ***  Discharge Diagnoses: Principal Problem:   COPD exacerbation (HCC) Active Problems:   Gout   Dyslipidemia   OSA (obstructive sleep apnea)   Benign prostatic hyperplasia without urinary obstruction   GERD (gastroesophageal reflux disease)   Acute respiratory failure with hypoxia (HCC)  Resolved Problems:   * No resolved hospital problems. [facility]: No notes on file  Assessment and Plan: No notes have been filed under this hospital service. Service: Hospitalist     {Tip this will not be part of the note when signed Body mass index is 28.47 kg/m. , ,  (Optional):26781}  {(NOTE) Pain control PDMP Statment (Optional):26782} Consultants: *** Procedures performed: ***  Disposition: {Plan; Disposition:26390} Diet recommendation:  Discharge Diet Orders (From admission, onward)     Start     Ordered   09/24/23 0000  Diet - low sodium heart healthy        09/24/23 1109           {Diet_Plan:26776} DISCHARGE MEDICATION: Allergies as of 09/24/2023   No Known Allergies      Medication List     TAKE these medications    acetaminophen 500 MG tablet Commonly known as: TYLENOL Take 500-1,000 mg by mouth every 6 (six) hours as needed for mild pain.   albuterol 108 (90 Base) MCG/ACT inhaler Commonly known as: VENTOLIN HFA Inhale 2 puffs into the lungs every 6 (six) hours as needed for shortness of breath or wheezing.   atorvastatin 10 MG tablet Commonly known as: LIPITOR TAKE 1 TABLET(10 MG) BY MOUTH DAILY   Breztri Aerosphere  160-9-4.8 MCG/ACT Aero Generic drug: Budeson-Glycopyrrol-Formoterol 2 puffs in morning 2 puffs about 12 hours later   cholecalciferol 25 MCG (1000 UNIT) tablet Commonly known as: VITAMIN D3 Take 1,000 Units by mouth daily.   colchicine 0.6 MG tablet TAKE 2 TABLETS BY MOUTH TWICE DAILY AS NEEDED FOR GOUT FLARE   famotidine 20 MG tablet Commonly known as: Pepcid One after supper What changed:  how much to take how to take this when to take this additional instructions   ipratropium-albuterol 0.5-2.5 (3) MG/3ML Soln Commonly known as: DUONEB Take 3 mLs by nebulization 2 (two) times daily.   mometasone 220 MCG/ACT inhaler Commonly known as: ASMANEX Inhale 2 puffs into the lungs at bedtime.   montelukast 10 MG tablet Commonly known as: SINGULAIR Take 10 mg by mouth daily.   pantoprazole 40 MG tablet Commonly known as: PROTONIX TAKE 1 TABLET(40 MG) BY MOUTH DAILY 30 TO 60 MINUTES BEFORE FIRST MEAL OF THE DAY   predniSONE 10 MG tablet Commonly known as: DELTASONE Take 4 tablets (40 mg total) by mouth daily with breakfast for 2 days, THEN 3 tablets (30 mg total) daily with breakfast for 2 days, THEN 2 tablets (20 mg total) daily with breakfast for 2 days, THEN 1 tablet (10 mg total) daily with breakfast for 2 days. Start taking on: September 25, 2023   tamsulosin 0.4 MG Caps capsule Commonly known as: FLOMAX Take 0.4 mg by mouth daily after supper.   Tiotropium Bromide-Olodaterol 2.5-2.5 MCG/ACT Aers Take 2  puffs by mouth daily.        Follow-up Information     Nelwyn Salisbury, MD Follow up in 1 week(s).   Specialty: Family Medicine Contact information: [address] [place] [address] [phone]                Discharge Exam: Ceasar Mons Weights   09/23/23 1005  Weight: 80 kg   ***  Condition at discharge: {DC Condition:26389}  The results of significant diagnostics from this hospitalization (including imaging, microbiology, ancillary and  laboratory) are listed below for reference.   Imaging Studies: DG Chest Portable 1 View Result Date: 09/23/2023 CLINICAL DATA:  Dyspnea. EXAM: PORTABLE CHEST 1 VIEW COMPARISON:  02/06/2022. FINDINGS: Cardiac silhouette is normal in size. No mediastinal or hilar masses. Lungs are hyperexpanded. There are prominent interstitial markings in the lower lungs. Small area scarring in the left upper lobe lingula. These findings are stable. No evidence of pneumonia or pulmonary edema. Relative lucency in the upper lungs is consistent with emphysema. No pleural effusion or pneumothorax. Skeletal structures are grossly intact. IMPRESSION: 1. No acute cardiopulmonary disease. 2. Emphysema. Electronically Signed   By: Amie Portland M.D.   On: 09/23/2023 10:36    Microbiology: Results for orders placed or performed during the hospital encounter of 09/23/23  Resp panel by RT-PCR (RSV, Flu A&B, Covid) Anterior Nasal Swab     Status: None   Collection Time: 09/23/23 10:27 AM   Specimen: Anterior Nasal Swab  Result Value Ref Range Status   SARS Coronavirus 2 by RT PCR NEGATIVE NEGATIVE Final   Influenza A by PCR NEGATIVE NEGATIVE Final   Influenza B by PCR NEGATIVE NEGATIVE Final    Comment: (NOTE) The Xpert Xpress SARS-CoV-2/FLU/RSV plus assay is intended as an aid in the diagnosis of influenza from Nasopharyngeal swab specimens and should not be used as a sole basis for treatment. Nasal washings and aspirates are unacceptable for Xpert Xpress SARS-CoV-2/FLU/RSV testing.  Fact Sheet for Patients: BloggerCourse.com  Fact Sheet for Healthcare Providers: SeriousBroker.it  This test is not yet approved or cleared by the [place] FDA and has been authorized for detection and/or diagnosis of SARS-CoV-2 by FDA under an Emergency Use Authorization (EUA). This EUA will remain in effect (meaning this test can be used) for the duration of the COVID-19  declaration under Section 564(b)(1) of the Act, 21 U.S.C. section 360bbb-3(b)(1), unless the authorization is terminated or revoked.     Resp Syncytial Virus by PCR NEGATIVE NEGATIVE Final    Comment: (NOTE) Fact Sheet for Patients: BloggerCourse.com  Fact Sheet for Healthcare Providers: SeriousBroker.it  This test is not yet approved or cleared by the [place] FDA and has been authorized for detection and/or diagnosis of SARS-CoV-2 by FDA under an Emergency Use Authorization (EUA). This EUA will remain in effect (meaning this test can be used) for the duration of the COVID-19 declaration under Section 564(b)(1) of the Act, 21 U.S.C. section 360bbb-3(b)(1), unless the authorization is terminated or revoked.  Performed at [facility], [address] [place], [place] [address]     Labs: CBC: Recent Labs  Lab 09/23/23 1027 09/24/23 0517  WBC 9.2 11.1*  NEUTROABS 4.2  --   HGB 18.3* 16.5  HCT 55.0* 50.9  MCV 90.9 92.9  PLT 270 271   Basic Metabolic Panel: Recent Labs  Lab 09/23/23 1027 09/24/23 0517  NA 138 136  K 4.5 5.1  CL 99 98  CO2 25 26  GLUCOSE 112* 128*  BUN 14 25*  CREATININE 1.46* 1.91*  CALCIUM 9.5 9.5   Liver Function Tests: Recent Labs  Lab 09/23/23 1027 09/24/23 0517  AST 28 22  ALT 24 22  ALKPHOS 97 80  BILITOT 2.1* 1.4*  PROT 7.4 6.8  ALBUMIN 4.4 3.9   CBG: No results for input(s): "GLUCAP" in the last 168 hours.  Discharge time spent: {LESS THAN/GREATER ZOXW:96045} 30 minutes.  Signed: Marcelino Duster, MD [facility] 09/24/2023

## 2023-10-22 ENCOUNTER — Telehealth (HOSPITAL_COMMUNITY): Payer: Self-pay

## 2023-10-22 NOTE — Telephone Encounter (Signed)
Pt called in regards to a VA referral sent to him for [facility]. Adv pt we have not recv'ed VA referral. Pt stated he will contact the VA for them to fax referral to [facility].

## 2023-10-24 ENCOUNTER — Telehealth (HOSPITAL_COMMUNITY): Payer: Self-pay

## 2023-10-24 NOTE — Telephone Encounter (Signed)
Pt called in regards to his VA referral. Adv pt we still have not recv'ed referral from the [place]. Pt stated the VA told him they fax it on 10/24/23.

## 2023-10-29 ENCOUNTER — Telehealth (HOSPITAL_COMMUNITY): Payer: Self-pay | Admitting: *Deleted

## 2023-10-29 NOTE — Telephone Encounter (Signed)
 Received message from pt regarding the receipt of VA authorization to participate in the pulmonary rehab program.  Returned pt call.  Advised pt that we have not yet received the authorization.  Asked when he received his letter of notification.  Pt stated on 2/6.  Generally we receive the authorization about a week after the letter is received by the veteran.  Asked if he had the letter near by.  Read to me the name of the coordinator and contact information.  I called and left message for Bria to confirm fax number the authorization is being sent to for pulmonary rehab.  Asked for return call. Alanson Aly, BSN Cardiac and Emergency planning/management officer

## 2023-10-31 ENCOUNTER — Encounter (HOSPITAL_COMMUNITY): Payer: Self-pay

## 2023-10-31 ENCOUNTER — Telehealth (HOSPITAL_COMMUNITY): Payer: Self-pay

## 2023-10-31 NOTE — Progress Notes (Signed)
 Received referral from Dr. Charline Bills for this pt to participate in Pulmonary Rehab with the diagnosis of COPD with exacerbation. Clinical review of pt follow up appt on 10/10/23 Pulmonary office note. Pt appropriate for scheduling for Pulmonary rehab. Will forward to support staff for scheduling and verification of insurance eligibility/benefits with pt consent.   Marc Flores Cardiac and Pulmonary Rehab

## 2023-10-31 NOTE — Progress Notes (Signed)
 Called patient to see if he was interested in participating in the Pulmonary Rehab Program. Patient will come in for orientation on 11/05/23 @ 9:00 and will attend the 10:15 exercise class.  Pensions consultant.

## 2023-11-05 ENCOUNTER — Encounter (HOSPITAL_COMMUNITY): Payer: Self-pay

## 2023-11-05 ENCOUNTER — Encounter (HOSPITAL_COMMUNITY)
Admission: RE | Admit: 2023-11-05 | Discharge: 2023-11-05 | Disposition: A | Discharge status: Home / Self Care | Payer: PPO | Source: Ambulatory Visit | Attending: Pulmonary Disease | Admitting: Pulmonary Disease

## 2023-11-05 ENCOUNTER — Telehealth (HOSPITAL_COMMUNITY): Payer: Self-pay | Admitting: *Deleted

## 2023-11-05 VITALS — BP 120/70 | HR 99 | Ht 66.0 in | Wt 181.4 lb

## 2023-11-05 DIAGNOSIS — J449 Chronic obstructive pulmonary disease, unspecified: Secondary | ICD-10-CM | POA: Diagnosis present

## 2023-11-05 NOTE — Progress Notes (Signed)
   11/05/23 1109  6 Minute Walk  Phase Initial  Distance 1110 feet  Walk Time 6 minutes  # of Rest Breaks 2 (1:50-2:10, 3:37-4:00)  MPH 2.1  METS 2.23  RPE 13  Perceived Dyspnea  4  VO2 Peak 7.81  Symptoms No  Resting HR 99 bpm  Resting BP 120/70  Resting Oxygen Saturation  92 %  Exercise Oxygen Saturation  during 6 min walk 85 %  Max Ex. HR 123 bpm  Max Ex. BP 148/76  2 Minute Post BP 132/70  Interval HR  1 Minute HR 110  2 Minute HR 121  3 Minute HR 122  4 Minute HR 119  5 Minute HR 121  6 Minute HR 123  2 Minute Post HR 123  Interval Heart Rate? Yes  Interval Oxygen  Interval Oxygen? Yes  Baseline Oxygen Saturation % 92 %  1 Minute Oxygen Saturation % 95 %  1 Minute Liters of Oxygen 0 L  2 Minute Oxygen Saturation % 91 % (85% @ 1:50, standing rest break till pt reached 91%)  2 Minute Liters of Oxygen 0 L  3 Minute Oxygen Saturation % 89 %  3 Minute Liters of Oxygen 0 L  4 Minute Oxygen Saturation % 92 % (86% @ 3:37)  4 Minute Liters of Oxygen 0 L (increased to 1L)  5 Minute Oxygen Saturation % 92 %  5 Minute Liters of Oxygen 1 L  6 Minute Oxygen Saturation % 90 %  6 Minute Liters of Oxygen 1 L  2 Minute Post Oxygen Saturation % 95 %  2 Minute Post Liters of Oxygen 1 L

## 2023-11-05 NOTE — Progress Notes (Signed)
 Pulmonary Rehab Orientation Physical Assessment Note  Physical assessment reveals  Pt  who is slightly heart of hearing is alert and oriented x 3. Celebrated a birthday on yesterday.  Heart rate is  tachycardia and irregular at times , breath sounds moderate expiratory wheezing heard diffusely throughout both lungs, moderate inspiratory wheezing heard diffusely throughout both lungs, Short expiration breaths.  Wheezes became audible with walking. Reports mostly non-productive cough. Bowel sounds present.  Pt denies abdominal discomfort, nausea, vomiting or diarrhea. Grip strength equal, strong. Distal pulses palpable; no swelling to lower extremities.   Pt denies any heart history for irregular heart beats.  Pt placed on monitor which showed SR with irregular beats possible PAC's.  Pt asymptomatic and reports never seen by cardiologist. Jeanene Erb and was connected to the 24/7 network nurse line.  Spoke with Nash Mantis.  Update her on the irregular heart rate along with pt decreased oxygen saturation during the 6 minute walk test.  Pt dropped to 85% 1 min 50 seconds. Standing rest break with PLB increased to 91% resume at 3 minute 37 second dropped to 87 stopped PLB increased to 92 finished walk test with O2 sat at 90%.  Note that the finger probe did not provide adequate waveform for accurate reading therefore staff switched to forehead probe with much improved readings. Direct contact phone number provided. Await return call from the [place] team Karlene Lineman RN, BSN Cardiac and Pulmonary Rehab Nurse Navigator

## 2023-11-05 NOTE — Telephone Encounter (Signed)
 Received call back from [facility] at the [place]. Fax number provided to send strips over to review.  Sent to [phone] attention Wynona Neat.  Alanson Aly, BSN Cardiac and Emergency planning/management officer

## 2023-11-05 NOTE — Progress Notes (Signed)
 Marc Flores 83 y.o. male Pulmonary Rehab Orientation Note This patient who was referred to Pulmonary Rehab by Dr. Charline Bills with the diagnosis of COPD arrived today in Cardiac and Pulmonary Rehab. He arrived ambulatory with normal gait. He does not carry portable oxygen. Per patient, Marc Flores uses oxygen never. Color good, skin warm and dry. Patient is oriented to time and place. Patient's medical history, psychosocial health, and medications reviewed. Psychosocial assessment reveals patient lives with spouse. Marc Flores is currently retired. Patient hobbies include gardening and woodworking. Patient reports his stress level is low. Areas of stress/anxiety include N/A. Patient does not exhibit signs of depression. PHQ2/9 score 0/0. Marc Flores shows good  coping skills with positive outlook on life. Offered emotional support and reassurance. Will continue to monitor. Physical assessment performed by Karlene Lineman RN. Please see their orientation physical assessment note. Marc Flores reports he does take medications as prescribed. Patient states he  follows a regular  diet. The patient has been trying to lose weight through a healthy diet and exercise program.. Patient's weight will be monitored closely. Demonstration and practice of PLB using pulse oximeter. Marc Flores able to return demonstration satisfactorily. Safety and hand hygiene in the exercise area reviewed with patient. Marc Flores voices understanding of the information reviewed. Department expectations discussed with patient and achievable goals were set. The patient shows enthusiasm about attending the program and we look forward to working with Marc Flores. Marc Flores completed a 6 min walk test today and is scheduled to begin exercise on 11/13/23 at 10:15 am.   [id number] Joya San, MS, ACSM-CEP

## 2023-11-08 ENCOUNTER — Ambulatory Visit: Payer: Self-pay | Admitting: Family Medicine

## 2023-11-08 NOTE — Telephone Encounter (Signed)
 Copied from CRM [id number]. Topic: Clinical - Red Word Triage >> Nov 08, 2023  3:33 PM Chantha C wrote: Red Word that prompted transfer to Nurse Triage: Patient's spouse Rene Kocher [phone] was at the dentist about an hour ago, they were going to repair tooth and put a cap on it, when they put the numbing on the gums, patient started to feel nausea, light headed and vomiting. Patient pulse over 100, blood pressure 164/89, heart rate, and oxygen 94-98. The dentist did not perform any procedure due the reaction.   Patient has COPD, patient went to the pulmonology rehab 11/05/23 and they detect a-fib, which was in contact with the pulmonolgist- they said since it was so slight and quick; they recommended an EKG.  Chief Complaint: elevated bp, elevated hr, see above Symptoms: see above Frequency: happened quickly Pertinent Negatives: Patient denies headache, numbness, tingling, vision issues, sob, cp Disposition: [] ED /[] Urgent Care (no appt availability in office) / [x] Appointment(In office/virtual)/ []  [facility]/ [] Home Care/ [] Refused Recommended Disposition /[] [employer] [facility]/ []  Follow-up with PCP Additional Notes: 151/70 and HR 103 while on phone with this RN. Per protocol, apt made for tomorrow.  Care advice given and instructed to go to er if becomes worse.  Patient states he might have had a bout of a fib during rehab and they suggested an EKG.   Reason for Disposition  [1] MILD vomiting with diarrhea AND [2] present > 5 days  Answer Assessment - Initial Assessment Questions 1. VOMITING SEVERITY: "How many times have you vomited in the past 24 hours?"     - MILD:  1 - 2 times/day    - MODERATE: 3 - 5 times/day, decreased oral intake without significant weight loss or symptoms of dehydration    - SEVERE: 6 or more times/day, vomits everything or nearly everything, with significant weight loss, symptoms of dehydration      Mild x1 small amount; patient was NPO for dentist 2.  ONSET: "When did the vomiting begin?"      Today after numbing agent 3. FLUIDS: "What fluids or food have you vomited up today?" "Have you been able to keep any fluids down?"     Little fluid 4. ABDOMEN PAIN: "Are your having any abdomen pain?" If Yes : "How bad is it and what does it feel like?" (e.g., crampy, dull, intermittent, constant)      denies 5. DIARRHEA: "Is there any diarrhea?" If Yes, ask: "How many times today?"      denies 6. CONTACTS: "Is there anyone else in the family with the same symptoms?"      denies 7. CAUSE: "What do you think is causing your vomiting?"     Numbing agent, it happened soon after it applied 8. HYDRATION STATUS: "Any signs of dehydration?" (e.g., dry mouth [not only dry lips], too weak to stand) "When did you last urinate?"     No issues 9. OTHER SYMPTOMS: "Do you have any other symptoms?" (e.g., fever, headache, vertigo, vomiting blood or coffee grounds, recent head injury)     denies 10. PREGNANCY: "Is there any chance you are pregnant?" "When was your last menstrual period?"       na  Protocols used: Vomiting-A-AH

## 2023-11-08 NOTE — Progress Notes (Signed)
 Pulmonary Individual Treatment Plan  Patient Details  Name: Marc Flores MRN: [id number] Date of Birth: 06/09/1941 Referring Provider:   Doristine Devoid Pulmonary Rehab Walk Test from 11/05/2023 in [facility] for [facility]  Referring Provider [name]  [[name]]       Initial Encounter Date:  Flowsheet Row Pulmonary Rehab Walk Test from 11/05/2023 in [facility] for [facility], V[facility], [facility]  Date 11/05/23       Visit Diagnosis: Chronic obstructive pulmonary disease, unspecified COPD type (HCC)  Patient's Home Medications on Admission:   Current Outpatient Medications:    acetaminophen (TYLENOL) 500 MG tablet, Take 500-1,000 mg by mouth every 6 (six) hours as needed for mild pain., Disp: , Rfl:    albuterol (VENTOLIN HFA) 108 (90 Base) MCG/ACT inhaler, Inhale 2 puffs into the lungs every 6 (six) hours as needed for shortness of breath or wheezing., Disp: , Rfl:    atorvastatin (LIPITOR) 10 MG tablet, TAKE 1 TABLET(10 MG) BY MOUTH DAILY, Disp: 90 tablet, Rfl: 0   cholecalciferol (VITAMIN D3) 25 MCG (1000 UNIT) tablet, Take 1,000 Units by mouth daily., Disp: , Rfl:    colchicine 0.6 MG tablet, TAKE 2 TABLETS BY MOUTH TWICE DAILY AS NEEDED FOR GOUT FLARE, Disp: 360 tablet, Rfl: 0   famotidine (PEPCID) 20 MG tablet, One after supper (Patient taking differently: Take 20 mg by mouth daily after breakfast.), Disp: 30 tablet, Rfl: 11   ipratropium-albuterol (DUONEB) 0.5-2.5 (3) MG/3ML SOLN, Take 3 mLs by nebulization 2 (two) times daily., Disp: , Rfl:    mometasone (ASMANEX) 220 MCG/ACT inhaler, Inhale 2 puffs into the lungs at bedtime., Disp: , Rfl:    montelukast (SINGULAIR) 10 MG tablet, Take 10 mg by mouth daily., Disp: , Rfl:    pantoprazole (PROTONIX) 40 MG tablet, TAKE 1 TABLET(40 MG) BY MOUTH DAILY 30 TO 60 MINUTES BEFORE FIRST MEAL OF THE DAY, Disp: 30 tablet, Rfl: 5   tamsulosin (FLOMAX) 0.4 MG CAPS capsule,  Take 0.4 mg by mouth daily after supper., Disp: , Rfl:    Tiotropium Bromide-Olodaterol 2.5-2.5 MCG/ACT AERS, Take 2 puffs by mouth daily., Disp: , Rfl:    Budeson-Glycopyrrol-Formoterol (BREZTRI AEROSPHERE) 160-9-4.8 MCG/ACT AERO, 2 puffs in morning 2 puffs about 12 hours later (Patient not taking: Reported on 09/23/2023), Disp: 10.7 g, Rfl: 11 No current facility-administered medications for this encounter.  Facility-Administered Medications Ordered in Other Encounters:    gemcitabine (GEMZAR) chemo syringe for bladder instillation 2,000 mg, 2,000 mg, Bladder Instillation, Once, Marcine Matar, MD  Past Medical History: Past Medical History:  Diagnosis Date   Arthritis    Bruises easily    Cancer (HCC)    BLADDER, 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 Sherene Sires     Diverticulitis    Dyspnea    ON EXERTION   History of chickenpox    Hyperlipidemia     Tobacco Use: Social History   Tobacco Use  Smoking Status Former   Current packs/day: 0.00   Average packs/day: 1 pack/day for 30.0 years (30.0 ttl pk-yrs)   Types: Cigarettes   Start date: 12/07/1961   Quit date: 12/08/1991   Years since quitting: 31.9   Passive exposure: Past  Smokeless Tobacco Never    Labs: Review Flowsheet       Latest Ref Rng & Units 12/04/2009 05/12/2021  Labs for ITP Cardiac and Pulmonary Rehab  Cholestrol 0 -  200 mg/dL - 119   LDL (calc) 0 - 99 mg/dL - 81   HDL-C >14.78 mg/dL - 29.56   Trlycerides 0.0 - 149.0 mg/dL - 21.3   Hemoglobin Y8M 4.6 - 6.5 % - 5.5   PH, Arterial 7.350 - 7.450 7.419  -  PCO2 arterial 35.0 - 45.0 mmHg 42.6  -  Bicarbonate 20.0 - 24.0 mEq/L 27.0  -  TCO2 0 - 100 mmol/L 28.4  -  O2 Saturation % 93.6  -    Capillary Blood Glucose: No results found for: "GLUCAP"   Pulmonary Assessment Scores:  Pulmonary Assessment Scores     Row Name 11/05/23 0925         ADL UCSD   ADL Phase Entry     SOB Score total 29       CAT  Score   CAT Score 12       mMRC Score   mMRC Score 4             UCSD: Self-administered rating of dyspnea associated with activities of daily living (ADLs) 6-point scale (0 = "not at all" to 5 = "maximal or unable to do because of breathlessness")  Scoring Scores range from 0 to 120.  Minimally important difference is 5 units  CAT: CAT can identify the health impairment of COPD patients and is better correlated with disease progression.  CAT has a scoring range of zero to 40. The CAT score is classified into four groups of low (less than 10), medium (10 - 20), high (21-30) and very high (31-40) based on the impact level of disease on health status. A CAT score over 10 suggests significant symptoms.  A worsening CAT score could be explained by an exacerbation, poor medication adherence, poor inhaler technique, or progression of COPD or comorbid conditions.  CAT MCID is 2 points  mMRC: mMRC (Modified Medical Research Council) Dyspnea Scale is used to assess the degree of baseline functional disability in patients of respiratory disease due to dyspnea. No minimal important difference is established. A decrease in score of 1 point or greater is considered a positive change.   Pulmonary Function Assessment:   Exercise Target Goals: Exercise Program Goal: Individual exercise prescription set using results from initial 6 min walk test and THRR while considering  patient's activity barriers and safety.   Exercise Prescription Goal: Initial exercise prescription builds to 30-45 minutes a day of aerobic activity, 2-3 days per week.  Home exercise guidelines will be given to patient during program as part of exercise prescription that the participant will acknowledge.  Activity Barriers & Risk Stratification:  Activity Barriers & Cardiac Risk Stratification - 11/05/23 0938       Activity Barriers & Cardiac Risk Stratification   Activity Barriers Arthritis;Deconditioning;Muscular  Weakness;Shortness of Breath             6 Minute Walk:  6 Minute Walk     Row Name 11/05/23 1109         6 Minute Walk   Phase Initial     Distance 1110 feet     Walk Time 6 minutes     # of Rest Breaks 2  1:50-2:10, 3:37-4:00     MPH 2.1     METS 2.23     RPE 13     Perceived Dyspnea  4     VO2 Peak 7.81     Symptoms No     Resting HR 99 bpm  Resting BP 120/70     Resting Oxygen Saturation  92 %     Exercise Oxygen Saturation  during 6 min walk 85 %     Max Ex. HR 123 bpm     Max Ex. BP 148/76     2 Minute Post BP 132/70       Interval HR   1 Minute HR 110     2 Minute HR 121     3 Minute HR 122     4 Minute HR 119     5 Minute HR 121     6 Minute HR 123     2 Minute Post HR 123     Interval Heart Rate? Yes       Interval Oxygen   Interval Oxygen? Yes     Baseline Oxygen Saturation % 92 %     1 Minute Oxygen Saturation % 95 %     1 Minute Liters of Oxygen 0 L     2 Minute Oxygen Saturation % 91 %  85% @ 1:50, standing rest break till pt reached 91%     2 Minute Liters of Oxygen 0 L     3 Minute Oxygen Saturation % 89 %     3 Minute Liters of Oxygen 0 L     4 Minute Oxygen Saturation % 92 %  86% @ 3:37     4 Minute Liters of Oxygen 0 L  increased to 1L     5 Minute Oxygen Saturation % 92 %     5 Minute Liters of Oxygen 1 L     6 Minute Oxygen Saturation % 90 %     6 Minute Liters of Oxygen 1 L     2 Minute Post Oxygen Saturation % 95 %     2 Minute Post Liters of Oxygen 1 L              Oxygen Initial Assessment:  Oxygen Initial Assessment - 11/05/23 0925       Home Oxygen   Home Oxygen Device None    Sleep Oxygen Prescription None    Home Exercise Oxygen Prescription None    Home Resting Oxygen Prescription None      Initial 6 min Walk   Oxygen Used Continuous    Liters per minute 1      Program Oxygen Prescription   Program Oxygen Prescription Continuous    Liters per minute 1      Intervention   Short Term Goals To learn  and exhibit compliance with exercise, home and travel O2 prescription;To learn and understand importance of maintaining oxygen saturations>88%;To learn and demonstrate proper use of respiratory medications;To learn and understand importance of monitoring SPO2 with pulse oximeter and demonstrate accurate use of the pulse oximeter.;To learn and demonstrate proper pursed lip breathing techniques or other breathing techniques.     Long  Term Goals Exhibits compliance with exercise, home  and travel O2 prescription;Verbalizes importance of monitoring SPO2 with pulse oximeter and return demonstration;Maintenance of O2 saturations>88%;Exhibits proper breathing techniques, such as pursed lip breathing or other method taught during program session;Compliance with respiratory medication;Demonstrates proper use of MDI's             Oxygen Re-Evaluation:   Oxygen Discharge (Final Oxygen Re-Evaluation):   Initial Exercise Prescription:  Initial Exercise Prescription - 11/05/23 1100       Date of Initial Exercise RX and Referring Provider   Date 11/05/23  Referring Provider Vivia Budge   Expected Discharge Date 01/31/24      Oxygen   Oxygen Continuous    Liters 1    Maintain Oxygen Saturation 88% or higher      Treadmill   MPH 1.6    Grade 0    Minutes 15      NuStep   Level 1    SPM 60    Minutes 15      Prescription Details   Frequency (times per week) 2    Duration Progress to 30 minutes of continuous aerobic without signs/symptoms of physical distress      Intensity   THRR 40-80% of Max Heartrate 56-111    Ratings of Perceived Exertion 11-13    Perceived Dyspnea 0-4      Progression   Progression Continue to progress workloads to maintain intensity without signs/symptoms of physical distress.      Resistance Training   Training Prescription Yes    Weight blue bands    Reps 10-15             Perform Capillary Blood Glucose checks as needed.  Exercise  Prescription Changes:   Exercise Comments:   Exercise Goals and Review:   Exercise Goals     Row Name 11/05/23 0924             Exercise Goals   Increase Physical Activity Yes       Intervention Provide advice, education, support and counseling about physical activity/exercise needs.;Develop an individualized exercise prescription for aerobic and resistive training based on initial evaluation findings, risk stratification, comorbidities and participant's personal goals.       Expected Outcomes Short Term: Attend rehab on a regular basis to increase amount of physical activity.;Long Term: Add in home exercise to make exercise part of routine and to increase amount of physical activity.;Long Term: Exercising regularly at least 3-5 days a week.       Increase Strength and Stamina Yes       Intervention Provide advice, education, support and counseling about physical activity/exercise needs.;Develop an individualized exercise prescription for aerobic and resistive training based on initial evaluation findings, risk stratification, comorbidities and participant's personal goals.       Expected Outcomes Short Term: Increase workloads from initial exercise prescription for resistance, speed, and METs.;Short Term: Perform resistance training exercises routinely during rehab and add in resistance training at home;Long Term: Improve cardiorespiratory fitness, muscular endurance and strength as measured by increased METs and functional capacity ( )       Able to understand and use rate of perceived exertion (RPE) scale Yes       Intervention Provide education and explanation on how to use RPE scale       Expected Outcomes Short Term: Able to use RPE daily in rehab to express subjective intensity level;Long Term:  Able to use RPE to guide intensity level when exercising independently       Able to understand and use Dyspnea scale Yes       Intervention Provide education and explanation on how to  use Dyspnea scale       Expected Outcomes Short Term: Able to use Dyspnea scale daily in rehab to express subjective sense of shortness of breath during exertion;Long Term: Able to use Dyspnea scale to guide intensity level when exercising independently       Knowledge and understanding of Target Heart Rate Range (THRR) Yes       Intervention Provide  education and explanation of THRR including how the numbers were predicted and where they are located for reference       Expected Outcomes Short Term: Able to state/look up THRR;Long Term: Able to use THRR to govern intensity when exercising independently;Short Term: Able to use daily as guideline for intensity in rehab       Understanding of Exercise Prescription Yes       Intervention Provide education, explanation, and written materials on patient's individual exercise prescription       Expected Outcomes Short Term: Able to explain program exercise prescription;Long Term: Able to explain home exercise prescription to exercise independently                Exercise Goals Re-Evaluation :   Discharge Exercise Prescription (Final Exercise Prescription Changes):   Nutrition:  Target Goals: Understanding of nutrition guidelines, daily intake of sodium 1500mg , cholesterol 200mg , calories 30% from fat and 7% or less from saturated fats, daily to have 5 or more servings of fruits and vegetables.  Biometrics:    Nutrition Therapy Plan and Nutrition Goals:   Nutrition Assessments:  MEDIFICTS Score Key: >=70 Need to make dietary changes  40-70 Heart Healthy Diet <= 40 Therapeutic Level Cholesterol Diet   Picture Your Plate Scores: <21 Unhealthy dietary pattern with much room for improvement. 41-50 Dietary pattern unlikely to meet recommendations for good health and room for improvement. 51-60 More healthful dietary pattern, with some room for improvement.  >60 Healthy dietary pattern, although there may be some specific behaviors  that could be improved.    Nutrition Goals Re-Evaluation:   Nutrition Goals Discharge (Final Nutrition Goals Re-Evaluation):   Psychosocial: Target Goals: Acknowledge presence or absence of significant depression and/or stress, maximize coping skills, provide positive support system. Participant is able to verbalize types and ability to use techniques and skills needed for reducing stress and depression.  Initial Review & Psychosocial Screening:  Initial Psych Review & Screening - 11/05/23 0927       Initial Review   Current issues with None Identified      Family Dynamics   Good Support System? Yes    Comments spouse      Barriers   Psychosocial barriers to participate in program There are no identifiable barriers or psychosocial needs.      Screening Interventions   Interventions Encouraged to exercise             Quality of Life Scores:  Scores of 19 and below usually indicate a poorer quality of life in these areas.  A difference of  2-3 points is a clinically meaningful difference.  A difference of 2-3 points in the total score of the Quality of Life Index has been associated with significant improvement in overall quality of life, self-image, physical symptoms, and general health in studies assessing change in quality of life.  PHQ-9: Review Flowsheet  More data may exist      11/05/2023 12/21/2022 12/19/2021 05/12/2021 12/16/2020  Depression screen PHQ 2/9  Decreased Interest 0 0 0 0 0 0  Down, Depressed, Hopeless 0 0 0 0 0 0  PHQ - 2 Score 0 0 0 0 0 0  Altered sleeping 0 - - 0 -  Tired, decreased energy 0 - - 0 -  Change in appetite 0 - - 0 -  Feeling bad or failure about yourself  0 - - 0 -  Trouble concentrating 0 - - 0 -  Moving slowly or fidgety/restless 0 - -  0 -  Suicidal thoughts 0 - - 0 -  PHQ-9 Score 0 - - 0 -  Difficult doing work/chores Not difficult at all - - Not difficult at all -    Details       Multiple values from one day are sorted in  reverse-chronological order        Interpretation of Total Score  Total Score Depression Severity:  1-4 = Minimal depression, 5-9 = Mild depression, 10-14 = Moderate depression, 15-19 = Moderately severe depression, 20-27 = Severe depression   Psychosocial Evaluation and Intervention:  Psychosocial Evaluation - 11/05/23 0928       Psychosocial Evaluation & Interventions   Interventions Encouraged to exercise with the program and follow exercise prescription    Comments Sela Hua denies any psychosocial barriers at this time.    Expected Outcomes For Lenny to participate in rehab free of psychosocial barriers.             Psychosocial Re-Evaluation:   Psychosocial Discharge (Final Psychosocial Re-Evaluation):   Education: Education Goals: Education classes will be provided on a weekly basis, covering required topics. Participant will state understanding/return demonstration of topics presented.  Learning Barriers/Preferences:  Learning Barriers/Preferences - 11/05/23 1610       Learning Barriers/Preferences   Learning Barriers Sight    Learning Preferences None             Education Topics: Know Your Numbers Group instruction that is supported by a PowerPoint presentation. Instructor discusses importance of knowing and understanding resting, exercise, and post-exercise oxygen saturation, heart rate, and blood pressure. Oxygen saturation, heart rate, blood pressure, rating of perceived exertion, and dyspnea are reviewed along with a normal range for these values.    Exercise for the Pulmonary Patient Group instruction that is supported by a PowerPoint presentation. Instructor discusses benefits of exercise, core components of exercise, frequency, duration, and intensity of an exercise routine, importance of utilizing pulse oximetry during exercise, safety while exercising, and options of places to exercise outside of rehab.    MET Level  Group instruction provided  by PowerPoint, verbal discussion, and written material to support subject matter. Instructor reviews what METs are and how to increase METs.    Pulmonary Medications Verbally interactive group education provided by instructor with focus on inhaled medications and proper administration.   Anatomy and Physiology of the Respiratory System Group instruction provided by PowerPoint, verbal discussion, and written material to support subject matter. Instructor reviews respiratory cycle and anatomical components of the respiratory system and their functions. Instructor also reviews differences in obstructive and restrictive respiratory diseases with examples of each.    Oxygen Safety Group instruction provided by PowerPoint, verbal discussion, and written material to support subject matter. There is an overview of "What is Oxygen" and "Why do we need it".  Instructor also reviews how to create a safe environment for oxygen use, the importance of using oxygen as prescribed, and the risks of noncompliance. There is a brief discussion on traveling with oxygen and resources the patient may utilize.   Oxygen Use Group instruction provided by PowerPoint, verbal discussion, and written material to discuss how supplemental oxygen is prescribed and different types of oxygen supply systems. Resources for more information are provided.    Breathing Techniques Group instruction that is supported by demonstration and informational handouts. Instructor discusses the benefits of pursed lip and diaphragmatic breathing and detailed demonstration on how to perform both.     Risk Factor Reduction Group instruction that is  supported by a PowerPoint presentation. Instructor discusses the definition of a risk factor, different risk factors for pulmonary disease, and how the heart and lungs work together.   Pulmonary Diseases Group instruction provided by PowerPoint, verbal discussion, and written material to support  subject matter. Instructor gives an overview of the different type of pulmonary diseases. There is also a discussion on risk factors and symptoms as well as ways to manage the diseases.   Stress and Energy Conservation Group instruction provided by PowerPoint, verbal discussion, and written material to support subject matter. Instructor gives an overview of stress and the impact it can have on the body. Instructor also reviews ways to reduce stress. There is also a discussion on energy conservation and ways to conserve energy throughout the day.   Warning Signs and Symptoms Group instruction provided by PowerPoint, verbal discussion, and written material to support subject matter. Instructor reviews warning signs and symptoms of stroke, heart attack, cold and flu. Instructor also reviews ways to prevent the spread of infection.   Other Education Group or individual verbal, written, or video instructions that support the educational goals of the pulmonary rehab program.    Knowledge Questionnaire Score:  Knowledge Questionnaire Score - 11/05/23 1103       Knowledge Questionnaire Score   Pre Score 17/18             Core Components/Risk Factors/Patient Goals at Admission:  Personal Goals and Risk Factors at Admission - 11/05/23 0924       Core Components/Risk Factors/Patient Goals on Admission    Weight Management Weight Loss    Expected Outcomes Short Term: Continue to assess and modify interventions until short term weight is achieved;Long Term: Adherence to nutrition and physical activity/exercise program aimed toward attainment of established weight goal;Weight Maintenance: Understanding of the daily nutrition guidelines, which includes 25-35% calories from fat, 7% or less cal from saturated fats, less than 200mg  cholesterol, less than 1.5gm of sodium, & 5 or more servings of fruits and vegetables daily;Understanding recommendations for meals to include 15-35% energy as protein,  25-35% energy from fat, 35-60% energy from carbohydrates, less than 200mg  of dietary cholesterol, 20-35 gm of total fiber daily;Weight Loss: Understanding of general recommendations for a balanced deficit meal plan, which promotes 1-2 lb weight loss per week and includes a negative energy balance of [phone] kcal/d;Understanding of distribution of calorie intake throughout the day with the consumption of 4-5 meals/snacks    Improve shortness of breath with ADL's Yes    Intervention Provide education, individualized exercise plan and daily activity instruction to help decrease symptoms of SOB with activities of daily living.    Expected Outcomes Short Term: Improve cardiorespiratory fitness to achieve a reduction of symptoms when performing ADLs;Long Term: Be able to perform more ADLs without symptoms or delay the onset of symptoms             Core Components/Risk Factors/Patient Goals Review:    Core Components/Risk Factors/Patient Goals at Discharge (Final Review):    ITP Comments: Dr. Mechele Collin is Medical Director for Pulmonary Rehab at [facility].

## 2023-11-09 ENCOUNTER — Other Ambulatory Visit: Payer: Self-pay | Admitting: Adult Health

## 2023-11-09 ENCOUNTER — Ambulatory Visit (INDEPENDENT_AMBULATORY_CARE_PROVIDER_SITE_OTHER): Admitting: Adult Health

## 2023-11-09 VITALS — BP 138/90 | HR 109 | Temp 97.9°F | Ht 66.0 in | Wt 175.0 lb

## 2023-11-09 DIAGNOSIS — R000 Tachycardia, unspecified: Secondary | ICD-10-CM

## 2023-11-09 DIAGNOSIS — R112 Nausea with vomiting, unspecified: Secondary | ICD-10-CM

## 2023-11-09 MED ORDER — METOPROLOL SUCCINATE ER 25 MG PO TB24: 25.0000 mg | ORAL_TABLET | Freq: Every day | ORAL | 0 refills | Status: DC

## 2023-11-09 NOTE — Progress Notes (Signed)
 Subjective:    Patient ID: Marc Flores, male    DOB: May 26, 1941, 83 y.o.   MRN: [id number]  HPI 83 year old male who  has a past medical history of Arthritis, Bruises easily, Cancer (HCC), COPD (chronic obstructive pulmonary disease) (HCC), Diverticulitis, Dyspnea, History of chickenpox, and Hyperlipidemia.  He presents to the office today for an acute visit. He was sent by his dentist.  He was at his dentist yesterday to have a crown placed.  After placement of topical anesthesia patient became diaphoretic and experienced nausea and vomiting.( This has never happened to him in the past with topical anesthesia)  He did not lose consciousness.  His initial blood pressure was 164/89 heart rate of 115.  At his dentist he was placed on a pulse oximeter and oxygen for 20 minutes.  His heart rate was consistently over 100 bpm and he did not respond to vagal maneuvers.  Dentist reported that his BP was constantly at around a map of 130.  His initial blood oxygen was 94% and went up to 98% on O2.  His respirations were normal.  They did not proceed with treatment and recommended he follow-up with his PCP  Today in the office he reports that he feels weak and tired but has not had any nausea or vomiting.  When he started to feel nauseous he was down and had a tilt in the office chair.  He has not been experiencing any vomiting, diarrhea, or feeling acutely ill prior to this episode.  He does have a history of COPD, currently being seen at the [place] and just received his oxygen tanks to use during activity.  He reports that he does not have to use oxygen during sleep or at rest.  Reports that he is oxygen saturations are usually 89% and above on room air.  He does not normally have an elevated heart rate. He is currently going through Cardiac Rehab for his COPD   Review of Systems  See HPI    Past Medical History:  Diagnosis Date   Arthritis    Bruises easily    Cancer (HCC)    BLADDER, sees  Dr. Retta Diones   COPD (chronic obstructive pulmonary disease) (HCC)    sees Dr. Sherene Sires     Diverticulitis    Dyspnea    ON EXERTION   History of chickenpox    Hyperlipidemia     Social History   Socioeconomic History   Marital status: Married    Spouse name: Not on file   Number of children: 2   Years of education: Not on file   Highest education level: 12th grade  Occupational History   Occupation: security    Comment: retired  Tobacco Use   Smoking status: Former    Current packs/day: 0.00    Average packs/day: 1 pack/day for 30.0 years (30.0 ttl pk-yrs)    Types: Cigarettes    Start date: 12/07/1961    Quit date: 12/08/1991    Years since quitting: 31.9    Passive exposure: Past   Smokeless tobacco: Never  Vaping Use   Vaping status: Never Used  Substance and Sexual Activity   Alcohol use: Yes   Drug use: No   Sexual activity: Yes  Other Topics Concern   Not on file  Social History Narrative   Not on file   Social Drivers of Health   Financial Resource Strain: Low Risk  (11/09/2023)   Overall Financial Resource Strain (CARDIA)  Difficulty of Paying Living Expenses: Not hard at all  Food Insecurity: No Food Insecurity (11/09/2023)   Hunger Vital Sign    Worried About Running Out of Food in the Last Year: Never true    Ran Out of Food in the Last Year: Never true  Transportation Needs: No Transportation Needs (11/09/2023)   PRAPARE - Administrator, Civil Service (Medical): No    Lack of Transportation (Non-Medical): No  Physical Activity: Unknown (11/09/2023)   Exercise Vital Sign    Days of Exercise per Week: 0 days    Minutes of Exercise per Session: Not on file  Stress: No Stress Concern Present (11/09/2023)   [employer] of Occupational Health - Occupational Stress Questionnaire    Feeling of Stress : Not at all  Social Connections: Moderately Isolated (11/09/2023)   Social Connection and Isolation Panel [NHANES]    Frequency of Communication with  Friends and Family: Twice a week    Frequency of Social Gatherings with Friends and Family: Once a week    Attends Religious Services: Never    Database administrator or Organizations: No    Attends Banker Meetings: Never    Marital Status: Married  Catering manager Violence: Not At Risk (09/24/2023)   Humiliation, Afraid, Rape, and Kick questionnaire    Fear of Current or Ex-Partner: No    Emotionally Abused: No    Physically Abused: No    Sexually Abused: No    Past Surgical History:  Procedure Laterality Date   COLONOSCOPY  04/13/2020   per Dr. Jeani Hawking, adenomaotus polyps, repeat in 7 yrs    CYSTOSCOPY  11/27/2019   IN [facility]   CYSTOSCOPY W/ RETROGRADES Bilateral 12/18/2019   Procedure: CYSTOSCOPY WITH RETROGRADE PYELOGRAM;  Surgeon: Marcine Matar, MD;  Location: [facility];  Service: Urology;  Laterality: Bilateral;   TONSILLECTOMY  AS CHILD   TRANSURETHRAL RESECTION OF BLADDER TUMOR WITH MITOMYCIN-C N/A 12/18/2019   Procedure: TRANSURETHRAL RESECTION OF BLADDER TUMOR, RIGHT URETEROSCOPY AND RIGHT URETERAL STENT PLACEMENT;  Surgeon: Marcine Matar, MD;  Location: [facility];  Service: Urology;  Laterality: N/A;   WISDOM TOOTH EXTRACTION  AS CHILD    Family History  Problem Relation Age of Onset   Glaucoma Mother    Diabetes Mother    Hearing loss Mother    Heart disease Mother    Heart disease Father    Atrial fibrillation Father    Arthritis Father    Cancer Father    Hearing loss Father    Hypertension Father     No Known Allergies  Current Outpatient Medications on File Prior to Visit  Medication Sig Dispense Refill   albuterol (VENTOLIN HFA) 108 (90 Base) MCG/ACT inhaler Inhale 2 puffs into the lungs every 6 (six) hours as needed for shortness of breath or wheezing.     atorvastatin (LIPITOR) 10 MG tablet TAKE 1 TABLET(10 MG) BY MOUTH DAILY 90 tablet 0   cholecalciferol (VITAMIN D3) 25 MCG (1000  UNIT) tablet Take 1,000 Units by mouth daily.     colchicine 0.6 MG tablet TAKE 2 TABLETS BY MOUTH TWICE DAILY AS NEEDED FOR GOUT FLARE 360 tablet 0   famotidine (PEPCID) 20 MG tablet One after supper (Patient taking differently: Take 20 mg by mouth daily after breakfast.) 30 tablet 11   ipratropium-albuterol (DUONEB) 0.5-2.5 (3) MG/3ML SOLN Take 3 mLs by nebulization 2 (two) times daily.     mometasone (ASMANEX) 220  MCG/ACT inhaler Inhale 2 puffs into the lungs at bedtime.     montelukast (SINGULAIR) 10 MG tablet Take 10 mg by mouth daily.     pantoprazole (PROTONIX) 40 MG tablet TAKE 1 TABLET(40 MG) BY MOUTH DAILY 30 TO 60 MINUTES BEFORE FIRST MEAL OF THE DAY 30 tablet 5   tamsulosin (FLOMAX) 0.4 MG CAPS capsule Take 0.4 mg by mouth daily after supper.     Tiotropium Bromide-Olodaterol 2.5-2.5 MCG/ACT AERS Take 2 puffs by mouth daily.     Current Facility-Administered Medications on File Prior to Visit  Medication Dose Route Frequency Provider Last Rate Last Admin   gemcitabine (GEMZAR) chemo syringe for bladder instillation 2,000 mg  2,000 mg Bladder Instillation Once Marcine Matar, MD        BP (!) 138/90   Pulse (!) 109   Temp 97.9 F (36.6 C) (Oral)   Ht 5\' 6"  (1.676 m)   Wt 175 lb (79.4 kg)   SpO2 91%   BMI 28.25 kg/m       Objective:   Physical Exam Vitals and nursing note reviewed.  Constitutional:      Appearance: Normal appearance.  Cardiovascular:     Rate and Rhythm: Regular rhythm. Tachycardia present.     Pulses: Normal pulses.     Heart sounds: Normal heart sounds.  Pulmonary:     Effort: Pulmonary effort is normal.     Breath sounds: Normal breath sounds.  Abdominal:     General: Abdomen is flat. Bowel sounds are normal.     Palpations: Abdomen is soft.  Musculoskeletal:        General: Normal range of motion.     Right lower leg: No edema.     Left lower leg: No edema.  Skin:    General: Skin is warm and dry.  Neurological:     General: No focal  deficit present.     Mental Status: He is alert and oriented to person, place, and time.  Psychiatric:        Mood and Affect: Mood normal.        Behavior: Behavior normal.        Thought Content: Thought content normal.        Judgment: Judgment normal.       Assessment & Plan:  1. Tachycardia (Primary)  - EKG 12-Lead- Sinus Tachycardia with with occassional PVC's, Rate 106.  -Spoke with his PCP in the office today.  PCP is okay with starting Toprol 25 mg daily and have him follow-up next week.  Consider referral to cardiology - metoprolol succinate (TOPROL-XL) 25 MG 24 hr tablet; Take 1 tablet (25 mg total) by mouth daily.  Dispense: 30 tablet; Refill: 0  2. Nausea and vomiting, unspecified vomiting type -Unknown cause at this time.  Possible vasovagal episode. Continue to monitor   Shirline Frees, NP

## 2023-11-12 ENCOUNTER — Telehealth (HOSPITAL_COMMUNITY): Payer: Self-pay

## 2023-11-12 NOTE — Telephone Encounter (Signed)
 Called pt to offer him a different class time for Pulmonary rehab. No answer. VM left

## 2023-11-13 ENCOUNTER — Telehealth (HOSPITAL_COMMUNITY): Payer: Self-pay

## 2023-11-13 ENCOUNTER — Encounter (HOSPITAL_COMMUNITY): Payer: PPO

## 2023-11-13 NOTE — Telephone Encounter (Signed)
 Attempted to return pt phone call from VM that was left. LMTCB

## 2023-11-15 ENCOUNTER — Encounter (HOSPITAL_COMMUNITY): Payer: PPO

## 2023-11-16 ENCOUNTER — Ambulatory Visit: Admitting: Adult Health

## 2023-11-16 ENCOUNTER — Telehealth: Payer: Self-pay | Admitting: Family Medicine

## 2023-11-16 ENCOUNTER — Encounter: Payer: Self-pay | Admitting: Family Medicine

## 2023-11-16 ENCOUNTER — Ambulatory Visit: Admitting: Family Medicine

## 2023-11-16 VITALS — BP 132/68 | HR 76 | Temp 98.0°F | Wt 177.0 lb

## 2023-11-16 DIAGNOSIS — R000 Tachycardia, unspecified: Secondary | ICD-10-CM | POA: Diagnosis not present

## 2023-11-16 DIAGNOSIS — I10 Essential (primary) hypertension: Secondary | ICD-10-CM

## 2023-11-16 MED ORDER — METOPROLOL SUCCINATE ER 25 MG PO TB24: 25.0000 mg | ORAL_TABLET | Freq: Every day | ORAL | 3 refills | Status: AC

## 2023-11-16 NOTE — Telephone Encounter (Signed)
 LVM with pt--he needs to see his pcp (Dr. Clent Ridges) instead of Shirline Frees per provider's instruction--openings with Dr. Clent Ridges as of now (08:04 am).

## 2023-11-16 NOTE — Progress Notes (Signed)
   Subjective:    Patient ID: Marc Flores, male    DOB: Dec 17, 1940, 83 y.o.   MRN: [id number]  HPI Here to follow up on elevated BP and rapid heart rate. He was seen here on 11-09-23 after his BP shot up at his dentist's office, and his BP was 164/89 with a pulse rate of 115. His EKG showed sinus rhythm with a few PVC's. He was started on Metoprolol succinate 25 mg daily, and he has felt fine ever since then. His BP and pulse rate ae back to normal today.    Review of Systems  Constitutional: Negative.   Respiratory: Negative.    Cardiovascular: Negative.        Objective:   Physical Exam Constitutional:      Appearance: Normal appearance.  Cardiovascular:     Rate and Rhythm: Normal rate and regular rhythm.     Pulses: Normal pulses.     Heart sounds: Normal heart sounds.  Pulmonary:     Effort: Pulmonary effort is normal.     Breath sounds: Normal breath sounds.  Neurological:     Mental Status: He is alert.           Assessment & Plan:  His HTN is now well controlled. We agreed to keep him on this medication. Recheck here in 3 months.  Gershon Crane, MD

## 2023-11-19 ENCOUNTER — Telehealth (HOSPITAL_COMMUNITY): Payer: Self-pay | Admitting: *Deleted

## 2023-11-19 NOTE — Telephone Encounter (Signed)
 Pt called to remind that he will miss his [facility] appt tomorrow [facility]. Canceled the appt.  Ethelda Chick BS, ACSM-CEP 11/19/2023 2:55 PM

## 2023-11-20 ENCOUNTER — Encounter (HOSPITAL_COMMUNITY): Payer: PPO

## 2023-11-21 NOTE — Progress Notes (Signed)
 Pulmonary Individual Treatment Plan  Patient Details  Name: Marc Flores MRN: [id number] Date of Birth: 1940/11/27 Referring Provider:   Doristine Devoid Pulmonary Rehab Walk Test from 11/05/2023 in [facility]  Referring Provider [name]  [[name]]       Initial Encounter Date:  Flowsheet Row Pulmonary Rehab Walk Test from 11/05/2023 in [facility]  Date 11/05/23       Visit Diagnosis: Chronic obstructive pulmonary disease, unspecified COPD type (HCC)  Patient's Home Medications on Admission:   Current Outpatient Medications:    albuterol (VENTOLIN HFA) 108 (90 Base) MCG/ACT inhaler, Inhale 2 puffs into the lungs every 6 (six) hours as needed for shortness of breath or wheezing., Disp: , Rfl:    atorvastatin (LIPITOR) 10 MG tablet, TAKE 1 TABLET(10 MG) BY MOUTH DAILY, Disp: 90 tablet, Rfl: 0   cholecalciferol (VITAMIN D3) 25 MCG (1000 UNIT) tablet, Take 1,000 Units by mouth daily., Disp: , Rfl:    colchicine 0.6 MG tablet, TAKE 2 TABLETS BY MOUTH TWICE DAILY AS NEEDED FOR GOUT FLARE, Disp: 360 tablet, Rfl: 0   famotidine (PEPCID) 20 MG tablet, One after supper (Patient taking differently: Take 20 mg by mouth daily after breakfast.), Disp: 30 tablet, Rfl: 11   ipratropium-albuterol (DUONEB) 0.5-2.5 (3) MG/3ML SOLN, Take 3 mLs by nebulization 2 (two) times daily., Disp: , Rfl:    mometasone (ASMANEX) 220 MCG/ACT inhaler, Inhale 2 puffs into the lungs at bedtime., Disp: , Rfl:    montelukast (SINGULAIR) 10 MG tablet, Take 10 mg by mouth daily., Disp: , Rfl:    pantoprazole (PROTONIX) 40 MG tablet, TAKE 1 TABLET(40 MG) BY MOUTH DAILY 30 TO 60 MINUTES BEFORE FIRST MEAL OF THE DAY, Disp: 30 tablet, Rfl: 5   tamsulosin (FLOMAX) 0.4 MG CAPS capsule, Take 0.4 mg by mouth daily after supper., Disp: , Rfl:    Tiotropium Bromide-Olodaterol 2.5-2.5 MCG/ACT AERS, Take 2 puffs by  mouth daily., Disp: , Rfl:    metoprolol succinate (TOPROL-XL) 25 MG 24 hr tablet, Take 1 tablet (25 mg total) by mouth daily., Disp: 90 tablet, Rfl: 3 No current facility-administered medications for this encounter.  Facility-Administered Medications Ordered in Other Encounters:    gemcitabine (GEMZAR) chemo syringe for bladder instillation 2,000 mg, 2,000 mg, Bladder Instillation, Once, Marcine Matar, MD  Past Medical History: Past Medical History:  Diagnosis Date   Arthritis    Bruises easily    Cancer (HCC)    BLADDER, 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 Sherene Sires     Diverticulitis    Dyspnea    ON EXERTION   History of chickenpox    Hyperlipidemia     Tobacco Use: Social History   Tobacco Use  Smoking Status Former   Current packs/day: 0.00   Average packs/day: 1 pack/day for 30.0 years (30.0 ttl pk-yrs)   Types: Cigarettes   Start date: 12/07/1961   Quit date: 12/08/1991   Years since quitting: 31.9   Passive exposure: Past  Smokeless Tobacco Never    Labs: Review Flowsheet       Latest Ref Rng & Units 12/04/2009 05/12/2021  Labs for ITP Cardiac and Pulmonary Rehab  Cholestrol 0 - 200 mg/dL - 409   LDL (calc) 0 - 99 mg/dL - 81   HDL-C >81.19 mg/dL - 14.78   Trlycerides 0.0 - 149.0 mg/dL -  89.0   Hemoglobin A1c 4.6 - 6.5 % - 5.5   PH, Arterial 7.350 - 7.450 7.419  -  PCO2 arterial 35.0 - 45.0 mmHg 42.6  -  Bicarbonate 20.0 - 24.0 mEq/L 27.0  -  TCO2 0 - 100 mmol/L 28.4  -  O2 Saturation % 93.6  -    Capillary Blood Glucose: No results found for: "GLUCAP"   Pulmonary Assessment Scores:  Pulmonary Assessment Scores     Row Name 11/05/23 0925         ADL UCSD   ADL Phase Entry     SOB Score total 29       CAT Score   CAT Score 12       mMRC Score   mMRC Score 4             UCSD: Self-administered rating of dyspnea associated with activities of daily living (ADLs) 6-point scale (0 = "not at  all" to 5 = "maximal or unable to do because of breathlessness")  Scoring Scores range from 0 to 120.  Minimally important difference is 5 units  CAT: CAT can identify the health impairment of COPD patients and is better correlated with disease progression.  CAT has a scoring range of zero to 40. The CAT score is classified into four groups of low (less than 10), medium (10 - 20), high (21-30) and very high (31-40) based on the impact level of disease on health status. A CAT score over 10 suggests significant symptoms.  A worsening CAT score could be explained by an exacerbation, poor medication adherence, poor inhaler technique, or progression of COPD or comorbid conditions.  CAT MCID is 2 points  mMRC: mMRC (Modified Medical Research Council) Dyspnea Scale is used to assess the degree of baseline functional disability in patients of respiratory disease due to dyspnea. No minimal important difference is established. A decrease in score of 1 point or greater is considered a positive change.   Pulmonary Function Assessment:   Exercise Target Goals: Exercise Program Goal: Individual exercise prescription set using results from initial 6 min walk test and THRR while considering  patient's activity barriers and safety.   Exercise Prescription Goal: Initial exercise prescription builds to 30-45 minutes a day of aerobic activity, 2-3 days per week.  Home exercise guidelines will be given to patient during program as part of exercise prescription that the participant will acknowledge.  Activity Barriers & Risk Stratification:  Activity Barriers & Cardiac Risk Stratification - 11/05/23 0938       Activity Barriers & Cardiac Risk Stratification   Activity Barriers Arthritis;Deconditioning;Muscular Weakness;Shortness of Breath             6 Minute Walk:  6 Minute Walk     Row Name 11/05/23 1109         6 Minute Walk   Phase Initial     Distance 1110 feet     Walk Time 6 minutes      # of Rest Breaks 2  1:50-2:10, 3:37-4:00     MPH 2.1     METS 2.23     RPE 13     Perceived Dyspnea  4     VO2 Peak 7.81     Symptoms No     Resting HR 99 bpm     Resting BP 120/70     Resting Oxygen Saturation  92 %     Exercise Oxygen Saturation  during 6 min walk 85 %  Max Ex. HR 123 bpm     Max Ex. BP 148/76     2 Minute Post BP 132/70       Interval HR   1 Minute HR 110     2 Minute HR 121     3 Minute HR 122     4 Minute HR 119     5 Minute HR 121     6 Minute HR 123     2 Minute Post HR 123     Interval Heart Rate? Yes       Interval Oxygen   Interval Oxygen? Yes     Baseline Oxygen Saturation % 92 %     1 Minute Oxygen Saturation % 95 %     1 Minute Liters of Oxygen 0 L     2 Minute Oxygen Saturation % 91 %  85% @ 1:50, standing rest break till pt reached 91%     2 Minute Liters of Oxygen 0 L     3 Minute Oxygen Saturation % 89 %     3 Minute Liters of Oxygen 0 L     4 Minute Oxygen Saturation % 92 %  86% @ 3:37     4 Minute Liters of Oxygen 0 L  increased to 1L     5 Minute Oxygen Saturation % 92 %     5 Minute Liters of Oxygen 1 L     6 Minute Oxygen Saturation % 90 %     6 Minute Liters of Oxygen 1 L     2 Minute Post Oxygen Saturation % 95 %     2 Minute Post Liters of Oxygen 1 L              Oxygen Initial Assessment:  Oxygen Initial Assessment - 11/05/23 0925       Home Oxygen   Home Oxygen Device None    Sleep Oxygen Prescription None    Home Exercise Oxygen Prescription None    Home Resting Oxygen Prescription None      Initial 6 min Walk   Oxygen Used Continuous    Liters per minute 1      Program Oxygen Prescription   Program Oxygen Prescription Continuous    Liters per minute 1      Intervention   Short Term Goals To learn and exhibit compliance with exercise, home and travel O2 prescription;To learn and understand importance of maintaining oxygen saturations>88%;To learn and demonstrate proper use of respiratory  medications;To learn and understand importance of monitoring SPO2 with pulse oximeter and demonstrate accurate use of the pulse oximeter.;To learn and demonstrate proper pursed lip breathing techniques or other breathing techniques.     Long  Term Goals Exhibits compliance with exercise, home  and travel O2 prescription;Verbalizes importance of monitoring SPO2 with pulse oximeter and return demonstration;Maintenance of O2 saturations>88%;Exhibits proper breathing techniques, such as pursed lip breathing or other method taught during program session;Compliance with respiratory medication;Demonstrates proper use of MDI's             Oxygen Re-Evaluation:  Oxygen Re-Evaluation     Row Name 11/19/23 0935             Program Oxygen Prescription   Program Oxygen Prescription Continuous       Liters per minute 1         Home Oxygen   Home Oxygen Device None       Sleep Oxygen Prescription None  Home Exercise Oxygen Prescription None       Home Resting Oxygen Prescription None         Goals/Expected Outcomes   Short Term Goals To learn and exhibit compliance with exercise, home and travel O2 prescription;To learn and understand importance of maintaining oxygen saturations>88%;To learn and demonstrate proper use of respiratory medications;To learn and understand importance of monitoring SPO2 with pulse oximeter and demonstrate accurate use of the pulse oximeter.;To learn and demonstrate proper pursed lip breathing techniques or other breathing techniques.        Long  Term Goals Exhibits compliance with exercise, home  and travel O2 prescription;Verbalizes importance of monitoring SPO2 with pulse oximeter and return demonstration;Maintenance of O2 saturations>88%;Exhibits proper breathing techniques, such as pursed lip breathing or other method taught during program session;Compliance with respiratory medication;Demonstrates proper use of MDI's       Goals/Expected Outcomes Compliance and  understanding of oxygen saturation monitoring and breathing techniques to decrease shortness of breath.                Oxygen Discharge (Final Oxygen Re-Evaluation):  Oxygen Re-Evaluation - 11/19/23 0935       Program Oxygen Prescription   Program Oxygen Prescription Continuous    Liters per minute 1      Home Oxygen   Home Oxygen Device None    Sleep Oxygen Prescription None    Home Exercise Oxygen Prescription None    Home Resting Oxygen Prescription None      Goals/Expected Outcomes   Short Term Goals To learn and exhibit compliance with exercise, home and travel O2 prescription;To learn and understand importance of maintaining oxygen saturations>88%;To learn and demonstrate proper use of respiratory medications;To learn and understand importance of monitoring SPO2 with pulse oximeter and demonstrate accurate use of the pulse oximeter.;To learn and demonstrate proper pursed lip breathing techniques or other breathing techniques.     Long  Term Goals Exhibits compliance with exercise, home  and travel O2 prescription;Verbalizes importance of monitoring SPO2 with pulse oximeter and return demonstration;Maintenance of O2 saturations>88%;Exhibits proper breathing techniques, such as pursed lip breathing or other method taught during program session;Compliance with respiratory medication;Demonstrates proper use of MDI's    Goals/Expected Outcomes Compliance and understanding of oxygen saturation monitoring and breathing techniques to decrease shortness of breath.             Initial Exercise Prescription:  Initial Exercise Prescription - 11/05/23 1100       Date of Initial Exercise RX and Referring Provider   Date 11/05/23    Referring Provider Vivia Budge   Expected Discharge Date 01/31/24      Oxygen   Oxygen Continuous    Liters 1    Maintain Oxygen Saturation 88% or higher      Treadmill   MPH 1.6    Grade 0    Minutes 15      NuStep   Level 1    SPM 60     Minutes 15      Prescription Details   Frequency (times per week) 2    Duration Progress to 30 minutes of continuous aerobic without signs/symptoms of physical distress      Intensity   THRR 40-80% of Max Heartrate 56-111    Ratings of Perceived Exertion 11-13    Perceived Dyspnea 0-4      Progression   Progression Continue to progress workloads to maintain intensity without signs/symptoms of physical distress.  Resistance Training   Training Prescription Yes    Weight blue bands    Reps 10-15             Perform Capillary Blood Glucose checks as needed.  Exercise Prescription Changes:   Exercise Comments:   Exercise Goals and Review:   Exercise Goals     Row Name 11/05/23 0924             Exercise Goals   Increase Physical Activity Yes       Intervention Provide advice, education, support and counseling about physical activity/exercise needs.;Develop an individualized exercise prescription for aerobic and resistive training based on initial evaluation findings, risk stratification, comorbidities and participant's personal goals.       Expected Outcomes Short Term: Attend rehab on a regular basis to increase amount of physical activity.;Long Term: Add in home exercise to make exercise part of routine and to increase amount of physical activity.;Long Term: Exercising regularly at least 3-5 days a week.       Increase Strength and Stamina Yes       Intervention Provide advice, education, support and counseling about physical activity/exercise needs.;Develop an individualized exercise prescription for aerobic and resistive training based on initial evaluation findings, risk stratification, comorbidities and participant's personal goals.       Expected Outcomes Short Term: Increase workloads from initial exercise prescription for resistance, speed, and METs.;Short Term: Perform resistance training exercises routinely during rehab and add in resistance training at  home;Long Term: Improve cardiorespiratory fitness, muscular endurance and strength as measured by increased METs and functional capacity ( )       Able to understand and use rate of perceived exertion (RPE) scale Yes       Intervention Provide education and explanation on how to use RPE scale       Expected Outcomes Short Term: Able to use RPE daily in rehab to express subjective intensity level;Long Term:  Able to use RPE to guide intensity level when exercising independently       Able to understand and use Dyspnea scale Yes       Intervention Provide education and explanation on how to use Dyspnea scale       Expected Outcomes Short Term: Able to use Dyspnea scale daily in rehab to express subjective sense of shortness of breath during exertion;Long Term: Able to use Dyspnea scale to guide intensity level when exercising independently       Knowledge and understanding of Target Heart Rate Range (THRR) Yes       Intervention Provide education and explanation of THRR including how the numbers were predicted and where they are located for reference       Expected Outcomes Short Term: Able to state/look up THRR;Long Term: Able to use THRR to govern intensity when exercising independently;Short Term: Able to use daily as guideline for intensity in rehab       Understanding of Exercise Prescription Yes       Intervention Provide education, explanation, and written materials on patient's individual exercise prescription       Expected Outcomes Short Term: Able to explain program exercise prescription;Long Term: Able to explain home exercise prescription to exercise independently                Exercise Goals Re-Evaluation :  Exercise Goals Re-Evaluation     Row Name 11/19/23 0934             Exercise Goal Re-Evaluation   Exercise Goals Review  Increase Physical Activity;Able to understand and use Dyspnea scale;Understanding of Exercise Prescription;Increase Strength and Stamina;Knowledge  and understanding of Target Heart Rate Range (THRR);Able to understand and use rate of perceived exertion (RPE) scale       Comments Marc Flores was scheduled to begin exercise on 3/13 but called out due to other appointments. Pt will start exercise on 3/18. Will continue to monitor and progress as able.       Expected Outcomes Through exercise at rehab and home, the patient will decrease shortness of breath with daily activities and feel confident in carrying out an exercise regimen at home.                Discharge Exercise Prescription (Final Exercise Prescription Changes):   Nutrition:  Target Goals: Understanding of nutrition guidelines, daily intake of sodium 1500mg , cholesterol 200mg , calories 30% from fat and 7% or less from saturated fats, daily to have 5 or more servings of fruits and vegetables.  Biometrics:    Nutrition Therapy Plan and Nutrition Goals:   Nutrition Assessments:  MEDIFICTS Score Key: >=70 Need to make dietary changes  40-70 Heart Healthy Diet <= 40 Therapeutic Level Cholesterol Diet   Picture Your Plate Scores: <40 Unhealthy dietary pattern with much room for improvement. 41-50 Dietary pattern unlikely to meet recommendations for good health and room for improvement. 51-60 More healthful dietary pattern, with some room for improvement.  >60 Healthy dietary pattern, although there may be some specific behaviors that could be improved.    Nutrition Goals Re-Evaluation:   Nutrition Goals Discharge (Final Nutrition Goals Re-Evaluation):   Psychosocial: Target Goals: Acknowledge presence or absence of significant depression and/or stress, maximize coping skills, provide positive support system. Participant is able to verbalize types and ability to use techniques and skills needed for reducing stress and depression.  Initial Review & Psychosocial Screening:  Initial Psych Review & Screening - 11/05/23 0927       Initial Review   Current issues  with None Identified      Family Dynamics   Good Support System? Yes    Comments spouse      Barriers   Psychosocial barriers to participate in program There are no identifiable barriers or psychosocial needs.      Screening Interventions   Interventions Encouraged to exercise             Quality of Life Scores:  Scores of 19 and below usually indicate a poorer quality of life in these areas.  A difference of  2-3 points is a clinically meaningful difference.  A difference of 2-3 points in the total score of the Quality of Life Index has been associated with significant improvement in overall quality of life, self-image, physical symptoms, and general health in studies assessing change in quality of life.  PHQ-9: Review Flowsheet  More data may exist      11/05/2023 12/21/2022 12/19/2021 05/12/2021 12/16/2020  Depression screen PHQ 2/9  Decreased Interest 0 0 0 0 0 0  Down, Depressed, Hopeless 0 0 0 0 0 0  PHQ - 2 Score 0 0 0 0 0 0  Altered sleeping 0 - - 0 -  Tired, decreased energy 0 - - 0 -  Change in appetite 0 - - 0 -  Feeling bad or failure about yourself  0 - - 0 -  Trouble concentrating 0 - - 0 -  Moving slowly or fidgety/restless 0 - - 0 -  Suicidal thoughts 0 - - 0 -  PHQ-9 Score 0 - - 0 -  Difficult doing work/chores Not difficult at all - - Not difficult at all -    Details       Multiple values from one day are sorted in reverse-chronological order        Interpretation of Total Score  Total Score Depression Severity:  1-4 = Minimal depression, 5-9 = Mild depression, 10-14 = Moderate depression, 15-19 = Moderately severe depression, 20-27 = Severe depression   Psychosocial Evaluation and Intervention:  Psychosocial Evaluation - 11/05/23 0928       Psychosocial Evaluation & Interventions   Interventions Encouraged to exercise with the program and follow exercise prescription    Comments Marc Flores denies any psychosocial barriers at this time.    Expected  Outcomes For Marc Flores to participate in rehab free of psychosocial barriers.             Psychosocial Re-Evaluation:  Psychosocial Re-Evaluation     Row Name 11/14/23 1254             Psychosocial Re-Evaluation   Current issues with None Identified       Comments Marc Flores has not attended any sessions so far. No new psychosocial barriers or concerns since orientation.       Expected Outcomes For Marc Flores to attend PR free of any psychosocial barriers or concerns       Interventions Encouraged to attend Pulmonary Rehabilitation for the exercise       Continue Psychosocial Services  No Follow up required                Psychosocial Discharge (Final Psychosocial Re-Evaluation):  Psychosocial Re-Evaluation - 11/14/23 1254       Psychosocial Re-Evaluation   Current issues with None Identified    Comments Marc Flores has not attended any sessions so far. No new psychosocial barriers or concerns since orientation.    Expected Outcomes For Marc Flores to attend PR free of any psychosocial barriers or concerns    Interventions Encouraged to attend Pulmonary Rehabilitation for the exercise    Continue Psychosocial Services  No Follow up required             Education: Education Goals: Education classes will be provided on a weekly basis, covering required topics. Participant will state understanding/return demonstration of topics presented.  Learning Barriers/Preferences:  Learning Barriers/Preferences - 11/05/23 0254       Learning Barriers/Preferences   Learning Barriers Sight    Learning Preferences None             Education Topics: Know Your Numbers Group instruction that is supported by a PowerPoint presentation. Instructor discusses importance of knowing and understanding resting, exercise, and post-exercise oxygen saturation, heart rate, and blood pressure. Oxygen saturation, heart rate, blood pressure, rating of perceived exertion, and dyspnea are reviewed along with a  normal range for these values.    Exercise for the Pulmonary Patient Group instruction that is supported by a PowerPoint presentation. Instructor discusses benefits of exercise, core components of exercise, frequency, duration, and intensity of an exercise routine, importance of utilizing pulse oximetry during exercise, safety while exercising, and options of places to exercise outside of rehab.    MET Level  Group instruction provided by PowerPoint, verbal discussion, and written material to support subject matter. Instructor reviews what METs are and how to increase METs.    Pulmonary Medications Verbally interactive group education provided by instructor with focus on inhaled medications and proper administration.   Anatomy and Physiology  of the Respiratory System Group instruction provided by PowerPoint, verbal discussion, and written material to support subject matter. Instructor reviews respiratory cycle and anatomical components of the respiratory system and their functions. Instructor also reviews differences in obstructive and restrictive respiratory diseases with examples of each.    Oxygen Safety Group instruction provided by PowerPoint, verbal discussion, and written material to support subject matter. There is an overview of "What is Oxygen" and "Why do we need it".  Instructor also reviews how to create a safe environment for oxygen use, the importance of using oxygen as prescribed, and the risks of noncompliance. There is a brief discussion on traveling with oxygen and resources the patient may utilize.   Oxygen Use Group instruction provided by PowerPoint, verbal discussion, and written material to discuss how supplemental oxygen is prescribed and different types of oxygen supply systems. Resources for more information are provided.    Breathing Techniques Group instruction that is supported by demonstration and informational handouts. Instructor discusses the benefits of  pursed lip and diaphragmatic breathing and detailed demonstration on how to perform both.     Risk Factor Reduction Group instruction that is supported by a PowerPoint presentation. Instructor discusses the definition of a risk factor, different risk factors for pulmonary disease, and how the heart and lungs work together.   Pulmonary Diseases Group instruction provided by PowerPoint, verbal discussion, and written material to support subject matter. Instructor gives an overview of the different type of pulmonary diseases. There is also a discussion on risk factors and symptoms as well as ways to manage the diseases.   Stress and Energy Conservation Group instruction provided by PowerPoint, verbal discussion, and written material to support subject matter. Instructor gives an overview of stress and the impact it can have on the body. Instructor also reviews ways to reduce stress. There is also a discussion on energy conservation and ways to conserve energy throughout the day.   Warning Signs and Symptoms Group instruction provided by PowerPoint, verbal discussion, and written material to support subject matter. Instructor reviews warning signs and symptoms of stroke, heart attack, cold and flu. Instructor also reviews ways to prevent the spread of infection.   Other Education Group or individual verbal, written, or video instructions that support the educational goals of the pulmonary rehab program.    Knowledge Questionnaire Score:  Knowledge Questionnaire Score - 11/05/23 1103       Knowledge Questionnaire Score   Pre Score 17/18             Core Components/Risk Factors/Patient Goals at Admission:  Personal Goals and Risk Factors at Admission - 11/05/23 0924       Core Components/Risk Factors/Patient Goals on Admission    Weight Management Weight Loss    Expected Outcomes Short Term: Continue to assess and modify interventions until short term weight is achieved;Long Term:  Adherence to nutrition and physical activity/exercise program aimed toward attainment of established weight goal;Weight Maintenance: Understanding of the daily nutrition guidelines, which includes 25-35% calories from fat, 7% or less cal from saturated fats, less than 200mg  cholesterol, less than 1.5gm of sodium, & 5 or more servings of fruits and vegetables daily;Understanding recommendations for meals to include 15-35% energy as protein, 25-35% energy from fat, 35-60% energy from carbohydrates, less than 200mg  of dietary cholesterol, 20-35 gm of total fiber daily;Weight Loss: Understanding of general recommendations for a balanced deficit meal plan, which promotes 1-2 lb weight loss per week and includes a negative energy balance of [phone]  kcal/d;Understanding of distribution of calorie intake throughout the day with the consumption of 4-5 meals/snacks    Improve shortness of breath with ADL's Yes    Intervention Provide education, individualized exercise plan and daily activity instruction to help decrease symptoms of SOB with activities of daily living.    Expected Outcomes Short Term: Improve cardiorespiratory fitness to achieve a reduction of symptoms when performing ADLs;Long Term: Be able to perform more ADLs without symptoms or delay the onset of symptoms             Core Components/Risk Factors/Patient Goals Review:   Goals and Risk Factor Review     Row Name 11/14/23 1302             Core Components/Risk Factors/Patient Goals Review   Personal Goals Review Weight Management/Obesity;Improve shortness of breath with ADL's;Develop more efficient breathing techniques such as purse lipped breathing and diaphragmatic breathing and practicing self-pacing with activity.       Review Marc Flores has not started PR class yet. Unable to assess goals at this time.       Expected Outcomes To improve shortness of breath with ADL's, develop more efficient breathing techniques such as purse lipped  breathing and diaphragmatic breathing; and practicing self-pacing with activity and lose weight.                Core Components/Risk Factors/Patient Goals at Discharge (Final Review):   Goals and Risk Factor Review - 11/14/23 1302       Core Components/Risk Factors/Patient Goals Review   Personal Goals Review Weight Management/Obesity;Improve shortness of breath with ADL's;Develop more efficient breathing techniques such as purse lipped breathing and diaphragmatic breathing and practicing self-pacing with activity.    Review Marc Flores has not started PR class yet. Unable to assess goals at this time.    Expected Outcomes To improve shortness of breath with ADL's, develop more efficient breathing techniques such as purse lipped breathing and diaphragmatic breathing; and practicing self-pacing with activity and lose weight.             ITP Comments: Pt is making expected progress toward Pulmonary Rehab goals after completing 0 session(s). Recommend continued exercise, life style modification, education, and utilization of breathing techniques to increase stamina and strength, while also decreasing shortness of breath with exertion.  Dr. Mechele Collin is Medical Director for Pulmonary Rehab at [facility].     Comments:

## 2023-11-22 ENCOUNTER — Encounter (HOSPITAL_COMMUNITY)
Admission: RE | Admit: 2023-11-22 | Discharge: 2023-11-22 | Disposition: A | Discharge status: Home / Self Care | Payer: PPO | Source: Ambulatory Visit | Attending: Pulmonary Disease

## 2023-11-22 DIAGNOSIS — J449 Chronic obstructive pulmonary disease, unspecified: Secondary | ICD-10-CM | POA: Diagnosis not present

## 2023-11-22 NOTE — Progress Notes (Signed)
 Daily Session Note  Patient Details  Name: Marc Flores MRN: [id number] Date of Birth: 1941-06-09 Referring Provider:   Doristine Devoid Pulmonary Rehab Walk Test from 11/05/2023 in [facility] for [facility], Vascular, & Lung Health  Referring Provider [name]  [[name]]       Encounter Date: 11/22/2023  Check In:  Session Check In - 11/22/23 1024       Check-In   Supervising physician immediately available to respond to emergencies [facility] MD immediately available    Physician(s) Reather Littler, NP    [facility]    Staff Present Raford Pitcher, MS, ACSM-CEP, Exercise Physiologist;Mary Gerre Scull, RN, BSN;Samantha Belarus, RD, LDN;Tryson Lumley Idelle Crouch BS, ACSM-CEP, Exercise Physiologist;Casey Katrinka Blazing, RT    Virtual Visit No    Medication changes reported     No    Fall or balance concerns reported    No    Tobacco Cessation No Change    Warm-up and Cool-down Not performed (comment)    Resistance Training Performed Yes    VAD Patient? No    PAD/SET Patient? No      Pain Assessment   Currently in Pain? No/denies    Multiple Pain Sites No             Capillary Blood Glucose: No results found for this or any previous visit (from the past 24 hours).    Social History   Tobacco Use  Smoking Status Former   Current packs/day: 0.00   Average packs/day: 1 pack/day for 30.0 years (30.0 ttl pk-yrs)   Types: Cigarettes   Start date: 12/07/1961   Quit date: 12/08/1991   Years since quitting: 31.9   Passive exposure: Past  Smokeless Tobacco Never    Goals Met:  Exercise tolerated well No report of concerns or symptoms today Strength training completed today  Goals Unmet:  Not Applicable  Comments: Service time is from 1006 to 1133.    Dr. Mechele Collin is Medical Director for Pulmonary Rehab at [facility].

## 2023-11-27 ENCOUNTER — Encounter (HOSPITAL_COMMUNITY)
Admission: RE | Admit: 2023-11-27 | Discharge: 2023-11-27 | Disposition: A | Discharge status: Home / Self Care | Payer: PPO | Source: Ambulatory Visit | Attending: Pulmonary Disease

## 2023-11-27 DIAGNOSIS — J449 Chronic obstructive pulmonary disease, unspecified: Secondary | ICD-10-CM | POA: Diagnosis not present

## 2023-11-27 NOTE — Progress Notes (Signed)
 Daily Session Note  Patient Details  Name: Marc Flores MRN: [id number] Date of Birth: 1941-04-25 Referring Provider:   Doristine Devoid Pulmonary Rehab Walk Test from 11/05/2023 in [facility] for Heart, Vascular, & Lung Health  Referring Provider [name]  [[name]]       Encounter Date: 11/27/2023  Check In:  Session Check In - 11/27/23 1026       Check-In   Supervising physician immediately available to respond to emergencies [facility] MD immediately available    Physician(s) Hoover Browns, NP    [facility]    Staff Present Raford Pitcher, MS, ACSM-CEP, Exercise Physiologist;Khyli Swaim Gerre Scull, RN, BSN;Samantha Belarus, RD, LDN;Randi Idelle Crouch BS, ACSM-CEP, Exercise Physiologist;Casey Katrinka Blazing, RT    Virtual Visit No    Medication changes reported     No    Fall or balance concerns reported    No    Tobacco Cessation No Change    Warm-up and Cool-down Performed as group-led instruction    Resistance Training Performed Yes    VAD Patient? No    PAD/SET Patient? No      Pain Assessment   Currently in Pain? No/denies    Multiple Pain Sites No             Capillary Blood Glucose: No results found for this or any previous visit (from the past 24 hours).    Social History   Tobacco Use  Smoking Status Former   Current packs/day: 0.00   Average packs/day: 1 pack/day for 30.0 years (30.0 ttl pk-yrs)   Types: Cigarettes   Start date: 12/07/1961   Quit date: 12/08/1991   Years since quitting: 31.9   Passive exposure: Past  Smokeless Tobacco Never    Goals Met:  Independence with exercise equipment Exercise tolerated well No report of concerns or symptoms today Strength training completed today  Goals Unmet:  HR above THR, had to lower speed and workload  Comments: Service time is from 1008 to 1128    Dr. Mechele Collin is Medical Director for Pulmonary Rehab at [facility].

## 2023-11-29 ENCOUNTER — Encounter (HOSPITAL_COMMUNITY)
Admission: RE | Admit: 2023-11-29 | Discharge: 2023-11-29 | Disposition: A | Discharge status: Home / Self Care | Payer: PPO | Source: Ambulatory Visit | Attending: Pulmonary Disease

## 2023-11-29 DIAGNOSIS — J449 Chronic obstructive pulmonary disease, unspecified: Secondary | ICD-10-CM | POA: Diagnosis not present

## 2023-11-29 NOTE — Progress Notes (Signed)
 Daily Session Note  Patient Details  Name: Marc Flores MRN: [id number] Date of Birth: 05-06-1941 Referring Provider:   Doristine Devoid Pulmonary Rehab Walk Test from 11/05/2023 in [facility] for Heart, Vascular, & Lung Health  Referring Provider [name]  [[name]]       Encounter Date: 11/29/2023  Check In:  Session Check In - 11/29/23 1027       Check-In   Supervising physician immediately available to respond to emergencies [facility] MD immediately available    Physician(s) Jari Favre, PA    [facility]    Staff Present Raford Pitcher, MS, ACSM-CEP, Exercise Physiologist;Mary Gerre Scull, RN, BSN;Samantha Belarus, RD, LDN;Amaira Safley Idelle Crouch BS, ACSM-CEP, Exercise Physiologist;Casey Katrinka Blazing, RT    Virtual Visit No    Medication changes reported     No    Fall or balance concerns reported    No    Tobacco Cessation No Change    Warm-up and Cool-down Performed as group-led instruction    Resistance Training Performed Yes    VAD Patient? No    PAD/SET Patient? No      Pain Assessment   Currently in Pain? No/denies             Capillary Blood Glucose: No results found for this or any previous visit (from the past 24 hours).    Social History   Tobacco Use  Smoking Status Former   Current packs/day: 0.00   Average packs/day: 1 pack/day for 30.0 years (30.0 ttl pk-yrs)   Types: Cigarettes   Start date: 12/07/1961   Quit date: 12/08/1991   Years since quitting: 31.9   Passive exposure: Past  Smokeless Tobacco Never    Goals Met:  Exercise tolerated well No report of concerns or symptoms today Strength training completed today  Goals Unmet:  Not Applicable  Comments: Service time is from 1009 to 1136.    Dr. Mechele Collin is Medical Director for Pulmonary Rehab at [facility].

## 2023-12-04 ENCOUNTER — Encounter (HOSPITAL_COMMUNITY)
Admission: RE | Admit: 2023-12-04 | Discharge: 2023-12-04 | Disposition: A | Discharge status: Home / Self Care | Payer: PPO | Source: Ambulatory Visit | Attending: Pulmonary Disease | Admitting: Pulmonary Disease

## 2023-12-04 ENCOUNTER — Telehealth: Payer: Self-pay | Admitting: Family Medicine

## 2023-12-04 VITALS — Wt 177.5 lb

## 2023-12-04 DIAGNOSIS — J449 Chronic obstructive pulmonary disease, unspecified: Secondary | ICD-10-CM | POA: Insufficient documentation

## 2023-12-04 NOTE — Telephone Encounter (Signed)
 FYI Pt Handicap Placard has been received and placed in Dr Clent Ridges red folder. Pt will be notified when form is ready for pick up

## 2023-12-04 NOTE — Telephone Encounter (Signed)
 Patient dropped off document Handicap Placard, to be filled out by provider. Patient requested to send it back via Call Patient to pick up within 7-days. Document is located in providers tray at front office.Please advise at Mobile [phone] (mobile)

## 2023-12-04 NOTE — Progress Notes (Signed)
 Daily Session Note  Patient Details  Name: Marc Flores MRN: [id number] Date of Birth: Sep 29, 1940 Referring Provider:   Doristine Devoid Pulmonary Rehab Walk Test from 11/05/2023 in [facility] for Heart, Vascular, & Lung Health  Referring Provider [name]  [[name]]       Encounter Date: 12/04/2023  Check In:  Session Check In - 12/04/23 1044       Check-In   Supervising physician immediately available to respond to emergencies [facility] immediately available    Physician(s) Jari Favre, PA    [facility]    Staff Present Raford Pitcher, MS, ACSM-CEP, Exercise Physiologist;Liviya Santini Gerre Scull, RN, BSN;Samantha Belarus, RD, LDN;Randi Dionisio Paschal, ACSM-CEP, Exercise Physiologist;Casey Chester Holstein, MS, Exercise Physiologist    Virtual Visit No    Medication changes reported     No    Fall or balance concerns reported    No    Tobacco Cessation No Change    Warm-up and Cool-down Performed as group-led instruction    Resistance Training Performed Yes    VAD Patient? No    PAD/SET Patient? No      Pain Assessment   Currently in Pain? No/denies    Multiple Pain Sites No             Capillary Blood Glucose: No results found for this or any previous visit (from the past 24 hours).   Exercise Prescription Changes - 12/04/23 1200       Response to Exercise   Blood Pressure (Admit) 108/62    Blood Pressure (Exercise) 150/82    Blood Pressure (Exit) 112/64    Heart Rate (Admit) 68 bpm    Heart Rate (Exercise) 109 bpm    Heart Rate (Exit) 89 bpm    Oxygen Saturation (Admit) 98 %   2L   Oxygen Saturation (Exercise) 91 %   2L   Oxygen Saturation (Exit) 93 %   RA   Rating of Perceived Exertion (Exercise) 13    Perceived Dyspnea (Exercise) 2    Duration Continue with 30 min of aerobic exercise without signs/symptoms of physical distress.    Intensity THRR unchanged      Progression   Progression Continue to progress workloads  to maintain intensity without signs/symptoms of physical distress.      Resistance Training   Training Prescription Yes    Weight blue bands    Reps 10-15    Time 10 Minutes      Oxygen   Oxygen Continuous    Liters 0-2      Treadmill   MPH 1.5    Grade 0    Minutes 15    METs 2.15      NuStep   Level 2    SPM 75    Minutes 15    METs 2      Oxygen   Maintain Oxygen Saturation 88% or higher             Social History   Tobacco Use  Smoking Status Former   Current packs/day: 0.00   Average packs/day: 1 pack/day for 30.0 years (30.0 ttl pk-yrs)   Types: Cigarettes   Start date: 12/07/1961   Quit date: 12/08/1991   Years since quitting: 32.0   Passive exposure: Past  Smokeless Tobacco Never    Goals Met:  Exercise tolerated well No report of concerns or symptoms today Strength training completed today  Goals Unmet:  Not Applicable  Comments: Service  time is from 1010 to 1134    Dr. Mechele Collin is Medical Director for Pulmonary Rehab at [facility].

## 2023-12-04 NOTE — Telephone Encounter (Signed)
The form is ready

## 2023-12-05 NOTE — Telephone Encounter (Signed)
 Pt inform to pick up form from the front office pt will pick up tomorrow

## 2023-12-05 NOTE — Telephone Encounter (Signed)
 Pt form placed in the filing cabinet in the front office.

## 2023-12-06 ENCOUNTER — Encounter (HOSPITAL_COMMUNITY)
Admission: RE | Admit: 2023-12-06 | Discharge: 2023-12-06 | Disposition: A | Discharge status: Home / Self Care | Payer: PPO | Source: Ambulatory Visit | Attending: Pulmonary Disease | Admitting: Pulmonary Disease

## 2023-12-06 DIAGNOSIS — J449 Chronic obstructive pulmonary disease, unspecified: Secondary | ICD-10-CM

## 2023-12-06 NOTE — Progress Notes (Signed)
 Daily Session Note  Patient Details  Name: Marc Flores MRN: [id number] Date of Birth: 15-Jun-1941 Referring Provider:   Doristine Devoid Pulmonary Rehab Walk Test from 11/05/2023 in [facility] for Heart, Vascular, & Lung Health  Referring Provider [name]  [[name]]       Encounter Date: 12/06/2023  Check In:  Session Check In - 12/06/23 1026       Check-In   Supervising physician immediately available to respond to emergencies [facility] MD immediately available    Physician(s) Carlyon Shadow, NP    [facility]    Staff Present Raford Pitcher, MS, ACSM-CEP, Exercise Physiologist;Mary Gerre Scull, RN, BSN;Samantha Belarus, RD, LDN;Randi Idelle Crouch BS, ACSM-CEP, Exercise Physiologist;Linh Johannes Katrinka Blazing, RT    Virtual Visit No    Medication changes reported     No    Fall or balance concerns reported    No    Tobacco Cessation No Change    Warm-up and Cool-down Performed as group-led instruction    Resistance Training Performed Yes    VAD Patient? No    PAD/SET Patient? No      Pain Assessment   Currently in Pain? No/denies             Capillary Blood Glucose: No results found for this or any previous visit (from the past 24 hours).    Social History   Tobacco Use  Smoking Status Former   Current packs/day: 0.00   Average packs/day: 1 pack/day for 30.0 years (30.0 ttl pk-yrs)   Types: Cigarettes   Start date: 12/07/1961   Quit date: 12/08/1991   Years since quitting: 32.0   Passive exposure: Past  Smokeless Tobacco Never    Goals Met:  Proper associated with RPD/PD & O2 Sat Independence with exercise equipment Exercise tolerated well No report of concerns or symptoms today Strength training completed today  Goals Unmet:  Not Applicable  Comments: Service time is from 1006 to 1129.    Dr. Mechele Collin is Medical Director for Pulmonary Rehab at [facility].

## 2023-12-11 ENCOUNTER — Encounter (HOSPITAL_COMMUNITY)
Admission: RE | Admit: 2023-12-11 | Discharge: 2023-12-11 | Disposition: A | Discharge status: Home / Self Care | Payer: PPO | Source: Ambulatory Visit | Attending: Pulmonary Disease | Admitting: Pulmonary Disease

## 2023-12-11 DIAGNOSIS — J449 Chronic obstructive pulmonary disease, unspecified: Secondary | ICD-10-CM

## 2023-12-11 NOTE — Progress Notes (Signed)
 Daily Session Note  Patient Details  Name: Marc Flores MRN: [id number] Date of Birth: May 31, 1941 Referring Provider:   Doristine Devoid Pulmonary Rehab Walk Test from 11/05/2023 in [facility] for Heart, Vascular, & Lung Health  Referring Provider [name]  [[name]]       Encounter Date: 12/11/2023  Check In:  Session Check In - 12/11/23 1108       Check-In   Supervising physician immediately available to respond to emergencies [facility] MD immediately available    Physician(s) Rise Paganini, NP    [facility]    Staff Present Raford Pitcher, MS, ACSM-CEP, Exercise Physiologist;Mary Gerre Scull, RN, BSN;Samantha Belarus, RD, LDN;Randi Idelle Crouch BS, ACSM-CEP, Exercise Physiologist;Albertus Chiarelli Katrinka Blazing, RT    Virtual Visit No    Medication changes reported     No    Fall or balance concerns reported    No    Tobacco Cessation No Change    Warm-up and Cool-down Performed as group-led instruction    Resistance Training Performed Yes    VAD Patient? No    PAD/SET Patient? No      Pain Assessment   Currently in Pain? No/denies    Multiple Pain Sites No             Capillary Blood Glucose: No results found for this or any previous visit (from the past 24 hours).    Social History   Tobacco Use  Smoking Status Former   Current packs/day: 0.00   Average packs/day: 1 pack/day for 30.0 years (30.0 ttl pk-yrs)   Types: Cigarettes   Start date: 12/07/1961   Quit date: 12/08/1991   Years since quitting: 32.0   Passive exposure: Past  Smokeless Tobacco Never    Goals Met:  Proper associated with RPD/PD & O2 Sat Independence with exercise equipment Exercise tolerated well No report of concerns or symptoms today Strength training completed today  Goals Unmet:  Not Applicable  Comments: Service time is from 1012 to 1129.    Dr. Mechele Collin is Medical Director for Pulmonary Rehab at [facility].

## 2023-12-13 ENCOUNTER — Encounter (HOSPITAL_COMMUNITY)
Admission: RE | Admit: 2023-12-13 | Discharge: 2023-12-13 | Disposition: A | Discharge status: Home / Self Care | Payer: PPO | Source: Ambulatory Visit | Attending: Pulmonary Disease | Admitting: Pulmonary Disease

## 2023-12-13 ENCOUNTER — Other Ambulatory Visit: Payer: Self-pay

## 2023-12-13 ENCOUNTER — Emergency Department (HOSPITAL_COMMUNITY)
Admission: EM | Admit: 2023-12-13 | Discharge: 2023-12-14 | Disposition: A | Discharge status: Home / Self Care | Attending: Emergency Medicine | Admitting: Emergency Medicine

## 2023-12-13 ENCOUNTER — Emergency Department (HOSPITAL_COMMUNITY)

## 2023-12-13 DIAGNOSIS — R0602 Shortness of breath: Secondary | ICD-10-CM | POA: Diagnosis present

## 2023-12-13 DIAGNOSIS — J441 Chronic obstructive pulmonary disease with (acute) exacerbation: Secondary | ICD-10-CM | POA: Diagnosis not present

## 2023-12-13 DIAGNOSIS — Z8551 Personal history of malignant neoplasm of bladder: Secondary | ICD-10-CM | POA: Insufficient documentation

## 2023-12-13 DIAGNOSIS — J449 Chronic obstructive pulmonary disease, unspecified: Secondary | ICD-10-CM

## 2023-12-13 DIAGNOSIS — R7989 Other specified abnormal findings of blood chemistry: Secondary | ICD-10-CM | POA: Insufficient documentation

## 2023-12-13 NOTE — Progress Notes (Signed)
 Daily Session Note  Patient Details  Name: Marc Flores MRN: [id number] Date of Birth: [birth year] Referring Provider:   Doristine Devoid Pulmonary Rehab Walk Test from 11/05/2023 in [facility] for Heart, Vascular, & Lung Health  Referring Provider [name]  [[name]]       Encounter Date: 12/13/2023  Check In:  Session Check In - 12/13/23 1025       Check-In   Supervising physician immediately available to respond to emergencies [facility] MD immediately available    Physician(s) Robin Searing NP    [facility]    Staff Present Raford Pitcher, MS, ACSM-CEP, Exercise Physiologist;Mary Gerre Scull, RN, BSN;Samantha Belarus, RD, LDN;Randi Idelle Crouch BS, ACSM-CEP, Exercise Physiologist;Sunset Joshi Katrinka Blazing, RT    Virtual Visit No    Medication changes reported     No    Fall or balance concerns reported    No    Tobacco Cessation No Change    Warm-up and Cool-down Performed as group-led instruction    Resistance Training Performed Yes    VAD Patient? No    PAD/SET Patient? No      Pain Assessment   Currently in Pain? No/denies             Capillary Blood Glucose: No results found for this or any previous visit (from the past 24 hours).    Social History   Tobacco Use  Smoking Status Former   Current packs/day: 0.00   Average packs/day: 1 pack/day for 30.0 years (30.0 ttl pk-yrs)   Types: Cigarettes   Start date: 12/07/1961   Quit date: 12/08/1991   Years since quitting: 32.0   Passive exposure: Past  Smokeless Tobacco Never    Goals Met:  Proper associated with RPD/PD & O2 Sat Independence with exercise equipment Exercise tolerated well No report of concerns or symptoms today Strength training completed today  Goals Unmet:  Not Applicable  Comments: Service time is from 1007 to 1135.    Dr. Mechele Collin is Medical Director for Pulmonary Rehab at [facility].

## 2023-12-13 NOTE — ED Triage Notes (Signed)
 Patient reports SOB this evening , history of COPD , denies chest pain , no cough or fever .

## 2023-12-14 ENCOUNTER — Emergency Department (HOSPITAL_COMMUNITY)

## 2023-12-14 LAB — CBC WITH DIFFERENTIAL/PLATELET
Abs Immature Granulocytes: 0.02 10*3/uL (ref 0.00–0.07)
Basophils Absolute: 0.1 10*3/uL (ref 0.0–0.1)
Basophils Relative: 1 %
Eosinophils Absolute: 0.4 10*3/uL (ref 0.0–0.5)
Eosinophils Relative: 5 %
HCT: 53.3 % — ABNORMAL HIGH (ref 39.0–52.0)
Hemoglobin: 17.3 g/dL — ABNORMAL HIGH (ref 13.0–17.0)
Immature Granulocytes: 0 %
Lymphocytes Relative: 28 %
Lymphs Abs: 1.8 10*3/uL (ref 0.7–4.0)
MCH: 30.1 pg (ref 26.0–34.0)
MCHC: 32.5 g/dL (ref 30.0–36.0)
MCV: 92.7 fL (ref 80.0–100.0)
Monocytes Absolute: 0.7 10*3/uL (ref 0.1–1.0)
Monocytes Relative: 11 %
Neutro Abs: 3.6 10*3/uL (ref 1.7–7.7)
Neutrophils Relative %: 55 %
Platelets: 223 10*3/uL (ref 150–400)
RBC: 5.75 MIL/uL (ref 4.22–5.81)
RDW: 13.4 % (ref 11.5–15.5)
WBC: 6.6 10*3/uL (ref 4.0–10.5)
nRBC: 0 % (ref 0.0–0.2)

## 2023-12-14 LAB — COMPREHENSIVE METABOLIC PANEL WITH GFR
ALT: 20 U/L (ref 0–44)
AST: 22 U/L (ref 15–41)
Albumin: 4.2 g/dL (ref 3.5–5.0)
Alkaline Phosphatase: 87 U/L (ref 38–126)
Anion gap: 11 (ref 5–15)
BUN: 18 mg/dL (ref 8–23)
CO2: 25 mmol/L (ref 22–32)
Calcium: 9.4 mg/dL (ref 8.9–10.3)
Chloride: 103 mmol/L (ref 98–111)
Creatinine, Ser: 1.52 mg/dL — ABNORMAL HIGH (ref 0.61–1.24)
GFR, Estimated: 46 mL/min — ABNORMAL LOW (ref >60)
Glucose, Bld: 100 mg/dL — ABNORMAL HIGH (ref 70–99)
Potassium: 4.2 mmol/L (ref 3.5–5.1)
Sodium: 139 mmol/L (ref 135–145)
Total Bilirubin: 1.1 mg/dL (ref 0.0–1.2)
Total Protein: 7.1 g/dL (ref 6.5–8.1)

## 2023-12-14 LAB — TROPONIN I (HIGH SENSITIVITY)
Troponin I (High Sensitivity): 10 ng/L (ref <18)
Troponin I (High Sensitivity): 11 ng/L (ref <18)

## 2023-12-14 MED ORDER — AEROCHAMBER PLUS FLO-VU LARGE MISC
1.0000 | Freq: Once | Status: AC
  Administered 2023-12-14: 1

## 2023-12-14 MED ORDER — ALBUTEROL SULFATE HFA 108 (90 BASE) MCG/ACT IN AERS
2.0000 | INHALATION_SPRAY | Freq: Once | RESPIRATORY_TRACT | Status: AC
  Administered 2023-12-14: 2 via RESPIRATORY_TRACT
  Filled 2023-12-14: qty 6.7

## 2023-12-14 MED ORDER — IPRATROPIUM-ALBUTEROL 0.5-2.5 (3) MG/3ML IN SOLN
3.0000 mL | Freq: Once | RESPIRATORY_TRACT | Status: AC
  Administered 2023-12-14: 3 mL via RESPIRATORY_TRACT
  Filled 2023-12-14: qty 3

## 2023-12-14 MED ORDER — DOXYCYCLINE HYCLATE 100 MG PO CAPS: 100.0000 mg | ORAL_CAPSULE | Freq: Two times a day (BID) | ORAL | 0 refills | Status: DC

## 2023-12-14 MED ORDER — CETIRIZINE HCL 10 MG PO TABS: 10.0000 mg | ORAL_TABLET | Freq: Every day | ORAL | 0 refills | Status: AC

## 2023-12-14 MED ORDER — PREDNISONE 50 MG PO TABS: 50.0000 mg | ORAL_TABLET | Freq: Every day | ORAL | 0 refills | Status: DC

## 2023-12-14 MED ORDER — METHYLPREDNISOLONE SODIUM SUCC 125 MG IJ SOLR
125.0000 mg | INTRAMUSCULAR | Status: AC
Start: 2023-12-14 — End: 2023-12-14
  Administered 2023-12-14: 125 mg via INTRAVENOUS
  Filled 2023-12-14: qty 2

## 2023-12-14 MED ORDER — LORATADINE 10 MG PO TABS
10.0000 mg | ORAL_TABLET | Freq: Once | ORAL | Status: AC
  Administered 2023-12-14: 10 mg via ORAL
  Filled 2023-12-14: qty 1

## 2023-12-14 NOTE — ED Provider Notes (Signed)
 [place] EMERGENCY DEPARTMENT AT [facility] Provider Note   CSN: [id number] Arrival date & time: 12/13/23  2345     History  Chief Complaint  Patient presents with   Shortness of Breath    COPD    Marc Flores is a 83 y.o. male.  The history is provided by the patient.  Shortness of Breath Severity:  Moderate Onset quality:  Sudden Timing:  Constant Progression:  Unchanged Chronicity:  Recurrent Context: activity, pollens and weather changes   Context: not smoke exposure   Relieved by:  Nothing Worsened by:  Nothing Ineffective treatments:  Inhaler Associated symptoms: wheezing   Associated symptoms: no chest pain, no fever, no neck pain, no sore throat and no sputum production   Risk factors comment:  COPD COPD patient on home o2 with wheezing and SOB.      Past Medical History:  Diagnosis Date   Arthritis    Bruises easily    Cancer (HCC)    BLADDER, sees Dr. Retta Diones   COPD (chronic obstructive pulmonary disease) [facility])    sees Dr. Sherene Sires     Diverticulitis    Dyspnea    ON EXERTION   History of chickenpox    Hyperlipidemia      Home Medications Prior to Admission medications   Medication Sig Start Date End Date Taking? Authorizing Provider  cetirizine (ZYRTEC ALLERGY) 10 MG tablet Take 1 tablet (10 mg total) by mouth daily. 12/14/23  Yes Nazair Fortenberry, MD  doxycycline (VIBRAMYCIN) 100 MG capsule Take 1 capsule (100 mg total) by mouth 2 (two) times daily. One po bid x 7 days 12/14/23  Yes Wenzel Backlund, MD  predniSONE (DELTASONE) 50 MG tablet Take 1 tablet (50 mg total) by mouth daily. 12/14/23  Yes Keaden Gunnoe, MD  albuterol (VENTOLIN HFA) 108 (90 Base) MCG/ACT inhaler Inhale 2 puffs into the lungs every 6 (six) hours as needed for shortness of breath or wheezing.    [provider]  atorvastatin (LIPITOR) 10 MG tablet TAKE 1 TABLET(10 MG) BY MOUTH DAILY 03/29/23   Nelwyn Salisbury, MD  cholecalciferol (VITAMIN D3) 25 MCG (1000  UNIT) tablet Take 1,000 Units by mouth daily.    [provider]  colchicine 0.6 MG tablet TAKE 2 TABLETS BY MOUTH TWICE DAILY AS NEEDED FOR GOUT FLARE 01/01/23   Nelwyn Salisbury, MD  famotidine (PEPCID) 20 MG tablet One after supper Patient taking differently: Take 20 mg by mouth daily after breakfast. 02/13/23   Nyoka Cowden, MD  ipratropium-albuterol (DUONEB) 0.5-2.5 (3) MG/3ML SOLN Take 3 mLs by nebulization 2 (two) times daily. 05/31/23   [provider]  metoprolol succinate (TOPROL-XL) 25 MG 24 hr tablet Take 1 tablet (25 mg total) by mouth daily. 11/16/23   Nelwyn Salisbury, MD  mometasone [facility]) 220 MCG/ACT inhaler Inhale 2 puffs into the lungs at bedtime. 07/12/23   [provider]  montelukast (SINGULAIR) 10 MG tablet Take 10 mg by mouth daily. 05/31/23   [provider]  pantoprazole (PROTONIX) 40 MG tablet TAKE 1 TABLET(40 MG) BY MOUTH DAILY 30 TO 60 MINUTES BEFORE FIRST MEAL OF THE DAY 04/30/23   Nyoka Cowden, MD  tamsulosin (FLOMAX) 0.4 MG CAPS capsule Take 0.4 mg by mouth daily after supper. 08/21/18   [provider]  Tiotropium Bromide-Olodaterol 2.5-2.5 MCG/ACT AERS Take 2 puffs by mouth daily. 07/12/23   [provider]      Allergies    Patient has no  known allergies.    Review of Systems   Review of Systems  Constitutional:  Negative for fever.  HENT:  Negative for sore throat.   Respiratory:  Positive for shortness of breath and wheezing. Negative for sputum production and chest tightness.   Cardiovascular:  Negative for chest pain.  Musculoskeletal:  Negative for neck pain.  All other systems reviewed and are negative.   Physical Exam Updated Vital Signs BP 116/69   Pulse 63   Temp (!) 97.4 F (36.3 C)   Resp (!) 25   SpO2 98%  Physical Exam Vitals and nursing note reviewed.  Constitutional:      General: He is not in acute distress.    Appearance: He is well-developed. He is not diaphoretic.  HENT:      Head: Normocephalic and atraumatic.     Nose: Nose normal.  Eyes:     Conjunctiva/sclera: Conjunctivae normal.     Pupils: Pupils are equal, round, and reactive to light.  Cardiovascular:     Rate and Rhythm: Normal rate and regular rhythm.     Pulses: Normal pulses.     Heart sounds: Normal heart sounds.  Pulmonary:     Effort: Pulmonary effort is normal.     Breath sounds: Wheezing present. No rales.  Abdominal:     General: Bowel sounds are normal.     Palpations: Abdomen is soft.     Tenderness: There is no abdominal tenderness. There is no guarding or rebound.  Musculoskeletal:        General: Normal range of motion.     Cervical back: Normal range of motion and neck supple.  Skin:    General: Skin is warm and dry.     Capillary Refill: Capillary refill takes less than 2 seconds.  Neurological:     General: No focal deficit present.     Mental Status: He is alert and oriented to person, place, and time.     Deep Tendon Reflexes: Reflexes normal.  Psychiatric:        Mood and Affect: Mood normal.        Behavior: Behavior normal.     ED Results / Procedures / Treatments   Labs (all labs ordered are listed, but only abnormal results are displayed) Results for orders placed or performed during the hospital encounter of 12/13/23  CBC with Differential   Collection Time: 12/14/23 12:03 AM  Result Value Ref Range   WBC 6.6 4.0 - 10.5 K/uL   RBC 5.75 4.22 - 5.81 MIL/uL   Hemoglobin 17.3 (H) 13.0 - 17.0 g/dL   HCT 96.2 (H) 95.2 - 84.1 %   MCV 92.7 80.0 - 100.0 fL   MCH 30.1 26.0 - 34.0 pg   MCHC 32.5 30.0 - 36.0 g/dL   RDW 32.4 40.1 - 02.7 %   Platelets 223 150 - 400 K/uL   nRBC 0.0 0.0 - 0.2 %   Neutrophils Relative % 55 %   Neutro Abs 3.6 1.7 - 7.7 K/uL   Lymphocytes Relative 28 %   Lymphs Abs 1.8 0.7 - 4.0 K/uL   Monocytes Relative 11 %   Monocytes Absolute 0.7 0.1 - 1.0 K/uL   Eosinophils Relative 5 %   Eosinophils Absolute 0.4 0.0 - 0.5 K/uL   Basophils  Relative 1 %   Basophils Absolute 0.1 0.0 - 0.1 K/uL   Immature Granulocytes 0 %   Abs Immature Granulocytes 0.02 0.00 - 0.07 K/uL  Comprehensive metabolic panel  Collection Time: 12/14/23 12:03 AM  Result Value Ref Range   Sodium 139 135 - 145 mmol/L   Potassium 4.2 3.5 - 5.1 mmol/L   Chloride 103 98 - 111 mmol/L   CO2 25 22 - 32 mmol/L   Glucose, Bld 100 (H) 70 - 99 mg/dL   BUN 18 8 - 23 mg/dL   Creatinine, Ser 2.95 (H) 0.61 - 1.24 mg/dL   Calcium 9.4 8.9 - 62.1 mg/dL   Total Protein 7.1 6.5 - 8.1 g/dL   Albumin 4.2 3.5 - 5.0 g/dL   AST 22 15 - 41 U/L   ALT 20 0 - 44 U/L   Alkaline Phosphatase 87 38 - 126 U/L   Total Bilirubin 1.1 0.0 - 1.2 mg/dL   GFR, Estimated 46 (L) >60 mL/min   Anion gap 11 5 - 15  Troponin I (High Sensitivity)   Collection Time: 12/14/23 12:03 AM  Result Value Ref Range   Troponin I (High Sensitivity) 10 <18 ng/L  Troponin I (High Sensitivity)   Collection Time: 12/14/23  1:49 AM  Result Value Ref Range   Troponin I (High Sensitivity) 11 <18 ng/L   DG Chest 2 View Result Date: 12/14/2023 CLINICAL DATA:  Shortness of breath and COPD. EXAM: CHEST - 2 VIEW COMPARISON:  None Available. FINDINGS: The heart size and mediastinal contours are within normal limits. There is marked severity calcification of the aortic arch. Evidence of emphysematous lung disease is noted with mild areas of scarring, atelectasis and/or infiltrate seen within the bilateral lung bases, left slightly greater than right. No pleural effusion or pneumothorax is identified. Multilevel degenerative changes seen throughout the thoracic spine. 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 Electronically Signed   By: Aram Candela M.D.   On: 12/14/2023 01:05     EKG EKG Interpretation Date/Time:  Friday Mikena Masoner 11 2025 00:02:39 EDT Ventricular Rate:  84 PR Interval:  180 QRS Duration:  70 QT Interval:  366 QTC Calculation: 432 R  Axis:   59  Text Interpretation: Sinus rhythm with Premature supraventricular complexes Confirmed by Mkenzie Dotts ([id number]) on 12/14/2023 1:11:20 AM  Radiology DG Chest 2 View Result Date: 12/14/2023 CLINICAL DATA:  Shortness of breath and COPD. EXAM: CHEST - 2 VIEW COMPARISON:  None Available. FINDINGS: The heart size and mediastinal contours are within normal limits. There is marked severity calcification of the aortic arch. Evidence of emphysematous lung disease is noted with mild areas of scarring, atelectasis and/or infiltrate seen within the bilateral lung bases, left slightly greater than right. No pleural effusion or pneumothorax is identified. Multilevel degenerative changes seen throughout the thoracic spine. 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 Electronically Signed   By: Aram Candela M.D.   On: 12/14/2023 01:05    Procedures Procedures    Medications Ordered in ED Medications  ipratropium-albuterol (DUONEB) 0.5-2.5 (3) MG/3ML nebulizer solution 3 mL (3 mLs Nebulization Given 12/14/23 0148)  loratadine (CLARITIN) tablet 10 mg (10 mg Oral Given 12/14/23 0148)  methylPREDNISolone sodium succinate (SOLU-MEDROL) 125 mg/2 mL injection 125 mg (125 mg Intravenous Given 12/14/23 0148)  albuterol (VENTOLIN HFA) 108 (90 Base) MCG/ACT inhaler 2 puff (2 puffs Inhalation Given 12/14/23 0410)  AeroChamber Plus Flo-Vu Large MISC 1 each (1 each Other Given 12/14/23 0411)    ED Course/ Medical Decision Making/ A&P  Medical Decision Making Patient with COPD and SOB and wheezing inhaler did not work   Amount and/or Complexity of Data Reviewed Independent Historian: spouse    Details: See above  External Data Reviewed: notes.    Details: Previous notes reviewed  Labs: ordered.    Details: 2 negative troponins 10/11.  Normal white count 6.6 elevated hemoglobin 17.3, normal platelets.  Normal sodium  and potassium, slight elevation of creatinine 1.52  Radiology: ordered and independent interpretation performed.    Details: COPD by me  ECG/medicine tests: ordered and independent interpretation performed.  Risk OTC drugs. Prescription drug management. Risk Details: Better post nebulizer treatment.  Albuterol teach and treat with inhaler and spacer performed so patient has access to this at home.  Recommend zyrtec daily.  Will start steroids and doxycycline and have patient patient follow up with PMD for ongoing care.      Final Clinical Impression(s) / ED Diagnoses Final diagnoses:  COPD exacerbation (HCC)    No signs of systemic illness or infection. The patient is nontoxic-appearing on exam and vital signs are within normal limits.  I have reviewed the triage vital signs and the nursing notes. Pertinent labs & imaging results that were available during my care of the patient were reviewed by me and considered in my medical decision making (see chart for details). After history, exam, and medical workup I feel the patient has been appropriately medically screened and is safe for discharge home. Pertinent diagnoses were discussed with the patient. Patient was given return precautions.      Rx / DC Orders ED Discharge Orders          Ordered    cetirizine (ZYRTEC ALLERGY) 10 MG tablet  Daily        12/14/23 0422    predniSONE (DELTASONE) 50 MG tablet  Daily        12/14/23 0422    doxycycline (VIBRAMYCIN) 100 MG capsule  2 times daily        12/14/23 0422              Wava Kildow, MD 12/14/23 1610

## 2023-12-14 NOTE — Progress Notes (Signed)
 RT instructed patient on how to use an inhaler w/spacer. This RT had pt demonstrate back to me.  Patient understands how to use MDI w/spacer successfully. Had no further questions and demonstrated affectively for home use.

## 2023-12-18 ENCOUNTER — Encounter (HOSPITAL_COMMUNITY)
Admission: RE | Admit: 2023-12-18 | Discharge: 2023-12-18 | Disposition: A | Discharge status: Home / Self Care | Payer: PPO | Source: Ambulatory Visit | Attending: Pulmonary Disease | Admitting: Pulmonary Disease

## 2023-12-18 VITALS — Wt 178.6 lb

## 2023-12-18 DIAGNOSIS — J449 Chronic obstructive pulmonary disease, unspecified: Secondary | ICD-10-CM

## 2023-12-18 NOTE — Progress Notes (Signed)
 Daily Session Note  Patient Details  Name: Marc Flores MRN: [id number] Date of Birth: Jan 30, 1941 Referring Provider:   [name] Pulmonary Rehab Walk Test from 11/05/2023 in [facility]  Referring Provider [name]  [[name]]       Encounter Date: 12/18/2023  Check In:  Session Check In - 12/18/23 1032       Check-In   Supervising physician immediately available to respond to emergencies [facility] MD immediately available    Physician(s) Palmer Bobo, NP    Location [facility]    Staff Present Atlas Lea, MS, ACSM-CEP, Exercise Physiologist;Mary Arlester Ladd, RN, BSN;Randi Regis Captain BS, ACSM-CEP, Exercise Physiologist;Casey Felipe Horton, RT    Virtual Visit No    Medication changes reported     No    Fall or balance concerns reported    No    Tobacco Cessation No Change    Warm-up and Cool-down Performed as group-led instruction    Resistance Training Performed Yes    VAD Patient? No    PAD/SET Patient? No      Pain Assessment   Currently in Pain? No/denies    Multiple Pain Sites No             Capillary Blood Glucose: No results found for this or any previous visit (from the past 24 hours).   Exercise Prescription Changes - 12/18/23 1200       Response to Exercise   Blood Pressure (Admit) 124/70    Blood Pressure (Exercise) 146/70    Blood Pressure (Exit) 130/60    Heart Rate (Admit) 77 bpm    Heart Rate (Exercise) 107 bpm    Heart Rate (Exit) 79 bpm    Oxygen Saturation (Admit) 96 %    Oxygen Saturation (Exercise) 92 %    Oxygen Saturation (Exit) 93 %    Rating of Perceived Exertion (Exercise) 12    Perceived Dyspnea (Exercise) 2    Duration Continue with 30 min of aerobic exercise without signs/symptoms of physical distress.    Intensity THRR unchanged      Progression   Progression Continue to progress workloads to maintain intensity without signs/symptoms of physical distress.       Resistance Training   Training Prescription Yes    Weight blue bands    Reps 10-15    Time 10 Minutes      Oxygen   Oxygen Continuous    Liters 2      Treadmill   MPH 1.5    Grade 0    Minutes 15    METs 2      NuStep   Level 3    SPM 76    Minutes 15    METs 2.3      Oxygen   Maintain Oxygen Saturation 88% or higher             Social History   Tobacco Use  Smoking Status Former   Current packs/day: 0.00   Average packs/day: 1 pack/day for 30.0 years (30.0 ttl pk-yrs)   Types: Cigarettes   Start date: 12/07/1961   Quit date: 12/08/1991   Years since quitting: 32.0   Passive exposure: Past  Smokeless Tobacco Never    Goals Met:  Proper associated with RPD/PD & O2 Sat Exercise tolerated well No report of concerns or symptoms today Strength training completed today  Goals Unmet:  Not Applicable  Comments: Service time is from 1007 to 1138.  Dr. Genetta Kenning is Medical Director for Pulmonary Rehab at [facility].

## 2023-12-19 NOTE — Progress Notes (Signed)
 Pulmonary Individual Treatment Plan  Patient Details  Name: Marc Flores MRN: [id number] Date of Birth: [birth year] Referring Provider:   Doristine Devoid Pulmonary Rehab Walk Test from 11/05/2023 in [facility]  Referring Provider [name]  [[name]]       Initial Encounter Date:  Flowsheet Row Pulmonary Rehab Walk Test from 11/05/2023 in [facility] for [facility]  Date 11/05/23       Visit Diagnosis: Chronic obstructive pulmonary disease, unspecified COPD type (HCC)  Patient's Home Medications on Admission:   Current Outpatient Medications:    albuterol (VENTOLIN HFA) 108 (90 Base) MCG/ACT inhaler, Inhale 2 puffs into the lungs every 6 (six) hours as needed for shortness of breath or wheezing., Disp: , Rfl:    atorvastatin (LIPITOR) 10 MG tablet, TAKE 1 TABLET(10 MG) BY MOUTH DAILY, Disp: 90 tablet, Rfl: 0   cetirizine (ZYRTEC ALLERGY) 10 MG tablet, Take 1 tablet (10 mg total) by mouth daily., Disp: 30 tablet, Rfl: 0   cholecalciferol (VITAMIN D3) 25 MCG (1000 UNIT) tablet, Take 1,000 Units by mouth daily., Disp: , Rfl:    colchicine 0.6 MG tablet, TAKE 2 TABLETS BY MOUTH TWICE DAILY AS NEEDED FOR GOUT FLARE, Disp: 360 tablet, Rfl: 0   doxycycline (VIBRAMYCIN) 100 MG capsule, Take 1 capsule (100 mg total) by mouth 2 (two) times daily. One po bid x 7 days, Disp: 14 capsule, Rfl: 0   famotidine (PEPCID) 20 MG tablet, One after supper (Patient taking differently: Take 20 mg by mouth daily after breakfast.), Disp: 30 tablet, Rfl: 11   ipratropium-albuterol (DUONEB) 0.5-2.5 (3) MG/3ML SOLN, Take 3 mLs by nebulization 2 (two) times daily., Disp: , Rfl:    metoprolol succinate (TOPROL-XL) 25 MG 24 hr tablet, Take 1 tablet (25 mg total) by mouth daily., Disp: 90 tablet, Rfl: 3   mometasone (ASMANEX) 220 MCG/ACT inhaler, Inhale 2 puffs into the lungs at bedtime., Disp: , Rfl:    montelukast  (SINGULAIR) 10 MG tablet, Take 10 mg by mouth daily., Disp: , Rfl:    pantoprazole (PROTONIX) 40 MG tablet, TAKE 1 TABLET(40 MG) BY MOUTH DAILY 30 TO 60 MINUTES BEFORE FIRST MEAL OF THE DAY, Disp: 30 tablet, Rfl: 5   predniSONE (DELTASONE) 50 MG tablet, Take 1 tablet (50 mg total) by mouth daily., Disp: 5 tablet, Rfl: 0   tamsulosin (FLOMAX) 0.4 MG CAPS capsule, Take 0.4 mg by mouth daily after supper., Disp: , Rfl:    Tiotropium Bromide-Olodaterol 2.5-2.5 MCG/ACT AERS, Take 2 puffs by mouth daily., Disp: , Rfl:  No current facility-administered medications for this encounter.  Facility-Administered Medications Ordered in Other Encounters:    gemcitabine (GEMZAR) chemo syringe for bladder instillation 2,000 mg, 2,000 mg, Bladder Instillation, Once, Marcine Matar, MD  Past Medical History: Past Medical History:  Diagnosis Date   Arthritis    Bruises easily    Cancer (HCC)    BLADDER, sees Dr. Retta Diones   COPD (chronic obstructive pulmonary disease) [facility]    sees Dr. Sherene Sires     Diverticulitis    Dyspnea    ON EXERTION   History of chickenpox    Hyperlipidemia     Tobacco Use: Social History   Tobacco Use  Smoking Status Former   Current packs/day: 0.00   Average packs/day: 1 pack/day for 30.0 years (30.0 ttl pk-yrs)   Types: Cigarettes   Start date: 12/07/1961   Quit date: 12/08/1991  Years since quitting: 32.0   Passive exposure: Past  Smokeless Tobacco Never    Labs: Review Flowsheet       Latest Ref Rng & Units 12/04/2009 05/12/2021  Labs for ITP Cardiac and Pulmonary Rehab  Cholestrol 0 - 200 mg/dL - 914   LDL (calc) 0 - 99 mg/dL - 81   HDL-C >78.29 mg/dL - 56.21   Trlycerides 0.0 - 149.0 mg/dL - 30.8   Hemoglobin M5H 4.6 - 6.5 % - 5.5   PH, Arterial 7.350 - 7.450 7.419  -  PCO2 arterial 35.0 - 45.0 mmHg 42.6  -  Bicarbonate 20.0 - 24.0 mEq/L 27.0  -  TCO2 0 - 100 mmol/L 28.4  -  O2 Saturation % 93.6  -    Capillary Blood Glucose: No results found for:  "GLUCAP"   Pulmonary Assessment Scores:  Pulmonary Assessment Scores     Row Name 11/05/23 0925         ADL UCSD   ADL Phase Entry     SOB Score total 29       CAT Score   CAT Score 12       mMRC Score   mMRC Score 4             UCSD: Self-administered rating of dyspnea associated with activities of daily living (ADLs) 6-point scale (0 = "not at all" to 5 = "maximal or unable to do because of breathlessness")  Scoring Scores range from 0 to 120.  Minimally important difference is 5 units  CAT: CAT can identify the health impairment of COPD patients and is better correlated with disease progression.  CAT has a scoring range of zero to 40. The CAT score is classified into four groups of low (less than 10), medium (10 - 20), high (21-30) and very high (31-40) based on the impact level of disease on health status. A CAT score over 10 suggests significant symptoms.  A worsening CAT score could be explained by an exacerbation, poor medication adherence, poor inhaler technique, or progression of COPD or comorbid conditions.  CAT MCID is 2 points  mMRC: mMRC (Modified Medical Research Council) Dyspnea Scale is used to assess the degree of baseline functional disability in patients of respiratory disease due to dyspnea. No minimal important difference is established. A decrease in score of 1 point or greater is considered a positive change.   Pulmonary Function Assessment:   Exercise Target Goals: Exercise Program Goal: Individual exercise prescription set using results from initial 6 min walk test and THRR while considering  patient's activity barriers and safety.   Exercise Prescription Goal: Initial exercise prescription builds to 30-45 minutes a day of aerobic activity, 2-3 days per week.  Home exercise guidelines will be given to patient during program as part of exercise prescription that the participant will acknowledge.  Activity Barriers & Risk Stratification:   Activity Barriers & Cardiac Risk Stratification - 11/05/23 0938       Activity Barriers & Cardiac Risk Stratification   Activity Barriers Arthritis;Deconditioning;Muscular Weakness;Shortness of Breath             6 Minute Walk:  6 Minute Walk     Row Name 11/05/23 1109         6 Minute Walk   Phase Initial     Distance 1110 feet     Walk Time 6 minutes     # of Rest Breaks 2  1:50-2:10, 3:37-4:00     MPH 2.1  METS 2.23     RPE 13     Perceived Dyspnea  4     VO2 Peak 7.81     Symptoms No     Resting HR 99 bpm     Resting BP 120/70     Resting Oxygen Saturation  92 %     Exercise Oxygen Saturation  during 6 min walk 85 %     Max Ex. HR 123 bpm     Max Ex. BP 148/76     2 Minute Post BP 132/70       Interval HR   1 Minute HR 110     2 Minute HR 121     3 Minute HR 122     4 Minute HR 119     5 Minute HR 121     6 Minute HR 123     2 Minute Post HR 123     Interval Heart Rate? Yes       Interval Oxygen   Interval Oxygen? Yes     Baseline Oxygen Saturation % 92 %     1 Minute Oxygen Saturation % 95 %     1 Minute Liters of Oxygen 0 L     2 Minute Oxygen Saturation % 91 %  85% @ 1:50, standing rest break till pt reached 91%     2 Minute Liters of Oxygen 0 L     3 Minute Oxygen Saturation % 89 %     3 Minute Liters of Oxygen 0 L     4 Minute Oxygen Saturation % 92 %  86% @ 3:37     4 Minute Liters of Oxygen 0 L  increased to 1L     5 Minute Oxygen Saturation % 92 %     5 Minute Liters of Oxygen 1 L     6 Minute Oxygen Saturation % 90 %     6 Minute Liters of Oxygen 1 L     2 Minute Post Oxygen Saturation % 95 %     2 Minute Post Liters of Oxygen 1 L              Oxygen Initial Assessment:  Oxygen Initial Assessment - 11/05/23 0925       Home Oxygen   Home Oxygen Device None    Sleep Oxygen Prescription None    Home Exercise Oxygen Prescription None    Home Resting Oxygen Prescription None      Initial 6 min Walk   Oxygen Used Continuous     Liters per minute 1      Program Oxygen Prescription   Program Oxygen Prescription Continuous    Liters per minute 1      Intervention   Short Term Goals To learn and exhibit compliance with exercise, home and travel O2 prescription;To learn and understand importance of maintaining oxygen saturations>88%;To learn and demonstrate proper use of respiratory medications;To learn and understand importance of monitoring SPO2 with pulse oximeter and demonstrate accurate use of the pulse oximeter.;To learn and demonstrate proper pursed lip breathing techniques or other breathing techniques.     Long  Term Goals Exhibits compliance with exercise, home  and travel O2 prescription;Verbalizes importance of monitoring SPO2 with pulse oximeter and return demonstration;Maintenance of O2 saturations>88%;Exhibits proper breathing techniques, such as pursed lip breathing or other method taught during program session;Compliance with respiratory medication;Demonstrates proper use of MDI's  Oxygen Re-Evaluation:  Oxygen Re-Evaluation     Row Name 11/19/23 0935 12/12/23 1001           Program Oxygen Prescription   Program Oxygen Prescription Continuous Continuous      Liters per minute 1 1        Home Oxygen   Home Oxygen Device None None      Sleep Oxygen Prescription None None      Home Exercise Oxygen Prescription None None      Home Resting Oxygen Prescription None None        Goals/Expected Outcomes   Short Term Goals To learn and exhibit compliance with exercise, home and travel O2 prescription;To learn and understand importance of maintaining oxygen saturations>88%;To learn and demonstrate proper use of respiratory medications;To learn and understand importance of monitoring SPO2 with pulse oximeter and demonstrate accurate use of the pulse oximeter.;To learn and demonstrate proper pursed lip breathing techniques or other breathing techniques.  To learn and exhibit compliance with  exercise, home and travel O2 prescription;To learn and understand importance of maintaining oxygen saturations>88%;To learn and demonstrate proper use of respiratory medications;To learn and understand importance of monitoring SPO2 with pulse oximeter and demonstrate accurate use of the pulse oximeter.;To learn and demonstrate proper pursed lip breathing techniques or other breathing techniques.       Long  Term Goals Exhibits compliance with exercise, home  and travel O2 prescription;Verbalizes importance of monitoring SPO2 with pulse oximeter and return demonstration;Maintenance of O2 saturations>88%;Exhibits proper breathing techniques, such as pursed lip breathing or other method taught during program session;Compliance with respiratory medication;Demonstrates proper use of MDI's Exhibits compliance with exercise, home  and travel O2 prescription;Verbalizes importance of monitoring SPO2 with pulse oximeter and return demonstration;Maintenance of O2 saturations>88%;Exhibits proper breathing techniques, such as pursed lip breathing or other method taught during program session;Compliance with respiratory medication;Demonstrates proper use of MDI's      Goals/Expected Outcomes Compliance and understanding of oxygen saturation monitoring and breathing techniques to decrease shortness of breath. Compliance and understanding of oxygen saturation monitoring and breathing techniques to decrease shortness of breath.               Oxygen Discharge (Final Oxygen Re-Evaluation):  Oxygen Re-Evaluation - 12/12/23 1001       Program Oxygen Prescription   Program Oxygen Prescription Continuous    Liters per minute 1      Home Oxygen   Home Oxygen Device None    Sleep Oxygen Prescription None    Home Exercise Oxygen Prescription None    Home Resting Oxygen Prescription None      Goals/Expected Outcomes   Short Term Goals To learn and exhibit compliance with exercise, home and travel O2 prescription;To  learn and understand importance of maintaining oxygen saturations>88%;To learn and demonstrate proper use of respiratory medications;To learn and understand importance of monitoring SPO2 with pulse oximeter and demonstrate accurate use of the pulse oximeter.;To learn and demonstrate proper pursed lip breathing techniques or other breathing techniques.     Long  Term Goals Exhibits compliance with exercise, home  and travel O2 prescription;Verbalizes importance of monitoring SPO2 with pulse oximeter and return demonstration;Maintenance of O2 saturations>88%;Exhibits proper breathing techniques, such as pursed lip breathing or other method taught during program session;Compliance with respiratory medication;Demonstrates proper use of MDI's    Goals/Expected Outcomes Compliance and understanding of oxygen saturation monitoring and breathing techniques to decrease shortness of breath.  Initial Exercise Prescription:  Initial Exercise Prescription - 11/05/23 1100       Date of Initial Exercise RX and Referring Provider   Date 11/05/23    Referring Provider Harless Lien   Expected Discharge Date 01/31/24      Oxygen   Oxygen Continuous    Liters 1    Maintain Oxygen Saturation 88% or higher      Treadmill   MPH 1.6    Grade 0    Minutes 15      NuStep   Level 1    SPM 60    Minutes 15      Prescription Details   Frequency (times per week) 2    Duration Progress to 30 minutes of continuous aerobic without signs/symptoms of physical distress      Intensity   THRR 40-80% of Max Heartrate 56-111    Ratings of Perceived Exertion 11-13    Perceived Dyspnea 0-4      Progression   Progression Continue to progress workloads to maintain intensity without signs/symptoms of physical distress.      Resistance Training   Training Prescription Yes    Weight blue bands    Reps 10-15             Perform Capillary Blood Glucose checks as needed.  Exercise  Prescription Changes:   Exercise Prescription Changes     Row Name 12/04/23 1200 12/18/23 1200           Response to Exercise   Blood Pressure (Admit) 108/62 124/70      Blood Pressure (Exercise) 150/82 146/70      Blood Pressure (Exit) 112/64 130/60      Heart Rate (Admit) 68 bpm 77 bpm      Heart Rate (Exercise) 109 bpm 107 bpm      Heart Rate (Exit) 89 bpm 79 bpm      Oxygen Saturation (Admit) 98 %  2L 96 %      Oxygen Saturation (Exercise) 91 %  2L 92 %      Oxygen Saturation (Exit) 93 %  RA 93 %      Rating of Perceived Exertion (Exercise) 13 12      Perceived Dyspnea (Exercise) 2 2      Duration Continue with 30 min of aerobic exercise without signs/symptoms of physical distress. Continue with 30 min of aerobic exercise without signs/symptoms of physical distress.      Intensity THRR unchanged THRR unchanged        Progression   Progression Continue to progress workloads to maintain intensity without signs/symptoms of physical distress. Continue to progress workloads to maintain intensity without signs/symptoms of physical distress.        Resistance Training   Training Prescription Yes Yes      Weight blue bands blue bands      Reps 10-15 10-15      Time 10 Minutes 10 Minutes        Oxygen   Oxygen Continuous Continuous      Liters 0-2 2        Treadmill   MPH 1.5 1.5      Grade 0 0      Minutes 15 15      METs 2.15 2        NuStep   Level 2 3      SPM 75 76      Minutes 15 15      METs 2  2.3        Oxygen   Maintain Oxygen Saturation 88% or higher 88% or higher               Exercise Comments:   Exercise Comments     Row Name 11/22/23 1144           Exercise Comments Pt completed first day of exercise. Pt walked on treadmill for 15 min, 1.3 mph, 0% incline, METs 1.9. He then exercised on the recumbent stepper for 15 min, level 1, METs 1.7. Tolerated well. Performed warm up and cool down with verbal cues, including squats. Discussed METs with  good reception.                Exercise Goals and Review:   Exercise Goals     Row Name 11/05/23 0924             Exercise Goals   Increase Physical Activity Yes       Intervention Provide advice, education, support and counseling about physical activity/exercise needs.;Develop an individualized exercise prescription for aerobic and resistive training based on initial evaluation findings, risk stratification, comorbidities and participant's personal goals.       Expected Outcomes Short Term: Attend rehab on a regular basis to increase amount of physical activity.;Long Term: Add in home exercise to make exercise part of routine and to increase amount of physical activity.;Long Term: Exercising regularly at least 3-5 days a week.       Increase Strength and Stamina Yes       Intervention Provide advice, education, support and counseling about physical activity/exercise needs.;Develop an individualized exercise prescription for aerobic and resistive training based on initial evaluation findings, risk stratification, comorbidities and participant's personal goals.       Expected Outcomes Short Term: Increase workloads from initial exercise prescription for resistance, speed, and METs.;Short Term: Perform resistance training exercises routinely during rehab and add in resistance training at home;Long Term: Improve cardiorespiratory fitness, muscular endurance and strength as measured by increased METs and functional capacity ( )       Able to understand and use rate of perceived exertion (RPE) scale Yes       Intervention Provide education and explanation on how to use RPE scale       Expected Outcomes Short Term: Able to use RPE daily in rehab to express subjective intensity level;Long Term:  Able to use RPE to guide intensity level when exercising independently       Able to understand and use Dyspnea scale Yes       Intervention Provide education and explanation on how to use Dyspnea  scale       Expected Outcomes Short Term: Able to use Dyspnea scale daily in rehab to express subjective sense of shortness of breath during exertion;Long Term: Able to use Dyspnea scale to guide intensity level when exercising independently       Knowledge and understanding of Target Heart Rate Range (THRR) Yes       Intervention Provide education and explanation of THRR including how the numbers were predicted and where they are located for reference       Expected Outcomes Short Term: Able to state/look up THRR;Long Term: Able to use THRR to govern intensity when exercising independently;Short Term: Able to use daily as guideline for intensity in rehab       Understanding of Exercise Prescription Yes       Intervention Provide education, explanation, and written materials on patient's  individual exercise prescription       Expected Outcomes Short Term: Able to explain program exercise prescription;Long Term: Able to explain home exercise prescription to exercise independently                Exercise Goals Re-Evaluation :  Exercise Goals Re-Evaluation     Row Name 11/19/23 0934 12/12/23 0945           Exercise Goal Re-Evaluation   Exercise Goals Review Increase Physical Activity;Able to understand and use Dyspnea scale;Understanding of Exercise Prescription;Increase Strength and Stamina;Knowledge and understanding of Target Heart Rate Range (THRR);Able to understand and use rate of perceived exertion (RPE) scale Increase Physical Activity;Able to understand and use Dyspnea scale;Understanding of Exercise Prescription;Increase Strength and Stamina;Knowledge and understanding of Target Heart Rate Range (THRR);Able to understand and use rate of perceived exertion (RPE) scale      Comments Marc Flores was scheduled to begin exercise on 3/13 but called out due to other appointments. Pt will start exercise on 3/18. Will continue to monitor and progress as able. Marc Flores has completed 6 exercise sessions.  He exercises for 15 min on the treadmill and Nustep. Marc Flores averages 2.0 METs at 1.5 mph on the treadmill and 3.0 METs at level 2 on the Nustep. He performs the warmup and cooldown standing without limitations. Marc Flores has increased his speed on the treadmill and level on the Nustep. He is very deconditioned as I am unsure how much progress he will make. Will continue to monitor and progress as able.      Expected Outcomes Through exercise at rehab and home, the patient will decrease shortness of breath with daily activities and feel confident in carrying out an exercise regimen at home. Through exercise at rehab and home, the patient will decrease shortness of breath with daily activities and feel confident in carrying out an exercise regimen at home.               Discharge Exercise Prescription (Final Exercise Prescription Changes):  Exercise Prescription Changes - 12/18/23 1200       Response to Exercise   Blood Pressure (Admit) 124/70    Blood Pressure (Exercise) 146/70    Blood Pressure (Exit) 130/60    Heart Rate (Admit) 77 bpm    Heart Rate (Exercise) 107 bpm    Heart Rate (Exit) 79 bpm    Oxygen Saturation (Admit) 96 %    Oxygen Saturation (Exercise) 92 %    Oxygen Saturation (Exit) 93 %    Rating of Perceived Exertion (Exercise) 12    Perceived Dyspnea (Exercise) 2    Duration Continue with 30 min of aerobic exercise without signs/symptoms of physical distress.    Intensity THRR unchanged      Progression   Progression Continue to progress workloads to maintain intensity without signs/symptoms of physical distress.      Resistance Training   Training Prescription Yes    Weight blue bands    Reps 10-15    Time 10 Minutes      Oxygen   Oxygen Continuous    Liters 2      Treadmill   MPH 1.5    Grade 0    Minutes 15    METs 2      NuStep   Level 3    SPM 76    Minutes 15    METs 2.3      Oxygen   Maintain Oxygen Saturation 88% or higher  Nutrition:  Target Goals: Understanding of nutrition guidelines, daily intake of sodium 1500mg , cholesterol 200mg , calories 30% from fat and 7% or less from saturated fats, daily to have 5 or more servings of fruits and vegetables.  Biometrics:    Nutrition Therapy Plan and Nutrition Goals:  Nutrition Therapy & Goals - 12/13/23 1423       Nutrition Therapy   Diet General healthy diet    Drug/Food Interactions Statins/Certain Fruits      Personal Nutrition Goals   Nutrition Goal Patient to improve diet quality by using the plate method as a guide for meal planning to include lean protein/plant protein, fruits, vegetables, whole grains, nonfat dairy as part of a well-balanced diet.    Comments Goal in progress. Marc Flores has medical history of HTN, COPDIII, bonchiectasis, OSA. Reviewed strategies for weight loss including benefits of high fiber/high protein intake, calorie density, the plate method as a guide for meal planning, etc. He is down 2.4# since starting with our program. Marc Flores will benefit from participation in pulmonary for exericse and nutrition support.      Intervention Plan   Intervention Prescribe, educate and counsel regarding individualized specific dietary modifications aiming towards targeted core components such as weight, hypertension, lipid management, diabetes, heart failure and other comorbidities.;Nutrition handout(s) given to patient.    Expected Outcomes Short Term Goal: Understand basic principles of dietary content, such as calories, fat, sodium, cholesterol and nutrients.;Long Term Goal: Adherence to prescribed nutrition plan.             Nutrition Assessments:  Nutrition Assessments - 11/22/23 1143       Rate Your Plate Scores   Pre Score 50            MEDIFICTS Score Key: >=70 Need to make dietary changes  40-70 Heart Healthy Diet <= 40 Therapeutic Level Cholesterol Diet   Picture Your Plate Scores: <16 Unhealthy dietary pattern with  much room for improvement. 41-50 Dietary pattern unlikely to meet recommendations for good health and room for improvement. 51-60 More healthful dietary pattern, with some room for improvement.  >60 Healthy dietary pattern, although there may be some specific behaviors that could be improved.    Nutrition Goals Re-Evaluation:  Nutrition Goals Re-Evaluation     Row Name 11/27/23 1113 12/13/23 1423           Goals   Current Weight 181 lb 7 oz (82.3 kg) 179 lb 0.2 oz (81.2 kg)      Comment Cr 1.91, GFR 35; other most recent labs from September 2022 lipids WNL, A1c WNL, LDL 81 Cr 1.52, GFR 46; other most recent labs other most recent labs from September 2022 lipids WNL, A1c WNL, LDL 81      Expected Outcome Marc Flores has medical history of HTN, COPDIII, bonchiectasis, OSA. He reports motivation to lose weight; however, he admits recent weight loss is unintentional. Marc Flores is down 5.5# (1.8# per week) since orientation to our program. Discussed eating frequency and protein intake to aid with meeting nutrition needs; will continue to monitor weight. Marc Flores will benefit from participation in pulmonary for exericse and nutrition support. Goal in progress. Marc Flores has medical history of HTN, COPDIII, bonchiectasis, OSA. Reviewed strategies for weight loss including benefits of high fiber/high protein intake, calorie density, the plate method as a guide for meal planning, etc. He is down 2.4# since starting with our program. Marc Flores will benefit from participation in pulmonary for exericse and nutrition support.  Nutrition Goals Discharge (Final Nutrition Goals Re-Evaluation):  Nutrition Goals Re-Evaluation - 12/13/23 1423       Goals   Current Weight 179 lb 0.2 oz (81.2 kg)    Comment Cr 1.52, GFR 46; other most recent labs other most recent labs from September 2022 lipids WNL, A1c WNL, LDL 81    Expected Outcome Goal in progress. Marc Flores has medical history of HTN, COPDIII, bonchiectasis,  OSA. Reviewed strategies for weight loss including benefits of high fiber/high protein intake, calorie density, the plate method as a guide for meal planning, etc. He is down 2.4# since starting with our program. Marc Flores will benefit from participation in pulmonary for exericse and nutrition support.             Psychosocial: Target Goals: Acknowledge presence or absence of significant depression and/or stress, maximize coping skills, provide positive support system. Participant is able to verbalize types and ability to use techniques and skills needed for reducing stress and depression.  Initial Review & Psychosocial Screening:  Initial Psych Review & Screening - 11/05/23 0927       Initial Review   Current issues with None Identified      Family Dynamics   Good Support System? Yes    Comments spouse      Barriers   Psychosocial barriers to participate in program There are no identifiable barriers or psychosocial needs.      Screening Interventions   Interventions Encouraged to exercise             Quality of Life Scores:  Scores of 19 and below usually indicate a poorer quality of life in these areas.  A difference of  2-3 points is a clinically meaningful difference.  A difference of 2-3 points in the total score of the Quality of Life Index has been associated with significant improvement in overall quality of life, self-image, physical symptoms, and general health in studies assessing change in quality of life.  PHQ-9: Review Flowsheet  More data may exist      11/05/2023 12/21/2022 12/19/2021 05/12/2021 12/16/2020  Depression screen PHQ 2/9  Decreased Interest 0 0 0 0 0 0  Down, Depressed, Hopeless 0 0 0 0 0 0  PHQ - 2 Score 0 0 0 0 0 0  Altered sleeping 0 - - 0 -  Tired, decreased energy 0 - - 0 -  Change in appetite 0 - - 0 -  Feeling bad or failure about yourself  0 - - 0 -  Trouble concentrating 0 - - 0 -  Moving slowly or fidgety/restless 0 - - 0 -  Suicidal  thoughts 0 - - 0 -  PHQ-9 Score 0 - - 0 -  Difficult doing work/chores Not difficult at all - - Not difficult at all -    Details       Multiple values from one day are sorted in reverse-chronological order        Interpretation of Total Score  Total Score Depression Severity:  1-4 = Minimal depression, 5-9 = Mild depression, 10-14 = Moderate depression, 15-19 = Moderately severe depression, 20-27 = Severe depression   Psychosocial Evaluation and Intervention:  Psychosocial Evaluation - 11/05/23 0928       Psychosocial Evaluation & Interventions   Interventions Encouraged to exercise with the program and follow exercise prescription    Comments Marc Flores denies any psychosocial barriers at this time.    Expected Outcomes For Marc Flores to participate in rehab free of psychosocial barriers.  Psychosocial Re-Evaluation:  Psychosocial Re-Evaluation     Row Name 11/14/23 1254 12/10/23 0931           Psychosocial Re-Evaluation   Current issues with None Identified None Identified      Comments Marc Flores has not attended any sessions so far. No new psychosocial barriers or concerns since orientation. Marc Flores reports no new psychosocial barriers or concerns at this time.      Expected Outcomes For Marc Flores to attend PR free of any psychosocial barriers or concerns For Marc Flores to attend PR free of any psychosocial barriers or concerns      Interventions Encouraged to attend Pulmonary Rehabilitation for the exercise Encouraged to attend Pulmonary Rehabilitation for the exercise      Continue Psychosocial Services  No Follow up required No Follow up required               Psychosocial Discharge (Final Psychosocial Re-Evaluation):  Psychosocial Re-Evaluation - 12/10/23 0931       Psychosocial Re-Evaluation   Current issues with None Identified    Comments Marc Flores reports no new psychosocial barriers or concerns at this time.    Expected Outcomes For Marc Flores to attend PR free of any  psychosocial barriers or concerns    Interventions Encouraged to attend Pulmonary Rehabilitation for the exercise    Continue Psychosocial Services  No Follow up required             Education: Education Goals: Education classes will be provided on a weekly basis, covering required topics. Participant will state understanding/return demonstration of topics presented.  Learning Barriers/Preferences:  Learning Barriers/Preferences - 11/05/23 1610       Learning Barriers/Preferences   Learning Barriers Sight    Learning Preferences None             Education Topics: Know Your Numbers Group instruction that is supported by a PowerPoint presentation. Instructor discusses importance of knowing and understanding resting, exercise, and post-exercise oxygen saturation, heart rate, and blood pressure. Oxygen saturation, heart rate, blood pressure, rating of perceived exertion, and dyspnea are reviewed along with a normal range for these values.  Flowsheet Row PULMONARY REHAB CHRONIC OBSTRUCTIVE PULMONARY DISEASE from 12/06/2023 in [facility] for Heart, Vascular, & Lung Health  Date 12/06/23  Educator EP  Instruction Review Code 1- Verbalizes Understanding       Exercise for the Pulmonary Patient Group instruction that is supported by a PowerPoint presentation. Instructor discusses benefits of exercise, core components of exercise, frequency, duration, and intensity of an exercise routine, importance of utilizing pulse oximetry during exercise, safety while exercising, and options of places to exercise outside of rehab.  Flowsheet Row PULMONARY REHAB CHRONIC OBSTRUCTIVE PULMONARY DISEASE from 11/29/2023 in [facility] for Heart, Vascular, & Lung Health  Date 11/29/23  Educator EP  Instruction Review Code 1- Verbalizes Understanding       MET Level  Group instruction provided by PowerPoint, verbal discussion, and written material to  support subject matter. Instructor reviews what METs are and how to increase METs.    Pulmonary Medications Verbally interactive group education provided by instructor with focus on inhaled medications and proper administration. Flowsheet Row PULMONARY REHAB CHRONIC OBSTRUCTIVE PULMONARY DISEASE from 11/22/2023 in [facility] for Heart, Vascular, & Lung Health  Date 11/22/23  Educator RT  Instruction Review Code 1- Verbalizes Understanding       Anatomy and Physiology of the Respiratory System Group instruction provided by PowerPoint, verbal  discussion, and written material to support subject matter. Instructor reviews respiratory cycle and anatomical components of the respiratory system and their functions. Instructor also reviews differences in obstructive and restrictive respiratory diseases with examples of each.    Oxygen Safety Group instruction provided by PowerPoint, verbal discussion, and written material to support subject matter. There is an overview of "What is Oxygen" and "Why do we need it".  Instructor also reviews how to create a safe environment for oxygen use, the importance of using oxygen as prescribed, and the risks of noncompliance. There is a brief discussion on traveling with oxygen and resources the patient may utilize. Flowsheet Row PULMONARY REHAB CHRONIC OBSTRUCTIVE PULMONARY DISEASE from 12/13/2023 in [facility] for Heart, Vascular, & Lung Health  Date 12/13/23  Educator RN  Instruction Review Code 1- Verbalizes Understanding       Oxygen Use Group instruction provided by PowerPoint, verbal discussion, and written material to discuss how supplemental oxygen is prescribed and different types of oxygen supply systems. Resources for more information are provided.    Breathing Techniques Group instruction that is supported by demonstration and informational handouts. Instructor discusses the benefits of pursed lip  and diaphragmatic breathing and detailed demonstration on how to perform both.     Risk Factor Reduction Group instruction that is supported by a PowerPoint presentation. Instructor discusses the definition of a risk factor, different risk factors for pulmonary disease, and how the heart and lungs work together.   Pulmonary Diseases Group instruction provided by PowerPoint, verbal discussion, and written material to support subject matter. Instructor gives an overview of the different type of pulmonary diseases. There is also a discussion on risk factors and symptoms as well as ways to manage the diseases.   Stress and Energy Conservation Group instruction provided by PowerPoint, verbal discussion, and written material to support subject matter. Instructor gives an overview of stress and the impact it can have on the body. Instructor also reviews ways to reduce stress. There is also a discussion on energy conservation and ways to conserve energy throughout the day.   Warning Signs and Symptoms Group instruction provided by PowerPoint, verbal discussion, and written material to support subject matter. Instructor reviews warning signs and symptoms of stroke, heart attack, cold and flu. Instructor also reviews ways to prevent the spread of infection.   Other Education Group or individual verbal, written, or video instructions that support the educational goals of the pulmonary rehab program.    Knowledge Questionnaire Score:  Knowledge Questionnaire Score - 11/05/23 1103       Knowledge Questionnaire Score   Pre Score 17/18             Core Components/Risk Factors/Patient Goals at Admission:  Personal Goals and Risk Factors at Admission - 11/05/23 0924       Core Components/Risk Factors/Patient Goals on Admission    Weight Management Weight Loss    Expected Outcomes Short Term: Continue to assess and modify interventions until short term weight is achieved;Long Term: Adherence  to nutrition and physical activity/exercise program aimed toward attainment of established weight goal;Weight Maintenance: Understanding of the daily nutrition guidelines, which includes 25-35% calories from fat, 7% or less cal from saturated fats, less than 200mg  cholesterol, less than 1.5gm of sodium, & 5 or more servings of fruits and vegetables daily;Understanding recommendations for meals to include 15-35% energy as protein, 25-35% energy from fat, 35-60% energy from carbohydrates, less than 200mg  of dietary cholesterol, 20-35 gm of total  fiber daily;Weight Loss: Understanding of general recommendations for a balanced deficit meal plan, which promotes 1-2 lb weight loss per week and includes a negative energy balance of [phone] kcal/d;Understanding of distribution of calorie intake throughout the day with the consumption of 4-5 meals/snacks    Improve shortness of breath with ADL's Yes    Intervention Provide education, individualized exercise plan and daily activity instruction to help decrease symptoms of SOB with activities of daily living.    Expected Outcomes Short Term: Improve cardiorespiratory fitness to achieve a reduction of symptoms when performing ADLs;Long Term: Be able to perform more ADLs without symptoms or delay the onset of symptoms             Core Components/Risk Factors/Patient Goals Review:   Goals and Risk Factor Review     Row Name 11/14/23 1302 12/10/23 0933           Core Components/Risk Factors/Patient Goals Review   Personal Goals Review Weight Management/Obesity;Improve shortness of breath with ADL's;Develop more efficient breathing techniques such as purse lipped breathing and diaphragmatic breathing and practicing self-pacing with activity. Weight Management/Obesity;Improve shortness of breath with ADL's;Develop more efficient breathing techniques such as purse lipped breathing and diaphragmatic breathing and practicing self-pacing with activity.      Review  Marc Flores has not started PR class yet. Unable to assess goals at this time. Goal progressing for weight loss. Goal progressing for improving shortness of breath with ADL's. Marc Flores is currently requiring 2L of O2 while exercising. He is currently exercising on the Treadmill and the Nustep. Goal progressing for developing more efficient breathing techniques such as purse lipped breathing and diaphragmatic breathing; and practicing self-pacing with activity.      Expected Outcomes To improve shortness of breath with ADL's, develop more efficient breathing techniques such as purse lipped breathing and diaphragmatic breathing; and practicing self-pacing with activity and lose weight. To improve shortness of breath with ADL's, develop more efficient breathing techniques such as purse lipped breathing and diaphragmatic breathing; and practicing self-pacing with activity and lose weight.               Core Components/Risk Factors/Patient Goals at Discharge (Final Review):   Goals and Risk Factor Review - 12/10/23 0933       Core Components/Risk Factors/Patient Goals Review   Personal Goals Review Weight Management/Obesity;Improve shortness of breath with ADL's;Develop more efficient breathing techniques such as purse lipped breathing and diaphragmatic breathing and practicing self-pacing with activity.    Review Goal progressing for weight loss. Goal progressing for improving shortness of breath with ADL's. Marc Flores is currently requiring 2L of O2 while exercising. He is currently exercising on the Treadmill and the Nustep. Goal progressing for developing more efficient breathing techniques such as purse lipped breathing and diaphragmatic breathing; and practicing self-pacing with activity.    Expected Outcomes To improve shortness of breath with ADL's, develop more efficient breathing techniques such as purse lipped breathing and diaphragmatic breathing; and practicing self-pacing with activity and lose weight.              ITP Comments: Pt is making expected progress toward Pulmonary Rehab goals after completing 8 session(s). Recommend continued exercise, life style modification, education, and utilization of breathing techniques to increase stamina and strength, while also decreasing shortness of breath with exertion.  Dr. Mechele Collin is Medical Director for Pulmonary Rehab at [facility].

## 2023-12-20 ENCOUNTER — Encounter (HOSPITAL_COMMUNITY)
Admission: RE | Admit: 2023-12-20 | Discharge: 2023-12-20 | Disposition: A | Discharge status: Home / Self Care | Payer: PPO | Source: Ambulatory Visit | Attending: Pulmonary Disease | Admitting: Pulmonary Disease

## 2023-12-20 DIAGNOSIS — J449 Chronic obstructive pulmonary disease, unspecified: Secondary | ICD-10-CM | POA: Diagnosis not present

## 2023-12-20 NOTE — Progress Notes (Signed)
 Daily Session Note  Patient Details  Name: Marc Flores MRN: [id number] Date of Birth: [birth year] Referring Provider:   [name] Pulmonary Rehab Walk Test from 11/05/2023 in [facility] for Heart, Vascular, & Lung Health  Referring Provider [name]  [[name]]       Encounter Date: 12/20/2023  Check In:  Session Check In - 12/20/23 1027       Check-In   Supervising physician immediately available to respond to emergencies [facility] MD immediately available    Physician(s) Charles Connor, NP    Location [facility]    Staff Present Atlas Lea, MS, ACSM-CEP, Exercise Physiologist;Mary Arlester Ladd, RN, BSN;Randi Rochelle Chu, ACSM-CEP, Exercise Physiologist;Casey Burnetta Cart, MS, Exercise Physiologist;Johnny Alexia Angelucci, MS, Exercise Physiologist    Virtual Visit No    Medication changes reported     No    Fall or balance concerns reported    No    Tobacco Cessation No Change    Warm-up and Cool-down Performed as group-led instruction    Resistance Training Performed Yes    VAD Patient? No    PAD/SET Patient? No      Pain Assessment   Currently in Pain? No/denies    Pain Score 0-No pain    Multiple Pain Sites No             Capillary Blood Glucose: No results found for this or any previous visit (from the past 24 hours).    Social History   Tobacco Use  Smoking Status Former   Current packs/day: 0.00   Average packs/day: 1 pack/day for 30.0 years (30.0 ttl pk-yrs)   Types: Cigarettes   Start date: 12/07/1961   Quit date: 12/08/1991   Years since quitting: 32.0   Passive exposure: Past  Smokeless Tobacco Never    Goals Met:  Proper associated with RPD/PD & O2 Sat Independence with exercise equipment Exercise tolerated well No report of concerns or symptoms today Strength training completed today  Goals Unmet:  Not Applicable  Comments: Service time is from 1007 to 1136.    Dr. Genetta Kenning is Medical Director for  Pulmonary Rehab at [facility].

## 2023-12-25 ENCOUNTER — Encounter (HOSPITAL_COMMUNITY)
Admission: RE | Admit: 2023-12-25 | Discharge: 2023-12-25 | Disposition: A | Discharge status: Home / Self Care | Payer: PPO | Source: Ambulatory Visit | Attending: Pulmonary Disease | Admitting: Pulmonary Disease

## 2023-12-25 DIAGNOSIS — J449 Chronic obstructive pulmonary disease, unspecified: Secondary | ICD-10-CM

## 2023-12-25 NOTE — Progress Notes (Signed)
 Daily Session Note  Patient Details  Name: Marc Flores MRN: [id number] Date of Birth: Jan 10, 1941 Referring Provider:   [name] Pulmonary Rehab Walk Test from 11/05/2023 in [facility] for Heart, Vascular, & Lung Health  Referring Provider [name]  [[name]]       Encounter Date: 12/25/2023  Check In:  Session Check In - 12/25/23 1025       Check-In   Supervising physician immediately available to respond to emergencies [facility] MD immediately available    Physician(s) Marlana Silvan, NP    Location [facility]    Staff Present Atlas Lea, MS, ACSM-CEP, Exercise Physiologist;Randi Rochelle Chu, ACSM-CEP, Exercise Physiologist;Casey Vernadine Golas Belarus, RD, LDN    Virtual Visit No    Medication changes reported     No    Fall or balance concerns reported    No    Tobacco Cessation No Change    Warm-up and Cool-down Performed as group-led instruction    Resistance Training Performed Yes    VAD Patient? No    PAD/SET Patient? No      Pain Assessment   Currently in Pain? No/denies    Multiple Pain Sites No             Capillary Blood Glucose: No results found for this or any previous visit (from the past 24 hours).    Social History   Tobacco Use  Smoking Status Former   Current packs/day: 0.00   Average packs/day: 1 pack/day for 30.0 years (30.0 ttl pk-yrs)   Types: Cigarettes   Start date: 12/07/1961   Quit date: 12/08/1991   Years since quitting: 32.0   Passive exposure: Past  Smokeless Tobacco Never    Goals Met:  Proper associated with RPD/PD & O2 Sat Exercise tolerated well No report of concerns or symptoms today Strength training completed today  Goals Unmet:  Not Applicable  Comments: Service time is from 1011 to 1131.    Dr. Genetta Kenning is Medical Director for Pulmonary Rehab at [facility].

## 2023-12-27 ENCOUNTER — Encounter (HOSPITAL_COMMUNITY)
Admission: RE | Admit: 2023-12-27 | Discharge: 2023-12-27 | Disposition: A | Discharge status: Home / Self Care | Payer: PPO | Source: Ambulatory Visit | Attending: Pulmonary Disease

## 2023-12-27 DIAGNOSIS — J449 Chronic obstructive pulmonary disease, unspecified: Secondary | ICD-10-CM | POA: Diagnosis not present

## 2023-12-27 NOTE — Progress Notes (Signed)
 Daily Session Note  Patient Details  Name: Marc Flores MRN: [id number] Date of Birth: 1940-10-15 Referring Provider:   [name] Pulmonary Rehab Walk Test from 11/05/2023 in [facility] for Heart, Vascular, & Lung Health  Referring Provider [name]  [[name]]       Encounter Date: 12/27/2023  Check In:  Session Check In - 12/27/23 1027       Check-In   Supervising physician immediately available to respond to emergencies [facility] MD immediately available    Physician(s) Lawana Pray, NP    Location [facility]    Staff Present Atlas Lea, MS, ACSM-CEP, Exercise Physiologist;Randi Rochelle Chu, ACSM-CEP, Exercise Physiologist;Casey Carmen Chol, RN, BSN    Virtual Visit No    Medication changes reported     No    Fall or balance concerns reported    No    Tobacco Cessation No Change    Warm-up and Cool-down Performed as group-led instruction    Resistance Training Performed Yes    VAD Patient? No    PAD/SET Patient? No      Pain Assessment   Currently in Pain? No/denies    Multiple Pain Sites No             Capillary Blood Glucose: No results found for this or any previous visit (from the past 24 hours).    Social History   Tobacco Use  Smoking Status Former   Current packs/day: 0.00   Average packs/day: 1 pack/day for 30.0 years (30.0 ttl pk-yrs)   Types: Cigarettes   Start date: 12/07/1961   Quit date: 12/08/1991   Years since quitting: 32.0   Passive exposure: Past  Smokeless Tobacco Never    Goals Met:  Independence with exercise equipment Exercise tolerated well No report of concerns or symptoms today Strength training completed today  Goals Unmet:  Not Applicable  Comments: Service time is from 1010 to 1140    Dr. Genetta Kenning is Medical Director for Pulmonary Rehab at [facility].

## 2024-01-01 ENCOUNTER — Encounter (HOSPITAL_COMMUNITY)
Admission: RE | Admit: 2024-01-01 | Discharge: 2024-01-01 | Disposition: A | Discharge status: Home / Self Care | Payer: PPO | Source: Ambulatory Visit | Attending: Pulmonary Disease

## 2024-01-01 VITALS — Wt 176.8 lb

## 2024-01-01 DIAGNOSIS — J449 Chronic obstructive pulmonary disease, unspecified: Secondary | ICD-10-CM | POA: Diagnosis not present

## 2024-01-01 NOTE — Progress Notes (Signed)
 Daily Session Note  Patient Details  Name: Marc Flores MRN: [id number] Date of Birth: 09-04-1941 Referring Provider:   [name] Pulmonary Rehab Walk Test from 11/05/2023 in [facility] for Heart, Vascular, & Lung Health  Referring Provider [name]  [[name]]       Encounter Date: 01/01/2024  Check In:  Session Check In - 01/01/24 1045       Check-In   Supervising physician immediately available to respond to emergencies [facility] MD immediately available    Physician(s) Lawana Pray, NP    Location [facility]    Staff Present Atlas Lea, MS, ACSM-CEP, Exercise Physiologist;Ameia Morency Rochelle Chu, ACSM-CEP, Exercise Physiologist;Casey Carmen Chol, RN, BSN    Virtual Visit No    Medication changes reported     No    Fall or balance concerns reported    No    Tobacco Cessation No Change    Warm-up and Cool-down Performed as group-led instruction    Resistance Training Performed Yes    VAD Patient? No    PAD/SET Patient? No      Pain Assessment   Currently in Pain? No/denies    Multiple Pain Sites No             Capillary Blood Glucose: No results found for this or any previous visit (from the past 24 hours).   Exercise Prescription Changes - 01/01/24 1200       Response to Exercise   Blood Pressure (Admit) 122/70    Blood Pressure (Exercise) 152/68    Blood Pressure (Exit) 120/68    Heart Rate (Admit) 67 bpm    Heart Rate (Exercise) 110 bpm    Heart Rate (Exit) 77 bpm    Oxygen  Saturation (Admit) 92 %    Oxygen  Saturation (Exercise) 92 %    Oxygen  Saturation (Exit) 94 %    Rating of Perceived Exertion (Exercise) 14    Perceived Dyspnea (Exercise) 2    Duration Continue with 30 min of aerobic exercise without signs/symptoms of physical distress.    Intensity THRR unchanged      Progression   Progression Continue to progress workloads to maintain intensity without signs/symptoms of physical distress.       Resistance Training   Training Prescription Yes    Weight blue bands    Reps 10-15    Time 10 Minutes      Oxygen    Oxygen  Continuous    Liters 2      Treadmill   MPH 1.6    Grade 1    Minutes 15    METs 2.3      NuStep   Level 4    SPM 76    Minutes 15    METs 2.5             Social History   Tobacco Use  Smoking Status Former   Current packs/day: 0.00   Average packs/day: 1 pack/day for 30.0 years (30.0 ttl pk-yrs)   Types: Cigarettes   Start date: 12/07/1961   Quit date: 12/08/1991   Years since quitting: 32.0   Passive exposure: Past  Smokeless Tobacco Never    Goals Met:  Independence with exercise equipment Exercise tolerated well No report of concerns or symptoms today Strength training completed today  Goals Unmet:  Not Applicable  Comments: Service time is from 1009 to 1131.    Dr. Genetta Kenning is Medical Director for Pulmonary Rehab at [facility].

## 2024-01-02 ENCOUNTER — Ambulatory Visit: Admitting: Family Medicine

## 2024-01-02 ENCOUNTER — Encounter: Payer: Self-pay | Admitting: Family Medicine

## 2024-01-02 ENCOUNTER — Ambulatory Visit: Payer: Self-pay

## 2024-01-02 VITALS — BP 140/78 | HR 94 | Temp 98.1°F | Ht 66.0 in | Wt 176.8 lb

## 2024-01-02 DIAGNOSIS — L299 Pruritus, unspecified: Secondary | ICD-10-CM

## 2024-01-02 DIAGNOSIS — S40261A Insect bite (nonvenomous) of right shoulder, initial encounter: Secondary | ICD-10-CM

## 2024-01-02 DIAGNOSIS — W57XXXA Bitten or stung by nonvenomous insect and other nonvenomous arthropods, initial encounter: Secondary | ICD-10-CM | POA: Diagnosis not present

## 2024-01-02 NOTE — Telephone Encounter (Signed)
 Chief Complaint: Tick bite Symptoms: Redness around bite area, believes a piece of the leg is stuck in the shoulder, right shoulder itching  Frequency: Onset 2 days ago Pertinent Negatives: Patient denies fever, SOB, rash and headache Disposition: [] ED /[] Urgent Care (no appt availability in office) / [x] Appointment(In office/virtual)/ []  [place] [facility]/ [] Home Care/ [] Refused Recommended Disposition /[] [place] [facility]/ []  Follow-up with PCP Additional Notes: Patient's wife states she removed a tick from the patient's right shoulder 2 days ago. Patient's wife states the patient is complaining of itching and redness around the tick bite area. Care advice was given and patient has been scheduled for an appointment today.  Copied from CRM [id number]. Topic: Clinical - Red Word Triage >> Jan 02, 2024 11:08 AM Juluis Ok wrote: [facility] that prompted transfer to Nurse Triage: Tick bite w/ redness, drainage - RT shoulder Reason for Disposition  [1] 2 to 14 days following tick bite AND [2] fever AND [3] no rash or headache  Answer Assessment - Initial Assessment Questions 1. ATTACHED:  "Is the tick still on the skin?"  (e.g., yes, no, unsure)     No  2. ONSET - TICK STILL ATTACHED:  "How long do you think the tick has been on your skin?" (e.g., hours, days, unsure)  Note:  Is there a recent activity (camping, hiking) where the caller may have been exposed?     N/A 3. ONSET - TICK NOT STILL ATTACHED: "If the tick has been removed, how long do you think the tick was attached before you removed it?" (e.g., 5 hours, 2 days). "When was this?"     2 days ago  4. LOCATION: "Where is the tick bite located?" (e.g., arm, leg)     Right shoulder  5. TYPE of TICK: "Is it a wood tick or a deer tick?" (e.g., deer tick, wood tick; unsure)     Unsure  6. SIZE of TICK: "How big is the tick?" (e.g., size of poppy seed, apple seed, watermelon seed; unsure) Note: Deer ticks can be the size of a poppy seed  (nymph) or an apple seed (adult).       Watermelon seed 7. ENGORGED: "Did the tick look flat or engorged (full, swollen)?" (e.g., flat, engorged; unsure)     Unsure  8. OTHER SYMPTOMS: "Do you have any other symptoms?" (e.g., fever, rash, redness at bite area, red ring around bite)     Redness at bite area, lump, itching  9. PREGNANCY: "Is there any chance you are pregnant?" "When was your last menstrual period?"     N/A  Protocols used: Tick Bite-A-AH

## 2024-01-02 NOTE — Progress Notes (Signed)
 Established Patient Office Visit   Subjective  Patient ID: Marc Flores, male    DOB: 1940/12/09  Age: 83 y.o. MRN: [id number]  Chief Complaint  Patient presents with   Tick Removal    Tick bite right shoulder, started a week ago     Patient is an 83 year old male followed by Dr. Alyne Babinski and seen for acute concern.  Patient endorses tick bite 8 days ago.  States area on right posterior shoulder pruritic.  Patient's wife removed a tick from his shoulder 3-4 days ago.  But was unsure if all was removed.  Patient feels a hard area in the center of the bite.  Patient denies fever, chills, headaches, nausea, vomiting, rash.  Patient given a 7-day course of doxycycline  on 12/14/23 for COPD exacerbation.    Patient Active Problem List   Diagnosis Date Noted   Primary hypertension 11/16/2023   Benign prostatic hyperplasia without urinary obstruction 09/23/2023   GERD (gastroesophageal reflux disease) 09/23/2023   COPD exacerbation (HCC) 09/23/2023   Acute respiratory failure with hypoxia (HCC) 09/23/2023   OSA (obstructive sleep apnea) 07/11/2023   Exercise hypoxemia 11/21/2022   Gout 05/11/2020   Dyslipidemia 05/11/2020   Bronchiectasis without acute exacerbation (HCC) 12/29/2016   DOE (dyspnea on exertion) 12/08/2011   COPD GOLD III  12/08/2011   Past Medical History:  Diagnosis Date   Arthritis    Bruises easily    Cancer (HCC)    BLADDER, sees Dr. Joie Narrow   COPD (chronic obstructive pulmonary disease) (HCC)    sees Dr. Waymond Hailey     Diverticulitis    Dyspnea    ON EXERTION   History of chickenpox    Hyperlipidemia    Past Surgical History:  Procedure Laterality Date   COLONOSCOPY  04/13/2020   per Dr. 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 N/A 12/18/2019   Procedure: TRANSURETHRAL RESECTION OF BLADDER TUMOR, RIGHT URETEROSCOPY AND RIGHT URETERAL STENT PLACEMENT;  Surgeon: Trent Frizzle, MD;  Location: [facility];  Service: Urology;  Laterality: N/A;   WISDOM TOOTH EXTRACTION  AS CHILD   Social History   Tobacco Use   Smoking status: Former    Current packs/day: 0.00    Average packs/day: 1 pack/day for 30.0 years (30.0 ttl pk-yrs)    Types: Cigarettes    Start date: 12/07/1961    Quit date: 12/08/1991    Years since quitting: 32.0    Passive exposure: Past   Smokeless tobacco: Never  Vaping Use   Vaping status: Never Used  Substance Use Topics   Alcohol use: Yes   Drug use: No   Family History  Problem Relation Age of Onset   Glaucoma Mother    Diabetes Mother    Hearing loss Mother    Heart disease Mother    Heart disease Father    Atrial fibrillation Father    Arthritis Father    Cancer Father    Hearing loss Father    Hypertension Father    No Known Allergies    ROS Negative unless stated above    Objective:     BP (!) 140/78 (BP Location: Left  Arm, Patient Position: Sitting, Cuff Size: Normal)   Pulse 94   Temp 98.1 F (36.7 C) (Oral)   Ht 5\' 6"  (1.676 m)   Wt 176 lb 12.8 oz (80.2 kg)   SpO2 (!) 86% Comment: patient is on O2 at home  BMI 28.54 kg/m  BP Readings from Last 3 Encounters:  01/02/24 (!) 140/78  12/14/23 137/63  11/16/23 132/68   Wt Readings from Last 3 Encounters:  01/02/24 176 lb 12.8 oz (80.2 kg)  01/01/24 176 lb 12.9 oz (80.2 kg)  12/18/23 178 lb 9.2 oz (81 kg)      Physical Exam Constitutional:      General: He is not in acute distress.    Appearance: Normal appearance.  HENT:     Head: Normocephalic and atraumatic.     Nose: Nose normal.     Mouth/Throat:     Mouth: Mucous membranes are moist.  Cardiovascular:      Rate and Rhythm: Normal rate and regular rhythm.     Heart sounds: Normal heart sounds. No murmur heard.    No gallop.  Pulmonary:     Effort: Pulmonary effort is normal. No respiratory distress.     Breath sounds: Normal breath sounds. No wheezing, rhonchi or rales.  Skin:    General: Skin is warm and dry.          Comments: Posterior right shoulder proximal to neck with a slightly raised bump with central opening.  Black debris removed from area.  No purulence, target rash, increased warmth, induration noted.  Neurological:     Mental Status: He is alert and oriented to person, place, and time.      No results found for any visits on 01/02/24.    Assessment & Plan:  Tick bite of right shoulder, initial encounter  Pruritus  Pt status post tick bite 8 days ago.  Consent obtained.  Persistent debris from tick removed in clinic.  Patient tolerated procedure well.  Discussed course of doxycycline  to prevent tickborne illness.  Patient declines at this time given recent doxycycline  use.  Given strict precautions.  Return if symptoms worsen or fail to improve.   Viola Greulich, MD

## 2024-01-03 ENCOUNTER — Encounter (HOSPITAL_COMMUNITY)
Admission: RE | Admit: 2024-01-03 | Discharge: 2024-01-03 | Disposition: A | Discharge status: Home / Self Care | Payer: PPO | Source: Ambulatory Visit | Attending: Pulmonary Disease | Admitting: Pulmonary Disease

## 2024-01-03 DIAGNOSIS — J449 Chronic obstructive pulmonary disease, unspecified: Secondary | ICD-10-CM | POA: Insufficient documentation

## 2024-01-03 NOTE — Progress Notes (Signed)
 Daily Session Note  Patient Details  Name: Marc Flores MRN: [id number] Date of Birth: [birth year] Referring Provider:   Gattis Flores Pulmonary Rehab Walk Test from 11/05/2023 in [facility], [facility]  Referring Provider Marc Flores  [Marc Flores]       Encounter Date: 01/03/2024  Check In:  Session Check In - 01/03/24 1031       Check-In   Supervising physician immediately available to respond to emergencies [facility] MD immediately available    Physician(s) Koren Persons, NP    Location [facility]    Staff Present Atlas Lea, MS, ACSM-CEP, Exercise Physiologist;Randi Rochelle Chu, ACSM-CEP, Exercise Physiologist;Yoon Barca Felipe Horton, Graig Lawyer, RN, Mercedes Stake, RN, BSN    Virtual Visit No    Medication changes reported     No    Fall or balance concerns reported    No    Tobacco Cessation No Change    Warm-up and Cool-down Performed as group-led Writer Performed Yes    VAD Patient? No    PAD/SET Patient? No      Pain Assessment   Currently in Pain? No/denies    Pain Score 0-No pain    Multiple Pain Sites No             Capillary Blood Glucose: No results found for this or any previous visit (from the past 24 hours).    Social History   Tobacco Use  Smoking Status Former   Current packs/day: 0.00   Average packs/day: 1 pack/day for 30.0 years (30.0 ttl pk-yrs)   Types: Cigarettes   Start date: 12/07/1961   Quit date: 12/08/1991   Years since quitting: 32.0   Passive exposure: Past  Smokeless Tobacco Never    Goals Met:  Proper associated with RPD/PD & O2 Sat Independence with exercise equipment Exercise tolerated well No report of concerns or symptoms today Strength training completed today  Goals Unmet:  Not Applicable  Comments: Service time is from 1000 to 1143.    Dr. Genetta Kenning is Medical Director for Pulmonary Rehab at [facility].

## 2024-01-08 ENCOUNTER — Encounter (HOSPITAL_COMMUNITY)
Admission: RE | Admit: 2024-01-08 | Discharge: 2024-01-08 | Disposition: A | Discharge status: Home / Self Care | Payer: PPO | Source: Ambulatory Visit | Attending: Pulmonary Disease | Admitting: Pulmonary Disease

## 2024-01-08 DIAGNOSIS — J449 Chronic obstructive pulmonary disease, unspecified: Secondary | ICD-10-CM

## 2024-01-08 NOTE — Progress Notes (Signed)
 Daily Session Note  Patient Details  Name: Marc Flores MRN: [id number] Date of Birth: [birth year] Referring Provider:   [name] Pulmonary Rehab Walk Test from 11/05/2023 in [facility] for [facility], Vascular, & Lung Health  Referring Provider [name]  [[name]]       Encounter Date: 01/08/2024  Check In:  Session Check In - 01/08/24 1020       Check-In   Supervising physician immediately available to respond to emergencies [facility] MD immediately available    Physician(s) Slater Duncan, NP    [facility]    Staff Present Atlas Lea, MS, ACSM-CEP, Exercise Physiologist;Yarima Penman Rochelle Chu, ACSM-CEP, Exercise Physiologist;Casey Carmen Chol, RN, BSN;Johnny Porter, MS, Exercise Physiologist    Virtual Visit No    Medication changes reported     No    Fall or balance concerns reported    No    Tobacco Cessation No Change    Warm-up and Cool-down Performed as group-led instruction    Resistance Training Performed Yes    VAD Patient? No    PAD/SET Patient? No      Pain Assessment   Currently in Pain? No/denies    Pain Score 0-No pain    Multiple Pain Sites No             Capillary Blood Glucose: No results found for this or any previous visit (from the past 24 hours).    Social History   Tobacco Use  Smoking Status Former   Current packs/day: 0.00   Average packs/day: 1 pack/day for 30.0 years (30.0 ttl pk-yrs)   Types: Cigarettes   Start date: 12/07/1961   Quit date: 12/08/1991   Years since quitting: 32.1   Passive exposure: Past  Smokeless Tobacco Never    Goals Met:  Independence with exercise equipment Exercise tolerated well No report of concerns or symptoms today Strength training completed today  Goals Unmet:  Not Applicable  Comments: Service time is from 1009 to 1137.    Dr. Genetta Kenning is Medical Director for Pulmonary Rehab at [facility].

## 2024-01-08 NOTE — Telephone Encounter (Signed)
 Pt was seen by Dr Arliss Lam on 01/02/24 for this

## 2024-01-10 ENCOUNTER — Encounter (HOSPITAL_COMMUNITY)
Admission: RE | Admit: 2024-01-10 | Discharge: 2024-01-10 | Disposition: A | Discharge status: Home / Self Care | Payer: PPO | Source: Ambulatory Visit | Attending: Pulmonary Disease | Admitting: Pulmonary Disease

## 2024-01-10 DIAGNOSIS — J449 Chronic obstructive pulmonary disease, unspecified: Secondary | ICD-10-CM

## 2024-01-10 NOTE — Progress Notes (Signed)
 Daily Session Note  Patient Details  Name: Marc Flores MRN: [id number] Date of Birth: 1941-06-23 Referring Provider:   [name] Pulmonary Rehab Walk Test from 11/05/2023 in [facility] for Heart, Vascular, & Lung Health  Referring Provider [name]  [[name]]       Encounter Date: 01/10/2024  Check In:  Session Check In - 01/10/24 1112       Check-In   Supervising physician immediately available to respond to emergencies [facility] MD immediately available    Physician(s) Levin Reamer, NP    [facility]    Staff Present Atlas Lea, MS, ACSM-CEP, Exercise Physiologist;Randi Rochelle Chu, ACSM-CEP, Exercise Physiologist;Kirstein Baxley Felipe Horton, Graig Lawyer, RN, Malvin Searing, MS, ACSM-CEP, CCRP, Exercise Physiologist    Virtual Visit No    Medication changes reported     No    Fall or balance concerns reported    No    Tobacco Cessation No Change    Warm-up and Cool-down Performed as group-led instruction    Resistance Training Performed Yes    VAD Patient? No    PAD/SET Patient? No      Pain Assessment   Currently in Pain? No/denies    Multiple Pain Sites No             Capillary Blood Glucose: No results found for this or any previous visit (from the past 24 hours).    Social History   Tobacco Use  Smoking Status Former   Current packs/day: 0.00   Average packs/day: 1 pack/day for 30.0 years (30.0 ttl pk-yrs)   Types: Cigarettes   Start date: 12/07/1961   Quit date: 12/08/1991   Years since quitting: 32.1   Passive exposure: Past  Smokeless Tobacco Never    Goals Met:  Proper associated with RPD/PD & O2 Sat Independence with exercise equipment Exercise tolerated well No report of concerns or symptoms today Strength training completed today  Goals Unmet:  Not Applicable  Comments: Service time is from 1008 to 1134.    Dr. Genetta Kenning is Medical Director for Pulmonary Rehab at [facility].

## 2024-01-15 ENCOUNTER — Encounter (HOSPITAL_COMMUNITY)
Admission: RE | Admit: 2024-01-15 | Discharge: 2024-01-15 | Disposition: A | Discharge status: Home / Self Care | Payer: PPO | Source: Ambulatory Visit | Attending: Pulmonary Disease

## 2024-01-15 DIAGNOSIS — J449 Chronic obstructive pulmonary disease, unspecified: Secondary | ICD-10-CM | POA: Diagnosis not present

## 2024-01-15 NOTE — Progress Notes (Signed)
 Home Exercise Prescription I have reviewed a Home Exercise Prescription with Marc Flores. Marc Flores is not currently exercising at home. I encouraged him to exercise at the [facility]. I told him he could walk on the treadmill or cycle on the recumbent bike. I recommended exercising 2 days/wk for at least 30 min/day. He agreed with my recommendations. I feel confident in carrying out an exercise regimen at home. I am unsure how much he will progress on his own as Marc Flores has not felt like his shortness of breath has been the same. The patient stated that their goals were to decrease shortness of breath. We reviewed exercise guidelines, target heart rate during exercise, RPE Scale, weather conditions, endpoints for exercise, warmup and cool down. The patient is encouraged to come to me with any questions. I will continue to follow up with the patient to assist them with progression and safety. Spent 15 min with patient discussing home exercise plan and goals  Floretta Huron, MS, ACSM-CEP 01/15/2024 3:39 PM

## 2024-01-15 NOTE — Progress Notes (Signed)
 Daily Session Note  Patient Details  Name: Marc Flores MRN: [id number] Date of Birth: [birth year] Referring Provider:   [name] Pulmonary Rehab Walk Test from 11/05/2023 in [facility] for Heart, Vascular, & Lung Health  Referring Provider [name]  [[name]]       Encounter Date: 01/15/2024  Check In:  Session Check In - 01/15/24 1121       Check-In   Supervising physician immediately available to respond to emergencies [facility] MD immediately available    Physician(s) Levin Reamer, NP    [facility]    Staff Present Atlas Lea, MS, ACSM-CEP, Exercise Physiologist;Randi Rochelle Chu, ACSM-CEP, Exercise Physiologist;Casey Felipe Horton, Graig Lawyer, RN, BSN;Samantha Belarus, RD, Evalyn Hillier, RN, BSN    Virtual Visit No    Medication changes reported     No    Fall or balance concerns reported    No    Tobacco Cessation No Change    Warm-up and Cool-down Performed as group-led Writer Performed Yes    VAD Patient? No    PAD/SET Patient? No      Pain Assessment   Currently in Pain? No/denies             Capillary Blood Glucose: No results found for this or any previous visit (from the past 24 hours).   Exercise Prescription Changes - 01/15/24 1100       Response to Exercise   Blood Pressure (Admit) 144/62    Blood Pressure (Exercise) 150/70    Blood Pressure (Exit) 118/62    Heart Rate (Admit) 91 bpm    Heart Rate (Exercise) 108 bpm    Heart Rate (Exit) 93 bpm    Oxygen  Saturation (Admit) 92 %    Oxygen  Saturation (Exercise) 86 %    Oxygen  Saturation (Exit) 95 %    Rating of Perceived Exertion (Exercise) 12    Perceived Dyspnea (Exercise) 2    Duration Continue with 30 min of aerobic exercise without signs/symptoms of physical distress.    Intensity THRR unchanged      Progression   Progression Continue to progress workloads to maintain intensity without signs/symptoms of  physical distress.      Resistance Training   Training Prescription Yes    Weight blue bands    Reps 10-15    Time 10 Minutes      Oxygen    Oxygen  Continuous    Liters 2      Treadmill   MPH 1.8    Grade 0.5    Minutes 15    METs 2.2      NuStep   Level 4    SPM 68    Minutes 15    METs 2.3      Oxygen    Maintain Oxygen  Saturation 88% or higher             Social History   Tobacco Use  Smoking Status Former   Current packs/day: 0.00   Average packs/day: 1 pack/day for 30.0 years (30.0 ttl pk-yrs)   Types: Cigarettes   Start date: 12/07/1961   Quit date: 12/08/1991   Years since quitting: 32.1   Passive exposure: Past  Smokeless Tobacco Never    Goals Met:  Proper associated with RPD/PD & O2 Sat Independence with exercise equipment Exercise tolerated well No report of concerns or symptoms today Strength training completed today  Goals Unmet:  Not Applicable  Comments: Service time is from  1007 to 1128.    Dr. Genetta Kenning is Medical Director for Pulmonary Rehab at [facility].

## 2024-01-16 NOTE — Progress Notes (Signed)
 Pulmonary Individual Treatment Plan  Patient Details  Name: Marc Flores MRN: [id number] Date of Birth: April 12, 1941 Referring Provider:   [name] Pulmonary Rehab Walk Test from 11/05/2023 in [facility]  Referring Provider [name]  [[name]]       Initial Encounter Date:  Flowsheet Row Pulmonary Rehab Walk Test from 11/05/2023 in [facility]  Date 11/05/23       Visit Diagnosis: Chronic obstructive pulmonary disease, unspecified COPD type (HCC)  Patient's Home Medications on Admission:   Current Outpatient Medications:    albuterol  (VENTOLIN  HFA) 108 (90 Base) MCG/ACT inhaler, Inhale 2 puffs into the lungs every 6 (six) hours as needed for shortness of breath or wheezing., Disp: , Rfl:    atorvastatin  (LIPITOR) 10 MG tablet, TAKE 1 TABLET(10 MG) BY MOUTH DAILY, Disp: 90 tablet, Rfl: 0   cetirizine  (ZYRTEC  ALLERGY ) 10 MG tablet, Take 1 tablet (10 mg total) by mouth daily., Disp: 30 tablet, Rfl: 0   cholecalciferol  (VITAMIN D3) 25 MCG (1000 UNIT) tablet, Take 1,000 Units by mouth daily., Disp: , Rfl:    colchicine  0.6 MG tablet, TAKE 2 TABLETS BY MOUTH TWICE DAILY AS NEEDED FOR GOUT FLARE, Disp: 360 tablet, Rfl: 0   doxycycline  (VIBRAMYCIN ) 100 MG capsule, Take 1 capsule (100 mg total) by mouth 2 (two) times daily. One po bid x 7 days, Disp: 14 capsule, Rfl: 0   famotidine  (PEPCID ) 20 MG tablet, One after supper (Patient taking differently: Take 20 mg by mouth daily after breakfast.), Disp: 30 tablet, Rfl: 11   ipratropium-albuterol  (DUONEB) 0.5-2.5 (3) MG/3ML SOLN, Take 3 mLs by nebulization 2 (two) times daily., Disp: , Rfl:    metoprolol  succinate (TOPROL -XL) 25 MG 24 hr tablet, Take 1 tablet (25 mg total) by mouth daily., Disp: 90 tablet, Rfl: 3   mometasone  (ASMANEX ) 220 MCG/ACT inhaler, Inhale 2 puffs into the lungs at bedtime., Disp: , Rfl:    montelukast   (SINGULAIR ) 10 MG tablet, Take 10 mg by mouth daily., Disp: , Rfl:    pantoprazole  (PROTONIX ) 40 MG tablet, TAKE 1 TABLET(40 MG) BY MOUTH DAILY 30 TO 60 MINUTES BEFORE FIRST MEAL OF THE DAY, Disp: 30 tablet, Rfl: 5   predniSONE  (DELTASONE ) 50 MG tablet, Take 1 tablet (50 mg total) by mouth daily., Disp: 5 tablet, Rfl: 0   tamsulosin  (FLOMAX ) 0.4 MG CAPS capsule, Take 0.4 mg by mouth daily after supper., Disp: , Rfl:    Tiotropium Bromide-Olodaterol 2.5-2.5 MCG/ACT AERS, Take 2 puffs by mouth daily., Disp: , Rfl:  No current facility-administered medications for this encounter.  Facility-Administered Medications Ordered in Other Encounters:    gemcitabine  (GEMZAR ) chemo syringe for bladder instillation 2,000 mg, 2,000 mg, Bladder Instillation, Once, Trent Frizzle, MD  Past Medical History: Past Medical History:  Diagnosis Date   Arthritis    Bruises easily    Cancer (HCC)    BLADDER, sees Dr. Joie Narrow   COPD (chronic obstructive pulmonary disease) [facility])    sees Dr. Waymond Hailey     Diverticulitis    Dyspnea    ON EXERTION   History of chickenpox    Hyperlipidemia     Tobacco Use: Social History   Tobacco Use  Smoking Status Former   Current packs/day: 0.00   Average packs/day: 1 pack/day for 30.0 years (30.0 ttl pk-yrs)   Types: Cigarettes   Start date: 12/07/1961   Quit date: 12/08/1991  Years since quitting: 32.1   Passive exposure: Past  Smokeless Tobacco Never    Labs: Review Flowsheet       Latest Ref Rng & Units 12/04/2009 05/12/2021  Labs for ITP Cardiac and Pulmonary Rehab  Cholestrol 0 - 200 mg/dL - 284   LDL (calc) 0 - 99 mg/dL - 81   HDL-C >13.24 mg/dL - 40.10   Trlycerides 0.0 - 149.0 mg/dL - 27.2   Hemoglobin Z3G 4.6 - 6.5 % - 5.5   PH, Arterial 7.350 - 7.450 7.419  -  PCO2 arterial 35.0 - 45.0 mmHg 42.6  -  Bicarbonate 20.0 - 24.0 mEq/L 27.0  -  TCO2 0 - 100 mmol/L 28.4  -  O2 Saturation % 93.6  -    Capillary Blood Glucose: No results found for:  "GLUCAP"   Pulmonary Assessment Scores:  Pulmonary Assessment Scores     Row Name 11/05/23 0925 01/15/24 1509       ADL UCSD   ADL Phase Entry Exit    SOB Score total 29 36      CAT Score   CAT Score 12 17      mMRC Score   mMRC Score 4 --            UCSD: Self-administered rating of dyspnea associated with activities of daily living (ADLs) 6-point scale (0 = "not at all" to 5 = "maximal or unable to do because of breathlessness")  Scoring Scores range from 0 to 120.  Minimally important difference is 5 units  CAT: CAT can identify the health impairment of COPD patients and is better correlated with disease progression.  CAT has a scoring range of zero to 40. The CAT score is classified into four groups of low (less than 10), medium (10 - 20), high (21-30) and very high (31-40) based on the impact level of disease on health status. A CAT score over 10 suggests significant symptoms.  A worsening CAT score could be explained by an exacerbation, poor medication adherence, poor inhaler technique, or progression of COPD or comorbid conditions.  CAT MCID is 2 points  mMRC: mMRC (Modified Medical Research Council) Dyspnea Scale is used to assess the degree of baseline functional disability in patients of respiratory disease due to dyspnea. No minimal important difference is established. A decrease in score of 1 point or greater is considered a positive change.   Pulmonary Function Assessment:   Exercise Target Goals: Exercise Program Goal: Individual exercise prescription set using results from initial 6 min walk test and THRR while considering  patient's activity barriers and safety.   Exercise Prescription Goal: Initial exercise prescription builds to 30-45 minutes a day of aerobic activity, 2-3 days per week.  Home exercise guidelines will be given to patient during program as part of exercise prescription that the participant will acknowledge.  Activity Barriers & Risk  Stratification:  Activity Barriers & Cardiac Risk Stratification - 11/05/23 0938       Activity Barriers & Cardiac Risk Stratification   Activity Barriers Arthritis;Deconditioning;Muscular Weakness;Shortness of Breath             6 Minute Walk:  6 Minute Walk     Row Name 11/05/23 1109         6 Minute Walk   Phase Initial     Distance 1110 feet     Walk Time 6 minutes     # of Rest Breaks 2  1:50-2:10, 3:37-4:00     MPH 2.1  METS 2.23     RPE 13     Perceived Dyspnea  4     VO2 Peak 7.81     Symptoms No     Resting HR 99 bpm     Resting BP 120/70     Resting Oxygen  Saturation  92 %     Exercise Oxygen  Saturation  during 6 min walk 85 %     Max Ex. HR 123 bpm     Max Ex. BP 148/76     2 Minute Post BP 132/70       Interval HR   1 Minute HR 110     2 Minute HR 121     3 Minute HR 122     4 Minute HR 119     5 Minute HR 121     6 Minute HR 123     2 Minute Post HR 123     Interval Heart Rate? Yes       Interval Oxygen    Interval Oxygen ? Yes     Baseline Oxygen  Saturation % 92 %     1 Minute Oxygen  Saturation % 95 %     1 Minute Liters of Oxygen  0 L     2 Minute Oxygen  Saturation % 91 %  85% @ 1:50, standing rest break till pt reached 91%     2 Minute Liters of Oxygen  0 L     3 Minute Oxygen  Saturation % 89 %     3 Minute Liters of Oxygen  0 L     4 Minute Oxygen  Saturation % 92 %  86% @ 3:37     4 Minute Liters of Oxygen  0 L  increased to 1L     5 Minute Oxygen  Saturation % 92 %     5 Minute Liters of Oxygen  1 L     6 Minute Oxygen  Saturation % 90 %     6 Minute Liters of Oxygen  1 L     2 Minute Post Oxygen  Saturation % 95 %     2 Minute Post Liters of Oxygen  1 L              Oxygen  Initial Assessment:  Oxygen  Initial Assessment - 11/05/23 0925       Home Oxygen    Home Oxygen  Device None    Sleep Oxygen  Prescription None    Home Exercise Oxygen  Prescription None    Home Resting Oxygen  Prescription None      Initial 6 min Walk    Oxygen  Used Continuous    Liters per minute 1      Program Oxygen  Prescription   Program Oxygen  Prescription Continuous    Liters per minute 1      Intervention   Short Term Goals To learn and exhibit compliance with exercise, home and travel O2 prescription;To learn and understand importance of maintaining oxygen  saturations>88%;To learn and demonstrate proper use of respiratory medications;To learn and understand importance of monitoring SPO2 with pulse oximeter and demonstrate accurate use of the pulse oximeter.;To learn and demonstrate proper pursed lip breathing techniques or other breathing techniques.     Long  Term Goals Exhibits compliance with exercise, home  and travel O2 prescription;Verbalizes importance of monitoring SPO2 with pulse oximeter and return demonstration;Maintenance of O2 saturations>88%;Exhibits proper breathing techniques, such as pursed lip breathing or other method taught during program session;Compliance with respiratory medication;Demonstrates proper use of MDI's  Oxygen  Re-Evaluation:  Oxygen  Re-Evaluation     Row Name 11/19/23 0935 12/12/23 1001 01/11/24 0929         Program Oxygen  Prescription   Program Oxygen  Prescription Continuous Continuous Continuous     Liters per minute 1 1 2        Home Oxygen    Home Oxygen  Device None None None     Sleep Oxygen  Prescription None None None     Home Exercise Oxygen  Prescription None None None     Home Resting Oxygen  Prescription None None None       Goals/Expected Outcomes   Short Term Goals To learn and exhibit compliance with exercise, home and travel O2 prescription;To learn and understand importance of maintaining oxygen  saturations>88%;To learn and demonstrate proper use of respiratory medications;To learn and understand importance of monitoring SPO2 with pulse oximeter and demonstrate accurate use of the pulse oximeter.;To learn and demonstrate proper pursed lip breathing techniques or other  breathing techniques.  To learn and exhibit compliance with exercise, home and travel O2 prescription;To learn and understand importance of maintaining oxygen  saturations>88%;To learn and demonstrate proper use of respiratory medications;To learn and understand importance of monitoring SPO2 with pulse oximeter and demonstrate accurate use of the pulse oximeter.;To learn and demonstrate proper pursed lip breathing techniques or other breathing techniques.  To learn and exhibit compliance with exercise, home and travel O2 prescription;To learn and understand importance of maintaining oxygen  saturations>88%;To learn and demonstrate proper use of respiratory medications;To learn and understand importance of monitoring SPO2 with pulse oximeter and demonstrate accurate use of the pulse oximeter.;To learn and demonstrate proper pursed lip breathing techniques or other breathing techniques.      Long  Term Goals Exhibits compliance with exercise, home  and travel O2 prescription;Verbalizes importance of monitoring SPO2 with pulse oximeter and return demonstration;Maintenance of O2 saturations>88%;Exhibits proper breathing techniques, such as pursed lip breathing or other method taught during program session;Compliance with respiratory medication;Demonstrates proper use of MDI's Exhibits compliance with exercise, home  and travel O2 prescription;Verbalizes importance of monitoring SPO2 with pulse oximeter and return demonstration;Maintenance of O2 saturations>88%;Exhibits proper breathing techniques, such as pursed lip breathing or other method taught during program session;Compliance with respiratory medication;Demonstrates proper use of MDI's Exhibits compliance with exercise, home  and travel O2 prescription;Verbalizes importance of monitoring SPO2 with pulse oximeter and return demonstration;Maintenance of O2 saturations>88%;Exhibits proper breathing techniques, such as pursed lip breathing or other method taught during  program session;Compliance with respiratory medication;Demonstrates proper use of MDI's     Goals/Expected Outcomes Compliance and understanding of oxygen  saturation monitoring and breathing techniques to decrease shortness of breath. Compliance and understanding of oxygen  saturation monitoring and breathing techniques to decrease shortness of breath. Compliance and understanding of oxygen  saturation monitoring and breathing techniques to decrease shortness of breath.              Oxygen  Discharge (Final Oxygen  Re-Evaluation):  Oxygen  Re-Evaluation - 01/11/24 0929       Program Oxygen  Prescription   Program Oxygen  Prescription Continuous    Liters per minute 2      Home Oxygen    Home Oxygen  Device None    Sleep Oxygen  Prescription None    Home Exercise Oxygen  Prescription None    Home Resting Oxygen  Prescription None      Goals/Expected Outcomes   Short Term Goals To learn and exhibit compliance with exercise, home and travel O2 prescription;To learn and understand importance of maintaining oxygen  saturations>88%;To learn and demonstrate proper use of  respiratory medications;To learn and understand importance of monitoring SPO2 with pulse oximeter and demonstrate accurate use of the pulse oximeter.;To learn and demonstrate proper pursed lip breathing techniques or other breathing techniques.     Long  Term Goals Exhibits compliance with exercise, home  and travel O2 prescription;Verbalizes importance of monitoring SPO2 with pulse oximeter and return demonstration;Maintenance of O2 saturations>88%;Exhibits proper breathing techniques, such as pursed lip breathing or other method taught during program session;Compliance with respiratory medication;Demonstrates proper use of MDI's    Goals/Expected Outcomes Compliance and understanding of oxygen  saturation monitoring and breathing techniques to decrease shortness of breath.             Initial Exercise Prescription:  Initial Exercise  Prescription - 11/05/23 1100       Date of Initial Exercise RX and Referring Provider   Date 11/05/23    Referring Provider Harless Lien   Expected Discharge Date 01/31/24      Oxygen    Oxygen  Continuous    Liters 1    Maintain Oxygen  Saturation 88% or higher      Treadmill   MPH 1.6    Grade 0    Minutes 15      NuStep   Level 1    SPM 60    Minutes 15      Prescription Details   Frequency (times per week) 2    Duration Progress to 30 minutes of continuous aerobic without signs/symptoms of physical distress      Intensity   THRR 40-80% of Max Heartrate 56-111    Ratings of Perceived Exertion 11-13    Perceived Dyspnea 0-4      Progression   Progression Continue to progress workloads to maintain intensity without signs/symptoms of physical distress.      Resistance Training   Training Prescription Yes    Weight blue bands    Reps 10-15             Perform Capillary Blood Glucose checks as needed.  Exercise Prescription Changes:   Exercise Prescription Changes     Row Name 12/04/23 1200 12/18/23 1200 01/01/24 1200 01/15/24 1100       Response to Exercise   Blood Pressure (Admit) 108/62 124/70 122/70 144/62    Blood Pressure (Exercise) 150/82 146/70 152/68 150/70    Blood Pressure (Exit) 112/64 130/60 120/68 118/62    Heart Rate (Admit) 68 bpm 77 bpm 67 bpm 91 bpm    Heart Rate (Exercise) 109 bpm 107 bpm 110 bpm 108 bpm    Heart Rate (Exit) 89 bpm 79 bpm 77 bpm 93 bpm    Oxygen  Saturation (Admit) 98 %  2L 96 % 92 % 92 %    Oxygen  Saturation (Exercise) 91 %  2L 92 % 92 % 86 %    Oxygen  Saturation (Exit) 93 %  RA 93 % 94 % 95 %    Rating of Perceived Exertion (Exercise) 13 12 14 12     Perceived Dyspnea (Exercise) 2 2 2 2     Duration Continue with 30 min of aerobic exercise without signs/symptoms of physical distress. Continue with 30 min of aerobic exercise without signs/symptoms of physical distress. Continue with 30 min of aerobic exercise without  signs/symptoms of physical distress. Continue with 30 min of aerobic exercise without signs/symptoms of physical distress.    Intensity THRR unchanged THRR unchanged THRR unchanged THRR unchanged      Progression   Progression Continue to progress workloads to maintain intensity  without signs/symptoms of physical distress. Continue to progress workloads to maintain intensity without signs/symptoms of physical distress. Continue to progress workloads to maintain intensity without signs/symptoms of physical distress. Continue to progress workloads to maintain intensity without signs/symptoms of physical distress.      Resistance Training   Training Prescription Yes Yes Yes Yes    Weight blue bands blue bands blue bands blue bands    Reps 10-15 10-15 10-15 10-15    Time 10 Minutes 10 Minutes 10 Minutes 10 Minutes      Oxygen    Oxygen  Continuous Continuous Continuous Continuous    Liters 0-2 2 2 2       Treadmill   MPH 1.5 1.5 1.6 1.8    Grade 0 0 1 0.5    Minutes 15 15 15 15     METs 2.15 2 2.3 2.2      NuStep   Level 2 3 4 4     SPM 75 76 76 68    Minutes 15 15 15 15     METs 2 2.3 2.5 2.3      Oxygen    Maintain Oxygen  Saturation 88% or higher 88% or higher -- 88% or higher             Exercise Comments:   Exercise Comments     Row Name 11/22/23 1144 01/15/24 1535         Exercise Comments Pt completed first day of exercise. Pt walked on treadmill for 15 min, 1.3 mph, 0% incline, METs 1.9. He then exercised on the recumbent stepper for 15 min, level 1, METs 1.7. Tolerated well. Performed warm up and cool down with verbal cues, including squats. Discussed METs with good reception. Completed home exercise plan. Marc Flores is not currently exercising at home. I encouraged him to exercise at the [facility]. I told him he could walk on the treadmill or cycle on the recumbent bike. I recommended exercising 2 days/wk for at least 30 min/day. He agreed with my recommendations. I feel confident in  carrying out an exercise regimen at home. I am unsure how much he will progress on his own as Marc Flores has not felt like his shortness of breath has been the same.               Exercise Goals and Review:   Exercise Goals     Row Name 11/05/23 0924             Exercise Goals   Increase Physical Activity Yes       Intervention Provide advice, education, support and counseling about physical activity/exercise needs.;Develop an individualized exercise prescription for aerobic and resistive training based on initial evaluation findings, risk stratification, comorbidities and participant's personal goals.       Expected Outcomes Short Term: Attend rehab on a regular basis to increase amount of physical activity.;Long Term: Add in home exercise to make exercise part of routine and to increase amount of physical activity.;Long Term: Exercising regularly at least 3-5 days a week.       Increase Strength and Stamina Yes       Intervention Provide advice, education, support and counseling about physical activity/exercise needs.;Develop an individualized exercise prescription for aerobic and resistive training based on initial evaluation findings, risk stratification, comorbidities and participant's personal goals.       Expected Outcomes Short Term: Increase workloads from initial exercise prescription for resistance, speed, and METs.;Short Term: Perform resistance training exercises routinely during rehab and add in resistance training at  home;Long Term: Improve cardiorespiratory fitness, muscular endurance and strength as measured by increased METs and functional capacity ( )       Able to understand and use rate of perceived exertion (RPE) scale Yes       Intervention Provide education and explanation on how to use RPE scale       Expected Outcomes Short Term: Able to use RPE daily in rehab to express subjective intensity level;Long Term:  Able to use RPE to guide intensity level when exercising  independently       Able to understand and use Dyspnea scale Yes       Intervention Provide education and explanation on how to use Dyspnea scale       Expected Outcomes Short Term: Able to use Dyspnea scale daily in rehab to express subjective sense of shortness of breath during exertion;Long Term: Able to use Dyspnea scale to guide intensity level when exercising independently       Knowledge and understanding of Target Heart Rate Range (THRR) Yes       Intervention Provide education and explanation of THRR including how the numbers were predicted and where they are located for reference       Expected Outcomes Short Term: Able to state/look up THRR;Long Term: Able to use THRR to govern intensity when exercising independently;Short Term: Able to use daily as guideline for intensity in rehab       Understanding of Exercise Prescription Yes       Intervention Provide education, explanation, and written materials on patient's individual exercise prescription       Expected Outcomes Short Term: Able to explain program exercise prescription;Long Term: Able to explain home exercise prescription to exercise independently                Exercise Goals Re-Evaluation :  Exercise Goals Re-Evaluation     Row Name 11/19/23 0934 12/12/23 0945 01/11/24 0924         Exercise Goal Re-Evaluation   Exercise Goals Review Increase Physical Activity;Able to understand and use Dyspnea scale;Understanding of Exercise Prescription;Increase Strength and Stamina;Knowledge and understanding of Target Heart Rate Range (THRR);Able to understand and use rate of perceived exertion (RPE) scale Increase Physical Activity;Able to understand and use Dyspnea scale;Understanding of Exercise Prescription;Increase Strength and Stamina;Knowledge and understanding of Target Heart Rate Range (THRR);Able to understand and use rate of perceived exertion (RPE) scale Increase Physical Activity;Able to understand and use Dyspnea  scale;Understanding of Exercise Prescription;Increase Strength and Stamina;Knowledge and understanding of Target Heart Rate Range (THRR);Able to understand and use rate of perceived exertion (RPE) scale     Comments Marc Flores was scheduled to begin exercise on 3/13 but called out due to other appointments. Pt will start exercise on 3/18. Will continue to monitor and progress as able. Marc Flores has completed 6 exercise sessions. He exercises for 15 min on the treadmill and Nustep. Marc Flores averages 2.0 METs at 1.5 mph on the treadmill and 3.0 METs at level 2 on the Nustep. He performs the warmup and cooldown standing without limitations. Marc Flores has increased his speed on the treadmill and level on the Nustep. He is very deconditioned as I am unsure how much progress he will make. Will continue to monitor and progress as able. Marc Flores has completed 15 exercise sessions. He exercises for 15 min on the treadmill and Nustep. Marc Flores averages 2.2 METs at 1.8 mph and 0.5% incline on the treadmill and 2.3 METs at level 4 on the Nustep. He performs the  warmup and cooldown standing without limitations. Marc Flores has increased his speed and incline on the treadmill and level on the Nustep. He tolerates progression fair. I have not discussed home exercise yet but plan to soon. Will continue to monitor and progress as able.     Expected Outcomes Through exercise at rehab and home, the patient will decrease shortness of breath with daily activities and feel confident in carrying out an exercise regimen at home. Through exercise at rehab and home, the patient will decrease shortness of breath with daily activities and feel confident in carrying out an exercise regimen at home. Through exercise at rehab and home, the patient will decrease shortness of breath with daily activities and feel confident in carrying out an exercise regimen at home.              Discharge Exercise Prescription (Final Exercise Prescription Changes):  Exercise  Prescription Changes - 01/15/24 1100       Response to Exercise   Blood Pressure (Admit) 144/62    Blood Pressure (Exercise) 150/70    Blood Pressure (Exit) 118/62    Heart Rate (Admit) 91 bpm    Heart Rate (Exercise) 108 bpm    Heart Rate (Exit) 93 bpm    Oxygen  Saturation (Admit) 92 %    Oxygen  Saturation (Exercise) 86 %    Oxygen  Saturation (Exit) 95 %    Rating of Perceived Exertion (Exercise) 12    Perceived Dyspnea (Exercise) 2    Duration Continue with 30 min of aerobic exercise without signs/symptoms of physical distress.    Intensity THRR unchanged      Progression   Progression Continue to progress workloads to maintain intensity without signs/symptoms of physical distress.      Resistance Training   Training Prescription Yes    Weight blue bands    Reps 10-15    Time 10 Minutes      Oxygen    Oxygen  Continuous    Liters 2      Treadmill   MPH 1.8    Grade 0.5    Minutes 15    METs 2.2      NuStep   Level 4    SPM 68    Minutes 15    METs 2.3      Oxygen    Maintain Oxygen  Saturation 88% or higher             Nutrition:  Target Goals: Understanding of nutrition guidelines, daily intake of sodium 1500mg , cholesterol 200mg , calories 30% from fat and 7% or less from saturated fats, daily to have 5 or more servings of fruits and vegetables.  Biometrics:    Nutrition Therapy Plan and Nutrition Goals:  Nutrition Therapy & Goals - 01/15/24 1147       Nutrition Therapy   Diet General healthy diet    Drug/Food Interactions Statins/Certain Fruits      Personal Nutrition Goals   Nutrition Goal Patient to improve diet quality by using the plate method as a guide for meal planning to include lean protein/plant protein, fruits, vegetables, whole grains, nonfat dairy as part of a well-balanced diet.   goal in progress.   Comments Goal in progress. Marc Flores has medical history of HTN, COPDIII, bonchiectasis, OSA. Reviewed strategies for weight loss  including benefits of high fiber/high protein intake, calorie density, the plate method as a guide for meal planning, etc. He is down 4# since starting with our program. Marc Flores will benefit from participation in pulmonary for exericse  and nutrition support.      Intervention Plan   Intervention Prescribe, educate and counsel regarding individualized specific dietary modifications aiming towards targeted core components such as weight, hypertension, lipid management, diabetes, heart failure and other comorbidities.;Nutrition handout(s) given to patient.    Expected Outcomes Short Term Goal: Understand basic principles of dietary content, such as calories, fat, sodium, cholesterol and nutrients.;Long Term Goal: Adherence to prescribed nutrition plan.             Nutrition Assessments:  Nutrition Assessments - 11/22/23 1143       Rate Your Plate Scores   Pre Score 50            MEDIFICTS Score Key: >=70 Need to make dietary changes  40-70 Heart Healthy Diet <= 40 Therapeutic Level Cholesterol Diet   Picture Your Plate Scores: <42 Unhealthy dietary pattern with much room for improvement. 41-50 Dietary pattern unlikely to meet recommendations for good health and room for improvement. 51-60 More healthful dietary pattern, with some room for improvement.  >60 Healthy dietary pattern, although there may be some specific behaviors that could be improved.    Nutrition Goals Re-Evaluation:  Nutrition Goals Re-Evaluation     Row Name 11/27/23 1113 12/13/23 1423 01/15/24 1147         Goals   Current Weight 181 lb 7 oz (82.3 kg) 179 lb 0.2 oz (81.2 kg) 177 lb 7.5 oz (80.5 kg)     Comment Cr 1.91, GFR 35; other most recent labs from September 2022 lipids WNL, A1c WNL, LDL 81 Cr 1.52, GFR 46; other most recent labs other most recent labs from September 2022 lipids WNL, A1c WNL, LDL 81 Cr 1.52, GFR 46; other most recent labs other most recent labs from September 2022 lipids WNL, A1c WNL,  LDL 81     Expected Outcome Marc Flores has medical history of HTN, COPDIII, bonchiectasis, OSA. He reports motivation to lose weight; however, he admits recent weight loss is unintentional. Marc Flores is down 5.5# (1.8# per week) since orientation to our program. Discussed eating frequency and protein intake to aid with meeting nutrition needs; will continue to monitor weight. Marc Flores will benefit from participation in pulmonary for exericse and nutrition support. Goal in progress. Marc Flores has medical history of HTN, COPDIII, bonchiectasis, OSA. Reviewed strategies for weight loss including benefits of high fiber/high protein intake, calorie density, the plate method as a guide for meal planning, etc. He is down 2.4# since starting with our program. Marc Flores will benefit from participation in pulmonary for exericse and nutrition support. Goal in progress. Marc Flores has medical history of HTN, COPDIII, bonchiectasis, OSA. Reviewed strategies for weight loss including benefits of high fiber/high protein intake, calorie density, the plate method as a guide for meal planning, etc. He is down 4# since starting with our program. Marc Flores will benefit from participation in pulmonary for exericse and nutrition support.              Nutrition Goals Discharge (Final Nutrition Goals Re-Evaluation):  Nutrition Goals Re-Evaluation - 01/15/24 1147       Goals   Current Weight 177 lb 7.5 oz (80.5 kg)    Comment Cr 1.52, GFR 46; other most recent labs other most recent labs from September 2022 lipids WNL, A1c WNL, LDL 81    Expected Outcome Goal in progress. Marc Flores has medical history of HTN, COPDIII, bonchiectasis, OSA. Reviewed strategies for weight loss including benefits of high fiber/high protein intake, calorie density, the plate method as a  guide for meal planning, etc. He is down 4# since starting with our program. Marc Flores will benefit from participation in pulmonary for exericse and nutrition support.              Psychosocial: Target Goals: Acknowledge presence or absence of significant depression and/or stress, maximize coping skills, provide positive support system. Participant is able to verbalize types and ability to use techniques and skills needed for reducing stress and depression.  Initial Review & Psychosocial Screening:  Initial Psych Review & Screening - 11/05/23 0927       Initial Review   Current issues with None Identified      Family Dynamics   Good Support System? Yes    Comments spouse      Barriers   Psychosocial barriers to participate in program There are no identifiable barriers or psychosocial needs.      Screening Interventions   Interventions Encouraged to exercise             Quality of Life Scores:  Scores of 19 and below usually indicate a poorer quality of life in these areas.  A difference of  2-3 points is a clinically meaningful difference.  A difference of 2-3 points in the total score of the Quality of Life Index has been associated with significant improvement in overall quality of life, self-image, physical symptoms, and general health in studies assessing change in quality of life.  PHQ-9: Review Flowsheet  More data exists      01/15/2024 11/05/2023 12/21/2022 12/19/2021 05/12/2021  Depression screen PHQ 2/9  Decreased Interest 0 0 0 0 0  Down, Depressed, Hopeless 0 0 0 0 0  PHQ - 2 Score 0 0 0 0 0  Altered sleeping 0 0 - - 0  Tired, decreased energy 0 0 - - 0  Change in appetite 0 0 - - 0  Feeling bad or failure about yourself  0 0 - - 0  Trouble concentrating 0 0 - - 0  Moving slowly or fidgety/restless 0 0 - - 0  Suicidal thoughts 0 0 - - 0  PHQ-9 Score 0 0 - - 0  Difficult doing work/chores Not difficult at all Not difficult at all - - Not difficult at all   Interpretation of Total Score  Total Score Depression Severity:  1-4 = Minimal depression, 5-9 = Mild depression, 10-14 = Moderate depression, 15-19 = Moderately severe depression,  20-27 = Severe depression   Psychosocial Evaluation and Intervention:  Psychosocial Evaluation - 11/05/23 0928       Psychosocial Evaluation & Interventions   Interventions Encouraged to exercise with the program and follow exercise prescription    Comments Marc Flores denies any psychosocial barriers at this time.    Expected Outcomes For Marc Flores to participate in rehab free of psychosocial barriers.             Psychosocial Re-Evaluation:  Psychosocial Re-Evaluation     Row Name 11/14/23 1254 12/10/23 0931 01/08/24 0836         Psychosocial Re-Evaluation   Current issues with None Identified None Identified None Identified     Comments Marc Flores has not attended any sessions so far. No new psychosocial barriers or concerns since orientation. Marc Flores reports no new psychosocial barriers or concerns at this time. Marc Flores continues to deny any psychosocial barriers or concerns at this time.     Expected Outcomes For Marc Flores to attend PR free of any psychosocial barriers or concerns For Marc Flores to attend PR free  of any psychosocial barriers or concerns For Marc Flores to attend PR free of any psychosocial barriers or concerns     Interventions Encouraged to attend Pulmonary Rehabilitation for the exercise Encouraged to attend Pulmonary Rehabilitation for the exercise Encouraged to attend Pulmonary Rehabilitation for the exercise     Continue Psychosocial Services  No Follow up required No Follow up required No Follow up required              Psychosocial Discharge (Final Psychosocial Re-Evaluation):  Psychosocial Re-Evaluation - 01/08/24 0836       Psychosocial Re-Evaluation   Current issues with None Identified    Comments Marc Flores continues to deny any psychosocial barriers or concerns at this time.    Expected Outcomes For Marc Flores to attend PR free of any psychosocial barriers or concerns    Interventions Encouraged to attend Pulmonary Rehabilitation for the exercise    Continue Psychosocial Services   No Follow up required             Education: Education Goals: Education classes will be provided on a weekly basis, covering required topics. Participant will state understanding/return demonstration of topics presented.  Learning Barriers/Preferences:  Learning Barriers/Preferences - 11/05/23 1610       Learning Barriers/Preferences   Learning Barriers Sight    Learning Preferences None             Education Topics: Know Your Numbers Group instruction that is supported by a PowerPoint presentation. Instructor discusses importance of knowing and understanding resting, exercise, and post-exercise oxygen  saturation, heart rate, and blood pressure. Oxygen  saturation, heart rate, blood pressure, rating of perceived exertion, and dyspnea are reviewed along with a normal range for these values.  Flowsheet Row PULMONARY REHAB CHRONIC OBSTRUCTIVE PULMONARY DISEASE from 12/06/2023 in [facility]  Date 12/06/23  Educator EP  Instruction Review Code 1- Verbalizes Understanding       Exercise for the Pulmonary Patient Group instruction that is supported by a PowerPoint presentation. Instructor discusses benefits of exercise, core components of exercise, frequency, duration, and intensity of an exercise routine, importance of utilizing pulse oximetry during exercise, safety while exercising, and options of places to exercise outside of rehab.  Flowsheet Row PULMONARY REHAB CHRONIC OBSTRUCTIVE PULMONARY DISEASE from 11/29/2023 in [facility], [facility]  Date 11/29/23  Educator EP  Instruction Review Code 1- Verbalizes Understanding       MET Level  Group instruction provided by PowerPoint, verbal discussion, and written material to support subject matter. Instructor reviews what METs are and how to increase METs.    Pulmonary Medications Verbally interactive group education  provided by instructor with focus on inhaled medications and proper administration. Flowsheet Row PULMONARY REHAB CHRONIC OBSTRUCTIVE PULMONARY DISEASE from 11/22/2023 in [facility], Vascular, [facility]  Date 11/22/23  Educator RT  Instruction Review Code 1- Verbalizes Understanding       Anatomy and Physiology of the Respiratory System Group instruction provided by PowerPoint, verbal discussion, and written material to support subject matter. Instructor reviews respiratory cycle and anatomical components of the respiratory system and their functions. Instructor also reviews differences in obstructive and restrictive respiratory diseases with examples of each.    Oxygen  Safety Group instruction provided by PowerPoint, verbal discussion, and written material to support subject matter. There is an overview of "What is Oxygen " and "Why do we need it".  Instructor also  reviews how to create a safe environment for oxygen  use, the importance of using oxygen  as prescribed, and the risks of noncompliance. There is a brief discussion on traveling with oxygen  and resources the patient may utilize. Flowsheet Row PULMONARY REHAB CHRONIC OBSTRUCTIVE PULMONARY DISEASE from 12/13/2023 in [facility] for [facility], Vascular, & Lung Health  Date 12/13/23  Educator RN  Instruction Review Code 1- Verbalizes Understanding       Oxygen  Use Group instruction provided by PowerPoint, verbal discussion, and written material to discuss how supplemental oxygen  is prescribed and different types of oxygen  supply systems. Resources for more information are provided.  Flowsheet Row PULMONARY REHAB CHRONIC OBSTRUCTIVE PULMONARY DISEASE from 12/20/2023 in [facility] for Heart, Vascular, & Lung Health  Date 12/20/23  Educator RT  Instruction Review Code 1- Verbalizes Understanding       Breathing Techniques Group instruction that is  supported by demonstration and informational handouts. Instructor discusses the benefits of pursed lip and diaphragmatic breathing and detailed demonstration on how to perform both.  Flowsheet Row PULMONARY REHAB CHRONIC OBSTRUCTIVE PULMONARY DISEASE from 12/27/2023 in [facility] for Heart, Vascular, & Lung Health  Date 12/27/23  Educator RN  Instruction Review Code 1- Verbalizes Understanding        Risk Factor Reduction Group instruction that is supported by a PowerPoint presentation. Instructor discusses the definition of a risk factor, different risk factors for pulmonary disease, and how the heart and lungs work together.   Pulmonary Diseases Group instruction provided by PowerPoint, verbal discussion, and written material to support subject matter. Instructor gives an overview of the different type of pulmonary diseases. There is also a discussion on risk factors and symptoms as well as ways to manage the diseases.   Stress and Energy Conservation Group instruction provided by PowerPoint, verbal discussion, and written material to support subject matter. Instructor gives an overview of stress and the impact it can have on the body. Instructor also reviews ways to reduce stress. There is also a discussion on energy conservation and ways to conserve energy throughout the day. Flowsheet Row PULMONARY REHAB CHRONIC OBSTRUCTIVE PULMONARY DISEASE from 01/03/2024 in [facility] for Heart, Vascular, & Lung Health  Date 01/03/24  Educator RN  Instruction Review Code 1- Verbalizes Understanding       Warning Signs and Symptoms Group instruction provided by PowerPoint, verbal discussion, and written material to support subject matter. Instructor reviews warning signs and symptoms of stroke, heart attack, cold and flu. Instructor also reviews ways to prevent the spread of infection. Flowsheet Row PULMONARY REHAB CHRONIC OBSTRUCTIVE PULMONARY  DISEASE from 01/10/2024 in [facility] for Heart, Vascular, & Lung [facility]  Date 01/10/24  Educator RN  Instruction Review Code 1- Verbalizes Understanding       Other Education Group or individual verbal, written, or video instructions that support the educational goals of the pulmonary rehab program.    Knowledge Questionnaire Score:  Knowledge Questionnaire Score - 01/15/24 1506       Knowledge Questionnaire Score   Post Score 18/18             Core Components/Risk Factors/Patient Goals at Admission:  Personal Goals and Risk Factors at Admission - 11/05/23 0924       Core Components/Risk Factors/Patient Goals on Admission    Weight Management Weight Loss    Expected Outcomes Short Term: Continue to assess and modify interventions until short term  weight is achieved;Long Term: Adherence to nutrition and physical activity/exercise program aimed toward attainment of established weight goal;Weight Maintenance: Understanding of the daily nutrition guidelines, which includes 25-35% calories from fat, 7% or less cal from saturated fats, less than 200mg  cholesterol, less than 1.5gm of sodium, & 5 or more servings of fruits and vegetables daily;Understanding recommendations for meals to include 15-35% energy as protein, 25-35% energy from fat, 35-60% energy from carbohydrates, less than 200mg  of dietary cholesterol, 20-35 gm of total fiber daily;Weight Loss: Understanding of general recommendations for a balanced deficit meal plan, which promotes 1-2 lb weight loss per week and includes a negative energy balance of [phone] kcal/d;Understanding of distribution of calorie intake throughout the day with the consumption of 4-5 meals/snacks    Improve shortness of breath with ADL's Yes    Intervention Provide education, individualized exercise plan and daily activity instruction to help decrease symptoms of SOB with activities of daily living.    Expected Outcomes Short  Term: Improve cardiorespiratory fitness to achieve a reduction of symptoms when performing ADLs;Long Term: Be able to perform more ADLs without symptoms or delay the onset of symptoms             Core Components/Risk Factors/Patient Goals Review:   Goals and Risk Factor Review     Row Name 11/14/23 1302 12/10/23 0933 01/08/24 0837         Core Components/Risk Factors/Patient Goals Review   Personal Goals Review Weight Management/Obesity;Improve shortness of breath with ADL's;Develop more efficient breathing techniques such as purse lipped breathing and diaphragmatic breathing and practicing self-pacing with activity. Weight Management/Obesity;Improve shortness of breath with ADL's;Develop more efficient breathing techniques such as purse lipped breathing and diaphragmatic breathing and practicing self-pacing with activity. Weight Management/Obesity;Improve shortness of breath with ADL's     Review Marc Flores has not started PR class yet. Unable to assess goals at this time. Goal progressing for weight loss. Goal progressing for improving shortness of breath with ADL's. Marc Flores is currently requiring 2L of O2 while exercising. He is currently exercising on the Treadmill and the Nustep. Goal progressing for developing more efficient breathing techniques such as purse lipped breathing and diaphragmatic breathing; and practicing self-pacing with activity. Monthly review of patient's Core Components/Risk Factors/Patient Goals are as follows: Goal progressing for improving shortness of breath with ADL's. Marc Flores is currently using 2L O2 to maintain sats >88% while exercising. He is currently exercising on the treadmill and the Nustep. Goal met for developing more efficient breathing techniques such as purse lipped breathing and diaphragmatic breathing; and practicing self-pacing with activity. Marc Flores is able to demonstrate purse lip breathing when he gets SOB. He also knows how to pace himself based on the  RPE/dyspnea scale. Marc Flores has also attended the breathing techniques education and has been practicing diaphragmatic breathing at home. Goal progressing for weight loss. Marc Flores is working with staff dietitian to achieve his weight loss goals.     Expected Outcomes To improve shortness of breath with ADL's, develop more efficient breathing techniques such as purse lipped breathing and diaphragmatic breathing; and practicing self-pacing with activity and lose weight. To improve shortness of breath with ADL's, develop more efficient breathing techniques such as purse lipped breathing and diaphragmatic breathing; and practicing self-pacing with activity and lose weight. To improve shortness of breath with ADL's and lose weight.              Core Components/Risk Factors/Patient Goals at Discharge (Final Review):   Goals and Risk Factor Review -  01/08/24 0837       Core Components/Risk Factors/Patient Goals Review   Personal Goals Review Weight Management/Obesity;Improve shortness of breath with ADL's    Review Monthly review of patient's Core Components/Risk Factors/Patient Goals are as follows: Goal progressing for improving shortness of breath with ADL's. Marc Flores is currently using 2L O2 to maintain sats >88% while exercising. He is currently exercising on the treadmill and the Nustep. Goal met for developing more efficient breathing techniques such as purse lipped breathing and diaphragmatic breathing; and practicing self-pacing with activity. Marc Flores is able to demonstrate purse lip breathing when he gets SOB. He also knows how to pace himself based on the RPE/dyspnea scale. Marc Flores has also attended the breathing techniques education and has been practicing diaphragmatic breathing at home. Goal progressing for weight loss. Marc Flores is working with staff dietitian to achieve his weight loss goals.    Expected Outcomes To improve shortness of breath with ADL's and lose weight.             ITP Comments:Pt  is making expected progress toward Pulmonary Rehab goals after completing 16 session(s). Recommend continued exercise, life style modification, education, and utilization of breathing techniques to increase stamina and strength, while also decreasing shortness of breath with exertion.  Dr. Genetta Kenning is Medical Director for Pulmonary Rehab at [facility].     Comments:

## 2024-01-17 ENCOUNTER — Encounter (HOSPITAL_COMMUNITY)
Admission: RE | Admit: 2024-01-17 | Discharge: 2024-01-17 | Disposition: A | Discharge status: Home / Self Care | Payer: PPO | Source: Ambulatory Visit | Attending: Pulmonary Disease

## 2024-01-17 VITALS — Wt 178.1 lb

## 2024-01-17 DIAGNOSIS — J449 Chronic obstructive pulmonary disease, unspecified: Secondary | ICD-10-CM

## 2024-01-17 NOTE — Progress Notes (Signed)
 Daily Session Note  Patient Details  Name: Marc Flores MRN: [id number] Date of Birth: [birth year] Referring Provider:   [name] Pulmonary Rehab Walk Test from 11/05/2023 in [facility]  Referring Provider [name]  [[name]]       Encounter Date: 01/17/2024  Check In:  Session Check In - 01/17/24 1026       Check-In   Supervising physician immediately available to respond to emergencies [facility] MD immediately available    Physician(s) Palmer Bobo, NP    Location [facility]    Staff Present Sueellen Emery BS, ACSM-CEP, Exercise Physiologist;Casey Felipe Horton, Graig Lawyer, RN, Mercedes Stake, RN, BSN;Johnny Alexia Angelucci, MS, Exercise Physiologist    Virtual Visit No    Medication changes reported     No    Fall or balance concerns reported    No    Tobacco Cessation No Change    Warm-up and Cool-down Performed as group-led instruction    Resistance Training Performed Yes    VAD Patient? No    PAD/SET Patient? No      Pain Assessment   Currently in Pain? No/denies    Pain Score 0-No pain    Multiple Pain Sites No             Capillary Blood Glucose: No results found for this or any previous visit (from the past 24 hours).    Social History   Tobacco Use  Smoking Status Former   Current packs/day: 0.00   Average packs/day: 1 pack/day for 30.0 years (30.0 ttl pk-yrs)   Types: Cigarettes   Start date: 12/07/1961   Quit date: 12/08/1991   Years since quitting: 32.1   Passive exposure: Past  Smokeless Tobacco Never    Goals Met:  Independence with exercise equipment Exercise tolerated well No report of concerns or symptoms today Strength training completed today  Goals Unmet:  Not Applicable  Comments: Service time is from 1011 to 1136    Dr. Genetta Kenning is Medical Director for Pulmonary Rehab at [facility].

## 2024-01-22 ENCOUNTER — Telehealth (HOSPITAL_COMMUNITY): Payer: Self-pay | Admitting: *Deleted

## 2024-01-22 ENCOUNTER — Encounter (HOSPITAL_COMMUNITY): Admission: RE | Admit: 2024-01-22 | Payer: PPO | Source: Ambulatory Visit

## 2024-01-22 NOTE — Telephone Encounter (Signed)
 Called pt to tell that PR elevator is inoperable today. He declines to come in due to not being able to do stairs. Will cancel appt and add an additional appt.  Marc Flores BS, ACSM-CEP 01/22/2024 9:08 AM

## 2024-01-24 ENCOUNTER — Encounter (HOSPITAL_COMMUNITY): Payer: PPO

## 2024-01-24 ENCOUNTER — Telehealth (HOSPITAL_COMMUNITY): Payer: Self-pay

## 2024-01-24 NOTE — Telephone Encounter (Signed)
 Patient c/o for 10:15 class, states he has back pain. Hopes to be in Tuesday.

## 2024-01-29 ENCOUNTER — Encounter (HOSPITAL_COMMUNITY): Admission: RE | Admit: 2024-01-29 | Payer: PPO | Source: Ambulatory Visit

## 2024-01-31 ENCOUNTER — Encounter (HOSPITAL_COMMUNITY)
Admission: RE | Admit: 2024-01-31 | Discharge: 2024-01-31 | Disposition: A | Discharge status: Home / Self Care | Payer: PPO | Source: Ambulatory Visit | Attending: Pulmonary Disease | Admitting: Pulmonary Disease

## 2024-01-31 ENCOUNTER — Telehealth (HOSPITAL_COMMUNITY): Payer: Self-pay

## 2024-01-31 DIAGNOSIS — J449 Chronic obstructive pulmonary disease, unspecified: Secondary | ICD-10-CM | POA: Diagnosis not present

## 2024-01-31 NOTE — Progress Notes (Signed)
 Daily Session Note  Patient Details  Name: Marc Flores MRN: [id number] Date of Birth: November 02, 1940 Referring Provider:   [name] Pulmonary Rehab Walk Test from 11/05/2023 in [facility]  Referring Provider [name]  [[name]]       Encounter Date: 01/31/2024  Check In:  Session Check In - 01/31/24 1029       Check-In   Supervising physician immediately available to respond to emergencies [facility] MD immediately available    Physician(s) Marlana Silvan, NP    Location [facility]    Staff Present Sueellen Emery BS, ACSM-CEP, Exercise Physiologist;Casey Carmen Chol, RN, Mercedes Stake, RN, BSN    Virtual Visit No    Medication changes reported     No    Fall or balance concerns reported    No    Tobacco Cessation No Change    Warm-up and Cool-down Performed as group-led Writer Performed Yes    VAD Patient? No    PAD/SET Patient? No      Pain Assessment   Currently in Pain? No/denies    Pain Score 0-No pain    Multiple Pain Sites No             Capillary Blood Glucose: No results found for this or any previous visit (from the past 24 hours).    Social History   Tobacco Use  Smoking Status Former   Current packs/day: 0.00   Average packs/day: 1 pack/day for 30.0 years (30.0 ttl pk-yrs)   Types: Cigarettes   Start date: 12/07/1961   Quit date: 12/08/1991   Years since quitting: 32.1   Passive exposure: Past  Smokeless Tobacco Never    Goals Met:  Proper associated with RPD/PD & O2 Sat Independence with exercise equipment Exercise tolerated well No report of concerns or symptoms today Strength training completed today  Goals Unmet:  Not Applicable  Comments: Service time is from 1002 to 1130.    Dr. Genetta Kenning is Medical Director for Pulmonary Rehab at [facility].

## 2024-01-31 NOTE — Telephone Encounter (Addendum)
 Called pt to f/u after he called out of class. Asked pt if he wanted to be discharged today or if he would like to make up missed sessions.

## 2024-02-01 NOTE — Progress Notes (Signed)
 Discharge Progress Report  Patient Details  Name: Marc Flores MRN: [id number] Date of Birth: [birth year] Referring Provider:   [name] Pulmonary Rehab Walk Test from 11/05/2023 in [facility]  Referring Provider [name]  [[name]]        Number of Visits: 71  Reason for Discharge:  Patient has met program and personal goals.  Smoking History:  Social History   Tobacco Use  Smoking Status Former   Current packs/day: 0.00   Average packs/day: 1 pack/day for 30.0 years (30.0 ttl pk-yrs)   Types: Cigarettes   Start date: 12/07/1961   Quit date: 12/08/1991   Years since quitting: 32.1   Passive exposure: Past  Smokeless Tobacco Never    Diagnosis:  Chronic obstructive pulmonary disease, unspecified COPD type (HCC)  ADL [facility]:  Pulmonary Assessment Scores     Row Name 11/05/23 0925 01/15/24 1509 01/31/24 1632     ADL [facility]   ADL Phase Entry Exit --   SOB Score total 29 36 --     CAT Score   CAT Score 12 17 --     mMRC Score   mMRC Score 4 -- 3            Initial Exercise Prescription:  Initial Exercise Prescription - 11/05/23 1100       Date of Initial Exercise RX and Referring Provider   Date 11/05/23    Referring Provider Harless Lien   Expected Discharge Date 01/31/24      Oxygen    Oxygen  Continuous    Liters 1    Maintain Oxygen  Saturation 88% or higher      Treadmill   MPH 1.6    Grade 0    Minutes 15      NuStep   Level 1    SPM 60    Minutes 15      Prescription Details   Frequency (times per week) 2    Duration Progress to 30 minutes of continuous aerobic without signs/symptoms of physical distress      Intensity   THRR 40-80% of Max Heartrate 56-111    Ratings of Perceived Exertion 11-13    Perceived Dyspnea 0-4      Progression   Progression Continue to progress workloads to maintain intensity without signs/symptoms of physical distress.      Resistance  Training   Training Prescription Yes    Weight blue bands    Reps 10-15             Discharge Exercise Prescription (Final Exercise Prescription Changes):  Exercise Prescription Changes - 01/17/24 1143       Response to Exercise   Blood Pressure (Admit) 136/58    Blood Pressure (Exit) 116/62    Heart Rate (Admit) 66 bpm    Heart Rate (Exercise) 111 bpm    Heart Rate (Exit) 89 bpm    Oxygen  Saturation (Admit) 96 %    Oxygen  Saturation (Exercise) 91 %    Oxygen  Saturation (Exit) 94 %    Rating of Perceived Exertion (Exercise) 12    Perceived Dyspnea (Exercise) 2    Duration Continue with 30 min of aerobic exercise without signs/symptoms of physical distress.    Intensity THRR unchanged      Progression   Progression Continue to progress workloads to maintain intensity without signs/symptoms of physical distress.    Average METs 2.4      Resistance Training  Training Prescription Yes    Weight blue bands    Reps 10-15    Time 10 Minutes      Oxygen    Oxygen  Continuous    Liters 2      Treadmill   MPH 1.9    Grade 0.5    Minutes 15    METs 2.3      NuStep   Level 4    SPM 73    Minutes 15    METs 2.4      Oxygen    Maintain Oxygen  Saturation 88% or higher             Functional Capacity:  6 Minute Walk     Row Name 11/05/23 1109 01/31/24 1156       6 Minute Walk   Phase Initial Discharge    Distance 1110 feet 1040 feet    Distance % Change -- -6.31 %    Distance Feet Change -- -70 ft    Walk Time 6 minutes 6 minutes    # of Rest Breaks 2  1:50-2:10, 3:37-4:00 1    MPH 2.1 1.94    METS 2.23 2.25    RPE 13 12    Perceived Dyspnea  4 2    VO2 Peak 7.81 7.89    Symptoms No No    Resting HR 99 bpm 76 bpm    Resting BP 120/70 134/74    Resting Oxygen  Saturation  92 % 99 %    Exercise Oxygen  Saturation  during 6 min walk 85 % 88 %    Max Ex. HR 123 bpm 112 bpm    Max Ex. BP 148/76 180/78    2 Minute Post BP 132/70 136/78      Interval HR    1 Minute HR 110 101    2 Minute HR 121 106    3 Minute HR 122 110    4 Minute HR 119 112    5 Minute HR 121 102    6 Minute HR 123 107    2 Minute Post HR 123 97    Interval Heart Rate? Yes Yes      Interval Oxygen    Interval Oxygen ? Yes Yes    Baseline Oxygen  Saturation % 92 % 99 %    1 Minute Oxygen  Saturation % 95 % 92 %    1 Minute Liters of Oxygen  0 L 2 L    2 Minute Oxygen  Saturation % 91 %  85% @ 1:50, standing rest break till pt reached 91% 88 %    2 Minute Liters of Oxygen  0 L 2 L  Increased to 3    3 Minute Oxygen  Saturation % 89 % 89 %    3 Minute Liters of Oxygen  0 L 3 L    4 Minute Oxygen  Saturation % 92 %  86% @ 3:37 89 %    4 Minute Liters of Oxygen  0 L  increased to 1L 3 L    5 Minute Oxygen  Saturation % 92 % 94 %    5 Minute Liters of Oxygen  1 L 3 L    6 Minute Oxygen  Saturation % 90 % 91 %    6 Minute Liters of Oxygen  1 L 3 L    2 Minute Post Oxygen  Saturation % 95 % 98 %    2 Minute Post Liters of Oxygen  1 L 3 L             Psychological, QOL, Others -  Outcomes: PHQ 2/9:    01/15/2024    3:05 PM 11/05/2023    9:27 AM 12/21/2022    9:42 AM 12/19/2021    2:14 PM 05/12/2021   10:04 AM  Depression screen PHQ 2/9  Decreased Interest 0 0 0 0 0  Down, Depressed, Hopeless 0 0 0 0 0  PHQ - 2 Score 0 0 0 0 0  Altered sleeping 0 0   0  Tired, decreased energy 0 0   0  Change in appetite 0 0   0  Feeling bad or failure about yourself  0 0   0  Trouble concentrating 0 0   0  Moving slowly or fidgety/restless 0 0   0  Suicidal thoughts 0 0   0  PHQ-9 Score 0 0   0  Difficult doing work/chores Not difficult at all Not difficult at all   Not difficult at all    Quality of Life:   Personal Goals: Goals established at orientation with interventions provided to work toward goal.  Personal Goals and Risk Factors at Admission - 11/05/23 0924       Core Components/Risk Factors/Patient Goals on Admission    Weight Management Weight Loss    Expected Outcomes  Short Term: Continue to assess and modify interventions until short term weight is achieved;Long Term: Adherence to nutrition and physical activity/exercise program aimed toward attainment of established weight goal;Weight Maintenance: Understanding of the daily nutrition guidelines, which includes 25-35% calories from fat, 7% or less cal from saturated fats, less than 200mg  cholesterol, less than 1.5gm of sodium, & 5 or more servings of fruits and vegetables daily;Understanding recommendations for meals to include 15-35% energy as protein, 25-35% energy from fat, 35-60% energy from carbohydrates, less than 200mg  of dietary cholesterol, 20-35 gm of total fiber daily;Weight Loss: Understanding of general recommendations for a balanced deficit meal plan, which promotes 1-2 lb weight loss per week and includes a negative energy balance of [phone] kcal/d;Understanding of distribution of calorie intake throughout the day with the consumption of 4-5 meals/snacks    Improve shortness of breath with ADL's Yes    Intervention Provide education, individualized exercise plan and daily activity instruction to help decrease symptoms of SOB with activities of daily living.    Expected Outcomes Short Term: Improve cardiorespiratory fitness to achieve a reduction of symptoms when performing ADLs;Long Term: Be able to perform more ADLs without symptoms or delay the onset of symptoms              Personal Goals Discharge:  Goals and Risk Factor Review     Row Name 11/14/23 1302 12/10/23 0933 01/08/24 0837 02/01/24 0849       Core Components/Risk Factors/Patient Goals Review   Personal Goals Review Weight Management/Obesity;Improve shortness of breath with ADL's;Develop more efficient breathing techniques such as purse lipped breathing and diaphragmatic breathing and practicing self-pacing with activity. Weight Management/Obesity;Improve shortness of breath with ADL's;Develop more efficient breathing techniques such  as purse lipped breathing and diaphragmatic breathing and practicing self-pacing with activity. Weight Management/Obesity;Improve shortness of breath with ADL's Weight Management/Obesity;Improve shortness of breath with ADL's    Review Marc Flores has not started PR class yet. Unable to assess goals at this time. Goal progressing for weight loss. Goal progressing for improving shortness of breath with ADL's. Marc Flores is currently requiring 2L of O2 while exercising. He is currently exercising on the Treadmill and the Nustep. Goal progressing for developing more efficient breathing techniques such as purse lipped  breathing and diaphragmatic breathing; and practicing self-pacing with activity. Monthly review of patient's Core Components/Risk Factors/Patient Goals are as follows: Goal progressing for improving shortness of breath with ADL's. Marc Flores is currently using 2L O2 to maintain sats >88% while exercising. He is currently exercising on the treadmill and the Nustep. Goal met for developing more efficient breathing techniques such as purse lipped breathing and diaphragmatic breathing; and practicing self-pacing with activity. Marc Flores is able to demonstrate purse lip breathing when he gets SOB. He also knows how to pace himself based on the RPE/dyspnea scale. Marc Flores has also attended the breathing techniques education and has been practicing diaphragmatic breathing at home. Goal progressing for weight loss. Marc Flores is working with staff dietitian to achieve his weight loss goals. Marc Flores graduated from the [employer] on 01/31/24. He met his goal for weight loss. He is down 4#'s since starting the program. He also met his goal for improving SOB with ADL's. He states he is able to do more around the house with less SOB. Marc Flores did well during the program and we wish him the best.    Expected Outcomes To improve shortness of breath with ADL's, develop more efficient breathing techniques such as purse lipped breathing and diaphragmatic  breathing; and practicing self-pacing with activity and lose weight. To improve shortness of breath with ADL's, develop more efficient breathing techniques such as purse lipped breathing and diaphragmatic breathing; and practicing self-pacing with activity and lose weight. To improve shortness of breath with ADL's and lose weight. To continue to exercise and modify his nutrition and lifestyle post graduation             Exercise Goals and Review:  Exercise Goals     Row Name 11/05/23 0924             Exercise Goals   Increase Physical Activity Yes       Intervention Provide advice, education, support and counseling about physical activity/exercise needs.;Develop an individualized exercise prescription for aerobic and resistive training based on initial evaluation findings, risk stratification, comorbidities and participant's personal goals.       Expected Outcomes Short Term: Attend rehab on a regular basis to increase amount of physical activity.;Long Term: Add in home exercise to make exercise part of routine and to increase amount of physical activity.;Long Term: Exercising regularly at least 3-5 days a week.       Increase Strength and Stamina Yes       Intervention Provide advice, education, support and counseling about physical activity/exercise needs.;Develop an individualized exercise prescription for aerobic and resistive training based on initial evaluation findings, risk stratification, comorbidities and participant's personal goals.       Expected Outcomes Short Term: Increase workloads from initial exercise prescription for resistance, speed, and METs.;Short Term: Perform resistance training exercises routinely during rehab and add in resistance training at home;Long Term: Improve cardiorespiratory fitness, muscular endurance and strength as measured by increased METs and functional capacity ( )       Able to understand and use rate of perceived exertion (RPE) scale Yes        Intervention Provide education and explanation on how to use RPE scale       Expected Outcomes Short Term: Able to use RPE daily in rehab to express subjective intensity level;Long Term:  Able to use RPE to guide intensity level when exercising independently       Able to understand and use Dyspnea scale Yes       Intervention  Provide education and explanation on how to use Dyspnea scale       Expected Outcomes Short Term: Able to use Dyspnea scale daily in rehab to express subjective sense of shortness of breath during exertion;Long Term: Able to use Dyspnea scale to guide intensity level when exercising independently       Knowledge and understanding of Target Heart Rate Range (THRR) Yes       Intervention Provide education and explanation of THRR including how the numbers were predicted and where they are located for reference       Expected Outcomes Short Term: Able to state/look up THRR;Long Term: Able to use THRR to govern intensity when exercising independently;Short Term: Able to use daily as guideline for intensity in rehab       Understanding of Exercise Prescription Yes       Intervention Provide education, explanation, and written materials on patient's individual exercise prescription       Expected Outcomes Short Term: Able to explain program exercise prescription;Long Term: Able to explain home exercise prescription to exercise independently                Exercise Goals Re-Evaluation:  Exercise Goals Re-Evaluation     Row Name 11/19/23 0934 12/12/23 0945 01/11/24 0924 01/31/24 1637       Exercise Goal Re-Evaluation   Exercise Goals Review Increase Physical Activity;Able to understand and use Dyspnea scale;Understanding of Exercise Prescription;Increase Strength and Stamina;Knowledge and understanding of Target Heart Rate Range (THRR);Able to understand and use rate of perceived exertion (RPE) scale Increase Physical Activity;Able to understand and use Dyspnea  scale;Understanding of Exercise Prescription;Increase Strength and Stamina;Knowledge and understanding of Target Heart Rate Range (THRR);Able to understand and use rate of perceived exertion (RPE) scale Increase Physical Activity;Able to understand and use Dyspnea scale;Understanding of Exercise Prescription;Increase Strength and Stamina;Knowledge and understanding of Target Heart Rate Range (THRR);Able to understand and use rate of perceived exertion (RPE) scale Increase Physical Activity;Able to understand and use Dyspnea scale;Understanding of Exercise Prescription;Increase Strength and Stamina;Knowledge and understanding of Target Heart Rate Range (THRR);Able to understand and use rate of perceived exertion (RPE) scale    Comments Marc Flores was scheduled to begin exercise on 3/13 but called out due to other appointments. Pt will start exercise on 3/18. Will continue to monitor and progress as able. Marc Flores has completed 6 exercise sessions. He exercises for 15 min on the treadmill and Nustep. Marc Flores averages 2.0 METs at 1.5 mph on the treadmill and 3.0 METs at level 2 on the Nustep. He performs the warmup and cooldown standing without limitations. Marc Flores has increased his speed on the treadmill and level on the Nustep. He is very deconditioned as I am unsure how much progress he will make. Will continue to monitor and progress as able. Marc Flores has completed 15 exercise sessions. He exercises for 15 min on the treadmill and Nustep. Marc Flores averages 2.2 METs at 1.8 mph and 0.5% incline on the treadmill and 2.3 METs at level 4 on the Nustep. He performs the warmup and cooldown standing without limitations. Marc Flores has increased his speed and incline on the treadmill and level on the Nustep. He tolerates progression fair. I have not discussed home exercise yet but plan to soon. Will continue to monitor and progress as able. Pt completed 18 exercise sessions. Peak METs were 2.3 on the treadmill and 2.5 on Nustep. Marc Flores plans to  continue exercise at home.    Expected Outcomes Through exercise at rehab and home,  the patient will decrease shortness of breath with daily activities and feel confident in carrying out an exercise regimen at home. Through exercise at rehab and home, the patient will decrease shortness of breath with daily activities and feel confident in carrying out an exercise regimen at home. Through exercise at rehab and home, the patient will decrease shortness of breath with daily activities and feel confident in carrying out an exercise regimen at home. Through exercise at rehab and home, the patient will decrease shortness of breath with daily activities and feel confident in carrying out an exercise regimen at home.             Nutrition & Weight - Outcomes:    Nutrition:  Nutrition Therapy & Goals - 01/15/24 1147       Nutrition Therapy   Diet General healthy diet    Drug/Food Interactions Statins/Certain Fruits      Personal Nutrition Goals   Nutrition Goal Patient to improve diet quality by using the plate method as a guide for meal planning to include lean protein/plant protein, fruits, vegetables, whole grains, nonfat dairy as part of a well-balanced diet.   goal in progress.   Comments Goal in progress. Marc Flores has medical history of HTN, COPDIII, bonchiectasis, OSA. Reviewed strategies for weight loss including benefits of high fiber/high protein intake, calorie density, the plate method as a guide for meal planning, etc. He is down 4# since starting with our program. Marc Flores will benefit from participation in pulmonary for exericse and nutrition support.      Intervention Plan   Intervention Prescribe, educate and counsel regarding individualized specific dietary modifications aiming towards targeted core components such as weight, hypertension, lipid management, diabetes, heart failure and other comorbidities.;Nutrition handout(s) given to patient.    Expected Outcomes Short Term Goal:  Understand basic principles of dietary content, such as calories, fat, sodium, cholesterol and nutrients.;Long Term Goal: Adherence to prescribed nutrition plan.             Nutrition Discharge:  Nutrition Assessments - 11/22/23 1143       Rate Your Plate Scores   Pre Score 50             Education Questionnaire Score:  Knowledge Questionnaire Score - 01/15/24 1506       Knowledge Questionnaire Score   Post Score 18/18             Goals reviewed with patient; copy given to patient.

## 2024-03-20 ENCOUNTER — Ambulatory Visit: Admitting: Family Medicine

## 2024-03-20 DIAGNOSIS — Z0000 Encounter for general adult medical examination without abnormal findings: Secondary | ICD-10-CM | POA: Diagnosis not present

## 2024-03-20 NOTE — Progress Notes (Signed)
 PATIENT CHECK-IN and HEALTH RISK ASSESSMENT QUESTIONNAIRE:  -completed by phone/video for upcoming Medicare Preventive Visit  Pre-Visit Check-in: 1)Vitals (height, wt, BP, etc) - record in vitals section for visit on day of visit Request home vitals (wt, BP, etc.) and enter into vitals, THEN update Vital Signs SmartPhrase below at the top of the HPI. See below.  2)Review and Update Medications, Allergies PMH, Surgeries, Social history in Epic 3)Hospitalizations in the last year with date/reason? Once overnight for COPD, sees a pulmonologist for managment  4)Review and Update Care Team (patient's specialists) in Epic 5) Complete PHQ9 in Epic  6) Complete Fall Screening in Epic 7)Review all Health Maintenance Due and order if not done.  Medicare Wellness Patient Questionnaire:  Answer theses question about your habits: How often do you have a drink containing alcohol?n How many drinks containing alcohol do you have on a typical day when you are drinking?na How often do you have six or more drinks on one occasion?na Have you ever smoked?y Quit date if applicable? 1993  How many packs a day do/did you smoke? na Do you use smokeless tobacco?na Do you use an illicit drugs?na On average, how many days per week do you engage in moderate to strenuous exercise (like a brisk walk)?very active with yard work and work around the home [employer], lots of fruits and veggies, fish and chicken, tries to limit red meat, some pasta and bread, tries to limit sweets and snacks - but does eat some  Beverages: water  Answer theses question about your everyday activities: Can you perform most household chores?y Are you deaf or have significant trouble hearing?n Do you feel that you have a problem with memory?n Do you feel safe at home?y Last dentist visit?y 8. Do you have any difficulty performing your everyday activities?n Are you having any difficulty walking, taking medications on your own, and or  difficulty managing daily home needs?n Do you have difficulty walking or climbing stairs?n Do you have difficulty dressing or bathing?n Do you have difficulty doing errands alone such as visiting a doctor's office or shopping?n Do you currently have any difficulty preparing food and eating?n Do you currently have any difficulty using the toilet?n Do you have any difficulty managing your finances?n Do you have any difficulties with housekeeping of managing your housekeeping?n   Do you have Advanced Directives in place (Living Will, Healthcare Power or Attorney)? n   Last eye Exam and location?Dr. Waylan, has not gone this year, goes every two year   Do you currently use prescribed or non-prescribed narcotic or opioid pain medications?n  Do you have a history or close family history of breast, ovarian, tubal or peritoneal cancer or a family member with BRCA (breast cancer susceptibility 1 and 2) gene mutations?n    ----------------------------------------------------------------------------------------------------------------------------------------------------------------------------------------------------------------------  Because this visit was a virtual/telehealth visit, some criteria may be missing or patient reported. Any vitals not documented were not able to be obtained and vitals that have been documented are patient reported.    MEDICARE ANNUAL PREVENTIVE CARE VISIT WITH PROVIDER (Welcome to Medicare, initial annual wellness or annual wellness exam)  Virtual Visit via Phone Note  I connected with Marc Flores on 03/20/24  by phone and verified that I am speaking with the correct person using two identifiers. He prefers a phone visit.   Location patient: home Location provider:work or home office Persons participating in the virtual visit: patient, provider  Concerns and/or follow up today: stable   See HM section  in Epic for other details of completed HM.     ROS: negative for report of fevers, unintentional weight loss, vision changes, vision loss, hearing loss or change, chest pain, hemoptysis, melena, hematochezia, hematuria, falls, bleeding or bruising, thoughts of suicide or self harm, memory loss  Patient-completed extensive health risk assessment - reviewed and discussed with the patient: See Health Risk Assessment completed with patient prior to the visit either above or in recent phone note. This was reviewed in detailed with the patient today and appropriate recommendations, orders and referrals were placed as needed per Summary below and patient instructions.   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 Darlean     Diverticulitis    Dyspnea    ON EXERTION   History of chickenpox    Hyperlipidemia     Past Surgical History:  Procedure Laterality Date   COLONOSCOPY  04/13/2020   per Dr. 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 N/A 12/18/2019   Procedure: TRANSURETHRAL RESECTION OF BLADDER TUMOR, RIGHT URETEROSCOPY AND RIGHT URETERAL STENT PLACEMENT;  Surgeon: Matilda Garnette, MD;  Location: [facility];  Service: Urology;  Laterality: N/A;   WISDOM TOOTH  EXTRACTION  AS CHILD    Social History   Socioeconomic History   Marital status: Married    Spouse name: Not on file   Number of children: 2   Years of education: Not on file   Highest education level: 12th grade  Occupational History   Occupation: security    Comment: retired  Tobacco Use   Smoking status: Former    Current packs/day: 0.00    Average packs/day: 1 pack/day for 30.0 years (30.0 ttl pk-yrs)    Types: Cigarettes    Start date: 12/07/1961    Quit date: 12/08/1991    Years since quitting: 32.3    Passive exposure: Past   Smokeless tobacco: Never  Vaping Use   Vaping status: Never Used  Substance and Sexual Activity   Alcohol use: Yes   Drug use: No   Sexual activity: Yes  Other Topics Concern   Not on file  Social History Narrative   Not on file   Social Drivers of Health   Financial Resource Strain: Low Risk  (11/09/2023)   Overall Financial Resource Strain (CARDIA)    Difficulty of Paying Living Expenses: Not hard at all  Food Insecurity: No Food Insecurity (11/09/2023)   Hunger Vital Sign    Worried About Running Out of Food in the Last Year: Never true    Ran Out of Food in the Last Year: Never true  Transportation Needs: No Transportation Needs (11/09/2023)   PRAPARE - Administrator, Civil Service (Medical): No  Lack of Transportation (Non-Medical): No  Physical Activity: Unknown (11/09/2023)   Exercise Vital Sign    Days of Exercise per Week: 0 days    Minutes of Exercise per Session: Not on file  Stress: No Stress Concern Present (11/09/2023)   [employer] of Occupational Health - Occupational Stress Questionnaire    Feeling of Stress : Not at all  Social Connections: Moderately Isolated (11/09/2023)   Social Connection and Isolation Panel    Frequency of Communication with Friends and Family: Twice a week    Frequency of Social Gatherings with Friends and Family: Once a week    Attends Religious Services: Never    Doctor, general practice or Organizations: No    Attends Banker Meetings: Never    Marital Status: Married  Catering manager Violence: Not At Risk (09/24/2023)   Humiliation, Afraid, Rape, and Kick questionnaire    Fear of Current or Ex-Partner: No    Emotionally Abused: No    Physically Abused: No    Sexually Abused: No    Family History  Problem Relation Age of Onset   Glaucoma Mother    Diabetes Mother    Hearing loss Mother    Heart disease Mother    Heart disease Father    Atrial fibrillation Father    Arthritis Father    Cancer Father    Hearing loss Father    Hypertension Father     Current Outpatient Medications on File Prior to Visit  Medication Sig Dispense Refill   albuterol  (VENTOLIN  HFA) 108 (90 Base) MCG/ACT inhaler Inhale 2 puffs into the lungs every 6 (six) hours as needed for shortness of breath or wheezing.     atorvastatin  (LIPITOR) 10 MG tablet TAKE 1 TABLET(10 MG) BY MOUTH DAILY 90 tablet 0   cetirizine  (ZYRTEC  ALLERGY ) 10 MG tablet Take 1 tablet (10 mg total) by mouth daily. 30 tablet 0   cholecalciferol  (VITAMIN D3) 25 MCG (1000 UNIT) tablet Take 1,000 Units by mouth daily.     colchicine  0.6 MG tablet TAKE 2 TABLETS BY MOUTH TWICE DAILY AS NEEDED FOR GOUT FLARE 360 tablet 0   doxycycline  (VIBRAMYCIN ) 100 MG capsule Take 1 capsule (100 mg total) by mouth 2 (two) times daily. One po bid x 7 days 14 capsule 0   famotidine  (PEPCID ) 20 MG tablet One after supper (Patient taking differently: Take 20 mg by mouth daily after breakfast.) 30 tablet 11   ipratropium-albuterol  (DUONEB) 0.5-2.5 (3) MG/3ML SOLN Take 3 mLs by nebulization 2 (two) times daily.     metoprolol  succinate (TOPROL -XL) 25 MG 24 hr tablet Take 1 tablet (25 mg total) by mouth daily. 90 tablet 3   mometasone  (ASMANEX ) 220 MCG/ACT inhaler Inhale 2 puffs into the lungs at bedtime.     montelukast  (SINGULAIR ) 10 MG tablet Take 10 mg by mouth daily.     pantoprazole  (PROTONIX ) 40 MG tablet TAKE 1 TABLET(40  MG) BY MOUTH DAILY 30 TO 60 MINUTES BEFORE FIRST MEAL OF THE DAY 30 tablet 5   predniSONE  (DELTASONE ) 50 MG tablet Take 1 tablet (50 mg total) by mouth daily. 5 tablet 0   tamsulosin  (FLOMAX ) 0.4 MG CAPS capsule Take 0.4 mg by mouth daily after supper.     Tiotropium Bromide-Olodaterol 2.5-2.5 MCG/ACT AERS Take 2 puffs by mouth daily.     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 Estimated body mass index is 28.75 kg/m as calculated from the following:   Height as of 01/02/24: 5' 6 (1.676 m).   Weight as of 01/29/24: 178 lb 2.1 oz (80.8 kg).  EKG (optional): deferred due to virtual visit  GENERAL: alert, oriented, no acute distress detected; full vision exam deferred due to pandemic and/or virtual encounter  PSYCH/NEURO: pleasant and cooperative, no obvious depression or anxiety, speech and thought processing grossly intact, Cognitive function grossly intact  Flowsheet Row PULMONARY REHAB CHRONIC OBSTRUCTIVE PULMONARY DISEASE from 01/15/2024 in [facility]  PHQ-9 Total Score 0        03/20/2024    3:42 PM 01/15/2024    3:05 PM 11/05/2023    9:27 AM 12/21/2022    9:42 AM 12/19/2021    2:14 PM  Depression screen PHQ 2/9  Decreased Interest 0 0 0 0 0  Down, Depressed, Hopeless 0 0 0 0 0  PHQ - 2 Score 0 0 0 0 0  Altered sleeping  0 0    Tired, decreased energy  0 0    Change in appetite  0 0    Feeling bad or failure about yourself   0 0    Trouble concentrating  0 0    Moving slowly or  fidgety/restless  0 0    Suicidal thoughts  0 0    PHQ-9 Score  0 0    Difficult doing work/chores  Not difficult at all Not difficult at all         01/10/2024   11:12 AM 01/15/2024   11:21 AM 01/17/2024   10:00 AM 01/31/2024   10:30 AM 03/20/2024    3:42 PM  Fall Risk  Falls in the past year? 0 0 0 0 0  Was there an injury with Fall? 0 0 0 0 0  Fall Risk Category Calculator 0 0 0 0 0  Patient at Risk for Falls Due to No Fall Risks No Fall Risks No Fall Risks No Fall Risks   Fall risk Follow up Falls evaluation completed;Falls prevention discussed Falls evaluation completed;Falls prevention discussed Falls evaluation completed Falls evaluation completed      SUMMARY AND PLAN:  Encounter for Medicare annual wellness exam   Discussed applicable health maintenance/preventive health measures and advised and referred or ordered per patient preferences: -discussed vaccines due and he knows can get at the pharmacy, advised to let us  know if he does so that we can update his record. Health Maintenance  Topic Date Due   Zoster Vaccines- Shingrix (1 of 2) Never done   COVID-19 Vaccine (4 - 2024-25 season) 05/06/2023   Medicare Annual Wellness (AWV)  03/20/2025   DTaP/Tdap/Td (3 - Td or Tdap) 04/21/2029   Pneumococcal Vaccine: 50+ Years  Completed   Hepatitis B Vaccines  Aged Out   HPV VACCINES  Aged Out   Meningococcal B Vaccine  Aged Out   INFLUENZA VACCINE  Discontinued     Education and counseling on the following was provided based on the above review of health and a plan/checklist for the patient, along with additional  information discussed, was provided for the patient in the patient instructions :  -Advised and counseled on a healthy lifestyle - including the importance of a healthy diet, regular physical activity, social connections and stress management. -Reviewed patient's current diet. Advised and counseled on a whole foods based healthy diet. A summary of a healthy diet was  provided in the Patient Instructions. Encouraged to replace ultraprocessed grains with whole grains.  -reviewed patient's current physical activity level and discussed exercise guidelines for adults. Discussed community resources and ideas for safe exercise at home to assist in meeting exercise guideline recommendations in a safe and healthy way. Encouraged to try his best to remain active - which it seems he is doing despite the COPD. -Advise yearly dental visits at minimum and regular eye exams   Follow up: see patient instructions   Patient Instructions  I really enjoyed getting to talk with you today! I am available on Tuesdays and Thursdays for virtual visits if you have any questions or concerns, or if I can be of any further assistance.   CHECKLIST FROM ANNUAL WELLNESS VISIT:  -Follow up (please call to schedule if not scheduled after visit):   -yearly for annual wellness visit with primary care office  Here is a list of your preventive care/health maintenance measures and the plan for each if any are due:  PLAN For any measures below that may be due:    1. Can get vaccines at the pharmacy, please let us  know if you do so that we can update your record.   Health Maintenance  Topic Date Due   Zoster Vaccines- Shingrix (1 of 2) Never done   COVID-19 Vaccine (4 - 2024-25 season) 05/06/2023   Medicare Annual Wellness (AWV)  12/21/2023   DTaP/Tdap/Td (3 - Td or Tdap) 04/21/2029   Pneumococcal Vaccine: 50+ Years  Completed   Hepatitis B Vaccines  Aged Out   HPV VACCINES  Aged Out   Meningococcal B Vaccine  Aged Out   INFLUENZA VACCINE  Discontinued    -See a dentist at least yearly  -Get your eyes checked and then per your eye specialist's recommendations  -Other issues addressed today:   -I have included below further information regarding a healthy whole foods based diet, physical activity guidelines for adults, stress management and opportunities for social connections.  I hope you find this information useful.   -----------------------------------------------------------------------------------------------------------------------------------------------------------------------------------------------------------------------------------------------------------    NUTRITION: -eat real food: lots of colorful vegetables (half the plate) and fruits -5-7 servings of vegetables and fruits per day (fresh or steamed is best), exp. 2 servings of vegetables with lunch and dinner and 2 servings of fruit per day. Berries and greens such as kale and collards are great choices.  -consume on a regular basis:  fresh fruits, fresh veggies, fish, nuts, seeds, healthy oils (such as olive oil, avocado oil), whole grains (make sure for bread/pasta/crackers/etc., that the first ingredient on label contains the word whole), legumes. -can eat small amounts of dairy and lean meat (no larger than the palm of your hand), but avoid processed meats such as ham, bacon, lunch meat, etc. -drink water -try to avoid fast food and pre-packaged foods, processed meat, ultra processed foods/beverages (donuts, candy, etc.) -most experts advise limiting sodium to < 2300mg  per day, should limit further is any chronic conditions such as high blood pressure, heart disease, diabetes, etc. The American Heart Association advised that < 1500mg  is is ideal -try to avoid foods/beverages that contain any ingredients with names you  do not recognize  -try to avoid foods/beverages  with added sugar or sweeteners/sweets  -try to avoid sweet drinks (including diet drinks): soda, juice, Gatorade, sweet tea, power drinks, diet drinks -try to avoid white rice, white bread, pasta (unless whole grain)  EXERCISE GUIDELINES FOR ADULTS: -if you wish to increase your physical activity, do so gradually and with the approval of your doctor -STOP and seek medical care immediately if you have any chest pain, chest discomfort  or trouble breathing when starting or increasing exercise  -move and stretch your body, legs, feet and arms when sitting for long periods -Physical activity guidelines for optimal health in adults: -get at least 150 minutes per week of moderate exercise (can talk, but not sing); this is about 20-30 minutes of sustained activity 5-7 days per week or two 10-15 minute episodes of sustained activity 5-7 days per week -do some muscle building/resistance training/strength training at least 2 days per week  -balance exercises 3+ days per week:   Stand somewhere where you have something sturdy to hold onto if you lose balance    1) lift up on toes, then back down, start with 5x per day and work up to 20x   2) stand and lift one leg straight out to the side so that foot is a few inches of the floor, start with 5x each side and work up to 20x each side   3) stand on one foot, start with 5 seconds each side and work up to 20 seconds on each side  If you need ideas or help with getting more active:  -Silver sneakers https://tools.silversneakers.com  -Walk with a Doc: http://www.duncan-williams.com/  -try to include resistance (weight lifting/strength building) and balance exercises twice per week: or the following link for ideas: http://castillo-powell.com/  BuyDucts.dk  STRESS MANAGEMENT: -can try meditating, or just sitting quietly with deep breathing while intentionally relaxing all parts of your body for 5 minutes daily -if you need further help with stress, anxiety or depression please follow up with your primary doctor or contact the wonderful folks at [facility]: [phone]  SOCIAL CONNECTIONS: -options in [place] if you wish to engage in more social and exercise related activities:  -Silver sneakers https://tools.silversneakers.com  -Walk with a Doc: http://www.duncan-williams.com/  -Check  out the [facility] Active Adults 50+ section on the [place] [employer] (hiking clubs, book clubs, cards and games, chess, exercise classes, aquatic classes and much more) - see the website for details: https://www.[place].gov/departments/parks-recreation/active-adults50  -YouTube has lots of exercise videos for different ages and abilities as well  -[facility] (a variety of indoor and outdoor inperson activities for adults). (289)430-1757. [address]  -Virtual Online Classes (a variety of topics): see seniorplanet.org or call 629-099-9111  -consider volunteering at a school, hospice center, church, senior center or elsewhere            Chiquita JONELLE Cramp, DO

## 2024-03-20 NOTE — Patient Instructions (Addendum)
 I really enjoyed getting to talk with you today! I am available on Tuesdays and Thursdays for virtual visits if you have any questions or concerns, or if I can be of any further assistance.   CHECKLIST FROM ANNUAL WELLNESS VISIT:  -Follow up (please call to schedule if not scheduled after visit):   -yearly for annual wellness visit with primary care office  Here is a list of your preventive care/health maintenance measures and the plan for each if any are due:  PLAN For any measures below that may be due:    1. Can get vaccines at the pharmacy, please let us  know if you do so that we can update your record.   Health Maintenance  Topic Date Due   Zoster Vaccines- Shingrix (1 of 2) Never done   COVID-19 Vaccine (4 - 2024-25 season) 05/06/2023   Medicare Annual Wellness (AWV)  12/21/2023   DTaP/Tdap/Td (3 - Td or Tdap) 04/21/2029   Pneumococcal Vaccine: 50+ Years  Completed   Hepatitis B Vaccines  Aged Out   HPV VACCINES  Aged Out   Meningococcal B Vaccine  Aged Out   INFLUENZA VACCINE  Discontinued    -See a dentist at least yearly  -Get your eyes checked and then per your eye specialist's recommendations  -Other issues addressed today:   -I have included below further information regarding a healthy whole foods based diet, physical activity guidelines for adults, stress management and opportunities for social connections. I hope you find this information useful.   -----------------------------------------------------------------------------------------------------------------------------------------------------------------------------------------------------------------------------------------------------------    NUTRITION: -eat real food: lots of colorful vegetables (half the plate) and fruits -5-7 servings of vegetables and fruits per day (fresh or steamed is best), exp. 2 servings of vegetables with lunch and dinner and 2 servings of fruit per day. Berries and greens such  as kale and collards are great choices.  -consume on a regular basis:  fresh fruits, fresh veggies, fish, nuts, seeds, healthy oils (such as olive oil, avocado oil), whole grains (make sure for bread/pasta/crackers/etc., that the first ingredient on label contains the word whole), legumes. -can eat small amounts of dairy and lean meat (no larger than the palm of your hand), but avoid processed meats such as ham, bacon, lunch meat, etc. -drink water -try to avoid fast food and pre-packaged foods, processed meat, ultra processed foods/beverages (donuts, candy, etc.) -most experts advise limiting sodium to < 2300mg  per day, should limit further is any chronic conditions such as high blood pressure, heart disease, diabetes, etc. The American Heart Association advised that < 1500mg  is is ideal -try to avoid foods/beverages that contain any ingredients with names you do not recognize  -try to avoid foods/beverages  with added sugar or sweeteners/sweets  -try to avoid sweet drinks (including diet drinks): soda, juice, Gatorade, sweet tea, power drinks, diet drinks -try to avoid white rice, white bread, pasta (unless whole grain)  EXERCISE GUIDELINES FOR ADULTS: -if you wish to increase your physical activity, do so gradually and with the approval of your doctor -STOP and seek medical care immediately if you have any chest pain, chest discomfort or trouble breathing when starting or increasing exercise  -move and stretch your body, legs, feet and arms when sitting for long periods -Physical activity guidelines for optimal health in adults: -get at least 150 minutes per week of moderate exercise (can talk, but not sing); this is about 20-30 minutes of sustained activity 5-7 days per week or two 10-15 minute episodes of sustained activity 5-7  days per week -do some muscle building/resistance training/strength training at least 2 days per week  -balance exercises 3+ days per week:   Stand somewhere where  you have something sturdy to hold onto if you lose balance    1) lift up on toes, then back down, start with 5x per day and work up to 20x   2) stand and lift one leg straight out to the side so that foot is a few inches of the floor, start with 5x each side and work up to 20x each side   3) stand on one foot, start with 5 seconds each side and work up to 20 seconds on each side  If you need ideas or help with getting more active:  -Silver sneakers https://tools.silversneakers.com  -Walk with a Doc: http://www.duncan-williams.com/  -try to include resistance (weight lifting/strength building) and balance exercises twice per week: or the following link for ideas: http://castillo-powell.com/  BuyDucts.dk  STRESS MANAGEMENT: -can try meditating, or just sitting quietly with deep breathing while intentionally relaxing all parts of your body for 5 minutes daily -if you need further help with stress, anxiety or depression please follow up with your primary doctor or contact the wonderful folks at [facility]: [phone]  SOCIAL CONNECTIONS: -options in [place] if you wish to engage in more social and exercise related activities:  -Silver sneakers https://tools.silversneakers.com  -Walk with a Doc: http://www.duncan-williams.com/  -Check out the [facility] Active Adults 50+ section on the [place] of [employer] (hiking clubs, book clubs, cards and games, chess, exercise classes, aquatic classes and much more) - see the website for details: https://www.[place].gov/departments/parks-recreation/active-adults50  -YouTube has lots of exercise videos for different ages and abilities as well  -[facility] (a variety of indoor and outdoor inperson activities for adults). [phone]. [address].  -Virtual Online Classes (a variety of topics): see seniorplanet.org or  call [phone]  -consider volunteering at a school, hospice center, church, senior center or elsewhere

## 2024-08-06 ENCOUNTER — Ambulatory Visit (HOSPITAL_COMMUNITY)
Admission: EM | Admit: 2024-08-06 | Discharge: 2024-08-06 | Disposition: A | Discharge status: Home / Self Care | Attending: Internal Medicine | Admitting: Internal Medicine

## 2024-08-06 ENCOUNTER — Ambulatory Visit: Payer: Self-pay

## 2024-08-06 ENCOUNTER — Encounter (HOSPITAL_COMMUNITY): Payer: Self-pay

## 2024-08-06 ENCOUNTER — Ambulatory Visit (INDEPENDENT_AMBULATORY_CARE_PROVIDER_SITE_OTHER)

## 2024-08-06 ENCOUNTER — Ambulatory Visit (HOSPITAL_COMMUNITY): Payer: Self-pay | Admitting: Internal Medicine

## 2024-08-06 DIAGNOSIS — R0602 Shortness of breath: Secondary | ICD-10-CM

## 2024-08-06 DIAGNOSIS — M7989 Other specified soft tissue disorders: Secondary | ICD-10-CM | POA: Diagnosis not present

## 2024-08-06 DIAGNOSIS — J949 Pleural condition, unspecified: Secondary | ICD-10-CM | POA: Diagnosis not present

## 2024-08-06 DIAGNOSIS — J449 Chronic obstructive pulmonary disease, unspecified: Secondary | ICD-10-CM | POA: Diagnosis not present

## 2024-08-06 NOTE — Telephone Encounter (Signed)
 FYI Only or Action Required?: FYI only for provider: No appts in pt region today, advised UC.  Patient was last seen in primary care on 03/20/2024 by Luke Chiquita SAUNDERS, DO.  Called Nurse Triage reporting Foot Swelling.  Symptoms began a week ago.  Interventions attempted: Prescription medications: Colchicine .  Symptoms are: gradually worsening.  Triage Disposition: See HCP Within 4 Hours (Or PCP Triage)  Patient/caregiver understands and will follow disposition?: Yes  Copied from CRM [id number]. Topic: Clinical - Red Word Triage >> Aug 06, 2024  2:32 PM Marc Flores wrote: Red Word that prompted transfer to Nurse Triage: Pt has swelling in both ankles and feet. Warm transfer to NT. Reason for Disposition  [1] Thigh, calf, or ankle swelling AND [2] bilateral AND [3] 1 side is more swollen  Answer Assessment - Initial Assessment Questions New onset swelling of both feet and ankles x1 week. Right moderate, left severe. Denies CP redness or fever. Has COPD with baseline SOB with exertion, has worsened some over the past 2 weeks. Denies SOB at rest. Pt speaking in clear full continuous sentences and breathing sounds non-labored. Denies coughing up blood. Mild pain in both feet, pt thinks it may be r/t hx of gout. Has had some swelling of toes in the past r/t gout but never swelling to this degree of both feet and ankles. Denies hx of DVT or CHF. Noted to have started new med amlodipine 2-3 weeks ago. No appts at any offices within pt region today. Advised UC today. Explained risks of possible blood clot and risks of delaying care. Pt agreeable to go to UC today.   1. ONSET: When did the swelling start? (e.g., minutes, hours, days)     1 week ago  2. LOCATION: What part of the leg is swollen?  Are both legs swollen or just one leg?     Both feet and ankles, left worse than right  3. SEVERITY: How bad is the swelling? (e.g., localized; mild, moderate, severe)     Moderate swelling on right,  severe on left  4. REDNESS: Is there redness or signs of infection?     Denies  5. PAIN: Is the swelling painful to touch? If Yes, ask: How painful is it?   (Scale 1-10; mild, moderate or severe)     Mild pain in both feet, improved with colchicine . Swelling persists.  6. FEVER: Do you have a fever? If Yes, ask: What is it, how was it measured, and when did it start?      Denies  7. CAUSE: What do you think is causing the leg swelling?     Gout   8. MEDICAL HISTORY: Do you have a history of blood clots (e.g., DVT), cancer, heart failure, kidney disease, or liver failure?     Gout.COPD. Denies hx of DVT or CHF  9. RECURRENT SYMPTOM: Have you had leg swelling before? If Yes, ask: When was the last time? What happened that time?     Hx of swelling of the toes in the past with gout attacks  10. OTHER SYMPTOMS: Do you have any other symptoms? (e.g., chest pain, difficulty breathing)       Denies CP or SOB.  Protocols used: Leg Swelling and Edema-A-AH

## 2024-08-06 NOTE — ED Provider Notes (Signed)
 [facility]    CSN: [id number] Arrival date & time: 08/06/24  1554      History   Chief Complaint Chief Complaint  Patient presents with   Foot Swelling    HPI Marc Flores is a 83 y.o. male.   83 year old male who presents urgent care with complaints of bilateral feet swelling.  He reports this started about 3 weeks ago.  He thought it might be gout so he has been taking his colchicine  without relief.  He denies any history of this in the past.  He does relate that his shortness of breath has been getting worse even with just walking around the house.  He reports that the swelling that he has is isolated to his feet and really does not extend much into his ankles.  He denies any calf pain.  He denies any fevers, chills, abdominal pain, nausea, vomiting, sick contacts, excessive standing.  He does relate that he was started on amlodipine 3 weeks ago.     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 Darlean     Diverticulitis    Dyspnea    ON EXERTION   History of chickenpox    Hyperlipidemia     Patient Active Problem List   Diagnosis Date Noted   Primary hypertension 11/16/2023   Benign prostatic hyperplasia without urinary obstruction 09/23/2023   GERD (gastroesophageal reflux disease) 09/23/2023   COPD exacerbation (HCC) 09/23/2023   Acute respiratory failure with hypoxia (HCC) 09/23/2023   OSA (obstructive sleep apnea) 07/11/2023   Exercise hypoxemia 11/21/2022   Gout 05/11/2020   Dyslipidemia 05/11/2020   Bronchiectasis without acute exacerbation (HCC) 12/29/2016   DOE (dyspnea on exertion) 12/08/2011   COPD GOLD III  12/08/2011    Past Surgical History:  Procedure Laterality Date   COLONOSCOPY  04/13/2020   per Dr. 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 Authorizing Provider  albuterol  (VENTOLIN  HFA) 108 (90 Base) MCG/ACT inhaler Inhale 2 puffs into the lungs every 6 (six) hours as needed for shortness of breath or wheezing.    [provider]  atorvastatin  (LIPITOR) 10 MG tablet TAKE 1 TABLET(10 MG) BY MOUTH DAILY 03/29/23   Johnny Senior LABOR, MD  cetirizine  (ZYRTEC  ALLERGY ) 10 MG tablet Take 1 tablet (10 mg total) by mouth daily. 12/14/23   Palumbo, April, MD  cholecalciferol  (VITAMIN D3) 25 MCG (1000 UNIT) tablet Take 1,000 Units by mouth daily.    [provider]  colchicine  0.6 MG tablet TAKE 2 TABLETS BY MOUTH TWICE DAILY AS NEEDED FOR GOUT FLARE 01/01/23   Johnny,  Garnette LABOR, MD  famotidine  (PEPCID ) 20 MG tablet One after supper Patient taking differently: Take 20 mg by mouth daily after breakfast. 02/13/23   Darlean Ozell NOVAK, MD  ipratropium-albuterol  (DUONEB) 0.5-2.5 (3) MG/3ML SOLN Take 3 mLs by nebulization 2 (two) times daily. 05/31/23   [provider]  metoprolol  succinate (TOPROL -XL) 25 MG 24 hr tablet Take 1 tablet (25 mg total) by mouth daily. 11/16/23   Johnny Garnette LABOR, MD  mometasone  (ASMANEX ) 220 MCG/ACT inhaler Inhale 2 puffs into the lungs at bedtime. 07/12/23   [provider]  montelukast  (SINGULAIR ) 10 MG tablet Take 10 mg by  mouth daily. 05/31/23   [provider]  pantoprazole  (PROTONIX ) 40 MG tablet TAKE 1 TABLET(40 MG) BY MOUTH DAILY 30 TO 60 MINUTES BEFORE FIRST MEAL OF THE DAY 04/30/23   Darlean Ozell NOVAK, MD  tamsulosin  (FLOMAX ) 0.4 MG CAPS capsule Take 0.4 mg by mouth daily after supper. 08/21/18   [provider]  Tiotropium Bromide-Olodaterol 2.5-2.5 MCG/ACT AERS Take 2 puffs by mouth daily. 07/12/23   [provider]    Family History Family History  Problem Relation Age of Onset   Glaucoma Mother    Diabetes Mother    Hearing loss Mother    Heart disease Mother    Heart disease Father    Atrial fibrillation Father    Arthritis Father    Cancer Father    Hearing loss Father    Hypertension Father     Social History Social History   Tobacco Use   Smoking status: Former    Current packs/day: 0.00    Average packs/day: 1 pack/day for 30.0 years (30.0 ttl pk-yrs)    Types: Cigarettes    Start date: 12/07/1961    Quit date: 12/08/1991    Years since quitting: 32.6    Passive exposure: Past   Smokeless tobacco: Never  Vaping Use   Vaping status: Never Used  Substance Use Topics   Alcohol use: Yes   Drug use: No     Allergies   Patient has no known allergies.   Review of Systems Review of Systems  Constitutional:  Negative for chills and fever.  HENT:  Negative for ear pain and sore throat.   Eyes:  Negative for pain and visual disturbance.  Respiratory:  Positive for shortness of breath. Negative for cough.   Cardiovascular:  Positive for leg swelling (bilateral foot edema). Negative for chest pain and palpitations.  Gastrointestinal:  Negative for abdominal pain and vomiting.  Genitourinary:  Negative for dysuria and hematuria.  Musculoskeletal:  Negative for arthralgias and back pain.  Skin:  Negative for color change and rash.  Neurological:  Negative for seizures and syncope.  All other systems reviewed and are negative.    Physical Exam Triage  Vital Signs ED Triage Vitals  Encounter Vitals Group     BP 08/06/24 1635 (!) 159/80     Girls Systolic BP Percentile --      Girls Diastolic BP Percentile --      Boys Systolic BP Percentile --      Boys Diastolic BP Percentile --      Pulse Rate 08/06/24 1635 74     Resp 08/06/24 1635 18     Temp 08/06/24 1635 98 F (36.7 C)     Temp Source 08/06/24 1635 Oral     SpO2 08/06/24 1635 92 %     Weight --      Height --  Head Circumference --      Peak Flow --      Pain Score 08/06/24 1634 0     Pain Loc --      Pain Education --      Exclude from Growth Chart --    No data found.  Updated Vital Signs BP (!) 159/80 (BP Location: Right Arm)   Pulse 74   Temp 98 F (36.7 C) (Oral)   Resp 18   SpO2 92%   Visual Acuity Right Eye Distance:   Left Eye Distance:   Bilateral Distance:    Right Eye Near:   Left Eye Near:    Bilateral Near:     Physical Exam Vitals and nursing note reviewed.  Constitutional:      General: He is not in acute distress.    Appearance: He is well-developed.  HENT:     Head: Normocephalic and atraumatic.  Eyes:     Conjunctiva/sclera: Conjunctivae normal.  Cardiovascular:     Rate and Rhythm: Normal rate and regular rhythm.     Pulses:          Dorsalis pedis pulses are 2+ on the right side and 2+ on the left side.       Posterior tibial pulses are 2+ on the right side and 2+ on the left side.     Heart sounds: Heart sounds are distant. No murmur heard. Pulmonary:     Effort: Pulmonary effort is normal. No tachypnea or respiratory distress.     Breath sounds: Examination of the right-upper field reveals decreased breath sounds. Examination of the left-upper field reveals decreased breath sounds. Examination of the right-middle field reveals decreased breath sounds. Examination of the left-middle field reveals decreased breath sounds. Examination of the right-lower field reveals wheezing. Decreased breath sounds and wheezing present.      Comments: Although lung sounds are distant, they are clear with the exception of a slight wheeze in the right lower. Abdominal:     Palpations: Abdomen is soft.     Tenderness: There is no abdominal tenderness.  Musculoskeletal:        General: No swelling.     Cervical back: Neck supple.  Feet:     Comments: Edema is mostly isolated to the feet, minimal edema extending into the ankles, no edema in the calves, no tenderness in the calves.  No tenderness in the feet. Skin:    General: Skin is warm and dry.     Capillary Refill: Capillary refill takes less than 2 seconds.  Neurological:     Mental Status: He is alert.  Psychiatric:        Mood and Affect: Mood normal.      UC Treatments / Results  Labs (all labs ordered are listed, but only abnormal results are displayed) Labs Reviewed - No data to display  EKG   Radiology No results found.  Procedures Procedures (including critical care time)  Medications Ordered in UC Medications - No data to display  Initial Impression / Assessment and Plan / UC Course  I have reviewed the triage vital signs and the nursing notes.  Pertinent labs & imaging results that were available during my care of the patient were reviewed by me and considered in my medical decision making (see chart for details).     Bilateral swelling of feet - Plan: DG Chest 2 View, DG Chest 2 View  Shortness of breath - Plan: DG Chest 2 View, DG Chest 2 View  EKG done today.  This shows normal sinus rhythm.  Chest x-ray done today.  Final evaluation by the radiologist is still pending but when compared to previous chest x-ray there does not appear to be any significant changes.  Overall physical exam and vital signs seem to be stable when compared to your previous office visits with your other providers.  We will order an outpatient lower extremity ultrasound to rule out a deep vein thrombosis however given the recent initiation of amlodipine along with  swelling occurring shortly after this, we feel that this is likely a side effect from the amlodipine.  Recommend contacting the provider that prescribed this medication to notify them of the swelling and that you have been evaluated at urgent care for this.  If you feel your symptoms are worsening significantly then would recommend going to the emergency room for further evaluation.  Final Clinical Impressions(s) / UC Diagnoses   Final diagnoses:  Shortness of breath  Bilateral swelling of feet     Discharge Instructions      EKG done today.  This shows normal sinus rhythm.  Chest x-ray done today.  Final evaluation by the radiologist is still pending but when compared to previous chest x-ray there does not appear to be any significant changes.  Overall physical exam and vital signs seem to be stable when compared to your previous office visits with your other providers.  We will order an outpatient lower extremity ultrasound to rule out a deep vein thrombosis however given the recent initiation of amlodipine along with swelling occurring shortly after this, we feel that this is likely a side effect from the amlodipine.  Recommend contacting the provider that prescribed this medication to notify them of the swelling and that you have been evaluated at urgent care for this.  If you feel your symptoms are worsening significantly then would recommend going to the emergency room for further evaluation.     ED Prescriptions   None    PDMP not reviewed this encounter.   Teresa Almarie LABOR, PA-C 08/06/24 1734

## 2024-08-06 NOTE — ED Triage Notes (Signed)
 Pt c/o swelling to both feet and ankles x3 wks. States been taken his gout meds but not helping.

## 2024-08-06 NOTE — Discharge Instructions (Addendum)
 EKG done today.  This shows normal sinus rhythm.  Chest x-ray done today.  Final evaluation by the radiologist is still pending but when compared to previous chest x-ray there does not appear to be any significant changes.  Overall physical exam and vital signs seem to be stable when compared to your previous office visits with your other providers.  We will order an outpatient lower extremity ultrasound to rule out a deep vein thrombosis however given the recent initiation of amlodipine along with swelling occurring shortly after this, we feel that this is likely a side effect from the amlodipine.  Recommend contacting the provider that prescribed this medication to notify them of the swelling and that you have been evaluated at urgent care for this.  If you feel your symptoms are worsening significantly then would recommend going to the emergency room for further evaluation.

## 2024-08-07 ENCOUNTER — Ambulatory Visit (HOSPITAL_COMMUNITY)
Admission: RE | Admit: 2024-08-07 | Discharge: 2024-08-07 | Disposition: A | Source: Ambulatory Visit | Attending: Internal Medicine | Admitting: Internal Medicine

## 2024-08-07 DIAGNOSIS — R2243 Localized swelling, mass and lump, lower limb, bilateral: Secondary | ICD-10-CM | POA: Diagnosis not present

## 2024-08-07 NOTE — Telephone Encounter (Signed)
 Noted

## 2024-08-26 ENCOUNTER — Ambulatory Visit: Admitting: Family Medicine

## 2024-08-26 ENCOUNTER — Encounter: Payer: Self-pay | Admitting: Family Medicine

## 2024-08-26 VITALS — BP 146/89 | HR 100 | Temp 98.9°F | Wt 176.2 lb

## 2024-08-26 DIAGNOSIS — J069 Acute upper respiratory infection, unspecified: Secondary | ICD-10-CM

## 2024-08-26 DIAGNOSIS — R051 Acute cough: Secondary | ICD-10-CM | POA: Diagnosis not present

## 2024-08-26 DIAGNOSIS — R6889 Other general symptoms and signs: Secondary | ICD-10-CM | POA: Diagnosis not present

## 2024-08-26 LAB — POCT INFLUENZA A/B
Influenza A, POC: NEGATIVE
Influenza B, POC: NEGATIVE

## 2024-08-26 LAB — POC COVID19 BINAXNOW: SARS Coronavirus 2 Ag: NEGATIVE

## 2024-08-26 MED ORDER — DOXYCYCLINE HYCLATE 100 MG PO TABS: 100.0000 mg | ORAL_TABLET | Freq: Two times a day (BID) | ORAL | 0 refills | Status: AC

## 2024-08-26 MED ORDER — PREDNISONE 10 MG PO TABS: ORAL_TABLET | ORAL | 0 refills | Status: AC

## 2024-08-26 NOTE — Patient Instructions (Signed)
-  It was a pleasure to care for you. -Rapid influenza and covid are negative.  -Prescribed Prednisone  10mg , 6 day taper for upper respiratory infection with wheezing.  -Concerned with a history of COPD that he is developing pneumonia, prescribed Doxycycline  100mg  tablet, take 1 tablet twice a day for 7 days.  -Rest, hydrate. -Discussed about when to receive emergent care if symptoms become worse. -Follow up with Dr. Johnny for a chronic management visit.

## 2024-08-26 NOTE — Progress Notes (Signed)
 "  Acute Office Visit   Subjective:  Patient ID: Marc Flores, male    DOB: 02/14/1941, 83 y.o.   MRN: [id number]  Chief Complaint  Patient presents with   Sinus Problem    Congested sore throat, sinus pressure x 2 days     Sinus Problem   Patient is here for an acute visit. His primary care is Dr. Garnette Olmsted and unable to see him due to schedule availability.   He is having the following symptoms Sore throat Productive cough-greenish gray, thick  Sinus pressure  Nasal drainage-clear  SHOB-worse with having a history of COPD  Wheezing-not been using his inhalers more often   Denies headaches, CP, fever, ear pain or drainage, HA, nausea, or vomiting.   Started 2 days ago.  He has tried Nyquil with some relief.   ROS See HPI above      Objective:    BP (!) 146/89   Pulse 100   Temp 98.9 F (37.2 C)   Wt 176 lb 3.2 oz (79.9 kg)   SpO2 90%   BMI 28.44 kg/m    Physical Exam Vitals reviewed.  Constitutional:      General: He is not in acute distress.    Appearance: Normal appearance. He is overweight. He is not ill-appearing, toxic-appearing or diaphoretic.  HENT:     Head: Normocephalic and atraumatic.     Right Ear: There is impacted cerumen.     Left Ear: There is impacted cerumen.     Nose:     Right Sinus: No maxillary sinus tenderness or frontal sinus tenderness.     Left Sinus: No maxillary sinus tenderness or frontal sinus tenderness.     Mouth/Throat:     Pharynx: Oropharynx is clear. Uvula midline. No pharyngeal swelling, oropharyngeal exudate, posterior oropharyngeal erythema or uvula swelling.  Eyes:     General:        Right eye: No discharge.        Left eye: No discharge.     Conjunctiva/sclera: Conjunctivae normal.  Cardiovascular:     Rate and Rhythm: Normal rate and regular rhythm.     Heart sounds: Normal heart sounds. No murmur heard.    No friction rub. No gallop.  Pulmonary:     Effort: Pulmonary effort is normal. No  respiratory distress.     Breath sounds: Wheezing present.  Musculoskeletal:        General: Normal range of motion.  Skin:    General: Skin is warm and dry.  Neurological:     General: No focal deficit present.     Mental Status: He is alert and oriented to person, place, and time. Mental status is at baseline.  Psychiatric:        Mood and Affect: Mood normal.        Behavior: Behavior normal.        Thought Content: Thought content normal.        Judgment: Judgment normal.    Results for orders placed or performed in visit on 08/26/24  POC Influenza A/B  Result Value Ref Range   Influenza A, POC Negative Negative   Influenza B, POC Negative Negative  POC COVID-19  Result Value Ref Range   SARS Coronavirus 2 Ag Negative Negative       Assessment & Plan:  Acute cough -     POCT Influenza A/B -     POC COVID-19 BinaxNow  Flu-like symptoms -  POCT Influenza A/B -     POC COVID-19 BinaxNow  Upper respiratory tract infection, unspecified type -     predniSONE ; Take 6 tablets (60 mg total) by mouth daily with breakfast for 1 day, THEN 5 tablets (50 mg total) daily with breakfast for 1 day, THEN 4 tablets (40 mg total) daily with breakfast for 1 day, THEN 3 tablets (30 mg total) daily with breakfast for 1 day, THEN 2 tablets (20 mg total) daily with breakfast for 1 day, THEN 1 tablet (10 mg total) daily with breakfast for 1 day.  Dispense: 21 tablet; Refill: 0 -     Doxycycline  Hyclate; Take 1 tablet (100 mg total) by mouth 2 (two) times daily for 7 days.  Dispense: 14 tablet; Refill: 0  -Rapid influenza and covid are negative.  -Prescribed Prednisone  10mg , 6 day taper for upper respiratory infection with wheezing.  -Concerned with a history of COPD that he is developing pneumonia, prescribed Doxycycline  100mg  tablet, take 1 tablet twice a day for 7 days.  -Rest, hydrate. -Discussed about when to receive emergent care if symptoms become worse. -Follow up with Dr. Johnny for a  chronic management visit.  Return for chronic management-Dr Johnny. .  Reilynn Lauro, NP "

## 2024-09-26 ENCOUNTER — Ambulatory Visit (INDEPENDENT_AMBULATORY_CARE_PROVIDER_SITE_OTHER): Admitting: Family Medicine

## 2024-09-26 ENCOUNTER — Encounter: Payer: Self-pay | Admitting: Family Medicine

## 2024-09-26 VITALS — BP 124/74 | HR 46 | Temp 97.8°F | Wt 182.0 lb

## 2024-09-26 DIAGNOSIS — H6123 Impacted cerumen, bilateral: Secondary | ICD-10-CM | POA: Diagnosis not present

## 2024-09-26 NOTE — Progress Notes (Signed)
" ° °  Subjective:    Patient ID: Marc Flores, male    DOB: 10-15-1940, 84 y.o.   MRN: [id number]  HPI Here for wax in both ears. He has not noticed any hearing changes or ear discomfort. He was seen here on 08-26-24 for a URI, and he was treated with Doxycycline  and Prednisone . This was successful, and now he feels fine with no coughing. His exam that day also revealed cerumen in both ears, so he is here to have them cleaned out.    Review of Systems  Constitutional: Negative.   HENT: Negative.    Eyes: Negative.   Respiratory: Negative.    Cardiovascular: Negative.        Objective:   Physical Exam Constitutional:      Appearance: Normal appearance.  HENT:     Right Ear: There is impacted cerumen.     Left Ear: There is impacted cerumen.     Nose: No congestion.     Mouth/Throat:     Pharynx: No oropharyngeal exudate.  Eyes:     Conjunctiva/sclera: Conjunctivae normal.  Cardiovascular:     Rate and Rhythm: Normal rate and regular rhythm.     Pulses: Normal pulses.     Heart sounds: Normal heart sounds.  Pulmonary:     Effort: Pulmonary effort is normal.     Breath sounds: Normal breath sounds.  Neurological:     Mental Status: He is alert.           Assessment & Plan:  Bilateral cerumen impactions. After informed consent was obtained, we attempted to irrigate the right ear canal with water. This was quite painful for him so we stopped before any cerumen could be removed. Instead, with his agreement, we will refer him to ENT for removal. Garnette Olmsted, MD   "

## 2024-10-10 ENCOUNTER — Ambulatory Visit (INDEPENDENT_AMBULATORY_CARE_PROVIDER_SITE_OTHER): Admitting: Physician Assistant

## 2024-10-10 ENCOUNTER — Encounter (INDEPENDENT_AMBULATORY_CARE_PROVIDER_SITE_OTHER): Payer: Self-pay | Admitting: Physician Assistant

## 2024-10-10 VITALS — BP 147/71 | HR 79 | Temp 97.2°F | Ht 66.0 in | Wt 180.0 lb

## 2024-10-10 DIAGNOSIS — H6123 Impacted cerumen, bilateral: Secondary | ICD-10-CM

## 2024-10-10 NOTE — Progress Notes (Signed)
 Patient has COPD. Patient took BP medication last night at around 8pm.

## 2024-10-10 NOTE — Progress Notes (Signed)
 Dear Dr. Johnny, Here is my assessment for our mutual patient, Marc Flores. Thank you for allowing me the opportunity to care for your patient. Please do not hesitate to contact me should you have any other questions. Sincerely, Marc Cohen PA-C  Otolaryngology Clinic Note Referring provider: Dr. Johnny HPI:  Marc Flores is a 84 y.o. male kindly referred by Dr. Johnny   Discussed the use of AI scribe software for clinical note transcription with the patient, who gave verbal consent to proceed.  History of Present Illness   Marc Flores is an 84 year old male who presents with cerumen impaction refractory to primary care management.  He initially developed symptoms of an upper respiratory infection and was evaluated by his primary care provider, at which time cerumen impaction was identified. Multiple removal attempts by his primary care provider were unsuccessful and resulted in transient otalgia.  Currently, he denies otalgia, hearing loss, or otorrhea. He reports no change in hearing following the attempted cerumen removal. He has not used cerumenolytic agents or other home interventions, and exposure to hot water in the shower has not improved the impaction. He has no history of otologic surgery and no other otologic complaints.           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 Darlean     Diverticulitis    Dyspnea    ON EXERTION   History of chickenpox    Hyperlipidemia      Past Surgical History:  Procedure Laterality Date   COLONOSCOPY  04/13/2020   per Dr. 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 -C N/A 12/18/2019   Procedure: TRANSURETHRAL RESECTION OF BLADDER TUMOR, RIGHT URETEROSCOPY AND RIGHT URETERAL STENT PLACEMENT;  Surgeon: Matilda Senior, MD;  Location: [facility];  Service: Urology;  Laterality: N/A;   WISDOM TOOTH EXTRACTION  AS CHILD    Family History  Problem Relation Age of Onset   Glaucoma Mother    Diabetes Mother    Hearing loss Mother    Heart disease Mother    Heart disease Father    Atrial fibrillation Father    Arthritis Father    Cancer Father    Hearing loss Father    Hypertension Father      Social Connections: Moderately Isolated (11/09/2023)   Social Connection and Isolation Panel    Frequency of Communication with Friends and Family: Twice a week    Frequency of Social Gatherings with Friends and Family: Once a week    Attends Religious Services: Never    Database Administrator or Organizations: No    Attends Banker Meetings: Never    Marital Status: Married     Current Medications[1]   Physical Exam:   BP (!) 147/71   Pulse 79   Temp (!) 97.2 F (36.2 C)   Ht 5' 6 (1.676 m)  Wt 180 lb (81.6 kg)   SpO2 (!) 89%   BMI 29.05 kg/m   Pertinent Findings  CN II-XII grossly intact Bilateral cerumen impaction Anterior rhinoscopy: Septum midline; bilateral inferior turbinates with no hypertrophy No lesions of oral cavity/oropharynx No obviously palpable neck masses/lymphadenopathy/thyromegaly No respiratory distress or stridor   Seprately Identifiable Procedures:  Procedure: Bilateral ear microscopy and cerumen removal using microscope (CPT [id number]) - Mod 50 Pre-procedure diagnosis: bilateral cerumen impaction external auditory canals Post-procedure diagnosis: same Indication:  bilateral cerumen impaction; given patient's otologic complaints and history as well as for improved and comprehensive examination of external ear and tympanic membrane, bilateral otologic examination using microscope was performed and impacted cerumen removed  Procedure: Patient was placed semi-recumbent. Both ear canals were examined using the microscope with findings above. Cerumen removed from bilateral external auditory canals using suction and currette with improvement in EAC examination and patency. Left: EAC was patent. TM was intact . Middle ear was aerated. Drainage: none Right: EAC was patent. TM was intact . Middle ear was aerated . Drainage: none Patient tolerated the procedure well.   Impression & Plans:  Marc Flores is a 84 y.o. male with the following   Assessment and Plan    Impacted cerumen  - Removed impacted cerumen - Advised him to avoid inserting objects into the ear canal or attempting self-cleaning. - Recommended follow-up in 6 months for re-evaluation, or sooner if symptoms recur or as directed by his primary care provider. - Informed him that as an established patient, he may return directly for future otolaryngology concerns without a referral.           - f/u 6 months    Thank you for allowing me the opportunity to care for your patient. Please do not hesitate to contact me should you have any other questions.  Sincerely, Marc Cohen PA-C [facility] Phone: [phone] Fax: [phone]  10/10/2024, 12:51 PM        [1]  Current Outpatient Medications:    albuterol  (VENTOLIN  HFA) 108 (90 Base) MCG/ACT inhaler, Inhale 2 puffs into the lungs every 6 (six) hours as needed for shortness of breath or wheezing., Disp: , Rfl:    atorvastatin  (LIPITOR) 10 MG tablet, TAKE 1 TABLET(10 MG) BY MOUTH DAILY, Disp: 90 tablet, Rfl: 0   cetirizine  (ZYRTEC  ALLERGY ) 10 MG tablet, Take 1 tablet (10 mg total) by mouth daily., Disp: 30 tablet, Rfl:  0   cholecalciferol  (VITAMIN D3) 25 MCG (1000 UNIT) tablet, Take 1,000 Units by mouth daily., Disp: , Rfl:    colchicine  0.6 MG tablet, TAKE 2 TABLETS BY MOUTH TWICE DAILY AS NEEDED FOR GOUT FLARE, Disp: 360 tablet, Rfl: 0   famotidine  (PEPCID ) 20 MG tablet, One after supper, Disp: 30 tablet, Rfl: 11   ipratropium-albuterol  (DUONEB) 0.5-2.5 (3) MG/3ML SOLN, Take 3 mLs by nebulization 2 (two) times daily., Disp: , Rfl:    metoprolol  succinate (TOPROL -XL) 25 MG 24 hr tablet, Take 1 tablet (25 mg total) by mouth daily., Disp: 90 tablet, Rfl: 3   mometasone  (ASMANEX ) 220 MCG/ACT inhaler, Inhale 2 puffs into the lungs at bedtime., Disp: , Rfl:    montelukast  (SINGULAIR ) 10 MG tablet, Take 10 mg by mouth daily., Disp: , Rfl:    pantoprazole  (PROTONIX ) 40 MG tablet, TAKE 1 TABLET(40 MG) BY MOUTH DAILY 30 TO 60 MINUTES BEFORE FIRST MEAL OF THE DAY, Disp: 30 tablet, Rfl: 5   tamsulosin  (FLOMAX ) 0.4 MG CAPS capsule, Take 0.4 mg by mouth  daily after supper., Disp: , Rfl:    Tiotropium Bromide-Olodaterol 2.5-2.5 MCG/ACT AERS, Take 2 puffs by mouth daily., Disp: , Rfl:    vitamin B-12 (CYANOCOBALAMIN) 250 MCG tablet, Take 250 mcg by mouth daily., Disp: , Rfl:  No current facility-administered medications for this visit.  Facility-Administered Medications Ordered in Other Visits:    gemcitabine  (GEMZAR ) chemo syringe for bladder instillation 2,000 mg, 2,000 mg, Bladder Instillation, Once, Matilda Senior, MD

## 2025-04-10 ENCOUNTER — Ambulatory Visit (INDEPENDENT_AMBULATORY_CARE_PROVIDER_SITE_OTHER): Admitting: Physician Assistant
